# Patient Record
Sex: Female | Born: 1975 | Race: Black or African American | Hispanic: No | Marital: Single | Smoking: Current every day smoker
Health system: Southern US, Community
[De-identification: ages and names within clinical notes are randomized; demographics above are authoritative.]

## PROBLEM LIST (undated history)

## (undated) DIAGNOSIS — M1990 Unspecified osteoarthritis, unspecified site: Secondary | ICD-10-CM

## (undated) DIAGNOSIS — G43909 Migraine, unspecified, not intractable, without status migrainosus: Secondary | ICD-10-CM

## (undated) DIAGNOSIS — R42 Dizziness and giddiness: Secondary | ICD-10-CM

## (undated) DIAGNOSIS — I10 Essential (primary) hypertension: Secondary | ICD-10-CM

## (undated) DIAGNOSIS — D869 Sarcoidosis, unspecified: Secondary | ICD-10-CM

## (undated) DIAGNOSIS — K219 Gastro-esophageal reflux disease without esophagitis: Secondary | ICD-10-CM

## (undated) DIAGNOSIS — D509 Iron deficiency anemia, unspecified: Secondary | ICD-10-CM

## (undated) DIAGNOSIS — K5900 Constipation, unspecified: Secondary | ICD-10-CM

## (undated) HISTORY — PX: OTHER SURGICAL HISTORY: SHX169

## (undated) HISTORY — DX: Iron deficiency anemia, unspecified: D50.9

## (undated) HISTORY — DX: Gastro-esophageal reflux disease without esophagitis: K21.9

## (undated) HISTORY — PX: KNEE SURGERY: SHX244

## (undated) HISTORY — PX: TUBAL LIGATION: SHX77

## (undated) HISTORY — DX: Unspecified osteoarthritis, unspecified site: M19.90

## (undated) HISTORY — DX: Constipation, unspecified: K59.00

---

## 2001-11-28 ENCOUNTER — Inpatient Hospital Stay (HOSPITAL_COMMUNITY): Admission: AD | Admit: 2001-11-28 | Discharge: 2001-11-28 | Payer: Self-pay | Admitting: Obstetrics

## 2001-12-06 ENCOUNTER — Encounter: Payer: Self-pay | Admitting: Obstetrics & Gynecology

## 2001-12-06 ENCOUNTER — Emergency Department (HOSPITAL_COMMUNITY): Admission: EM | Admit: 2001-12-06 | Discharge: 2001-12-06 | Payer: Self-pay | Admitting: Emergency Medicine

## 2001-12-10 ENCOUNTER — Emergency Department (HOSPITAL_COMMUNITY): Admission: EM | Admit: 2001-12-10 | Discharge: 2001-12-10 | Payer: Self-pay | Admitting: Emergency Medicine

## 2001-12-10 ENCOUNTER — Encounter: Payer: Self-pay | Admitting: Emergency Medicine

## 2004-05-26 ENCOUNTER — Emergency Department (HOSPITAL_COMMUNITY): Admission: EM | Admit: 2004-05-26 | Discharge: 2004-05-26 | Payer: Self-pay | Admitting: Family Medicine

## 2005-02-07 ENCOUNTER — Emergency Department (HOSPITAL_COMMUNITY): Admission: EM | Admit: 2005-02-07 | Discharge: 2005-02-07 | Payer: Self-pay | Admitting: Family Medicine

## 2005-07-08 ENCOUNTER — Emergency Department (HOSPITAL_COMMUNITY): Admission: EM | Admit: 2005-07-08 | Discharge: 2005-07-08 | Payer: Self-pay | Admitting: Family Medicine

## 2007-09-08 ENCOUNTER — Emergency Department (HOSPITAL_COMMUNITY): Admission: EM | Admit: 2007-09-08 | Discharge: 2007-09-08 | Payer: Self-pay | Admitting: Emergency Medicine

## 2008-02-01 ENCOUNTER — Emergency Department (HOSPITAL_COMMUNITY): Admission: EM | Admit: 2008-02-01 | Discharge: 2008-02-01 | Payer: Self-pay | Admitting: Emergency Medicine

## 2008-04-19 ENCOUNTER — Emergency Department (HOSPITAL_COMMUNITY): Admission: EM | Admit: 2008-04-19 | Discharge: 2008-04-19 | Payer: Self-pay | Admitting: Emergency Medicine

## 2008-05-18 ENCOUNTER — Emergency Department (HOSPITAL_COMMUNITY): Admission: EM | Admit: 2008-05-18 | Discharge: 2008-05-18 | Payer: Self-pay | Admitting: Family Medicine

## 2008-10-31 ENCOUNTER — Emergency Department (HOSPITAL_COMMUNITY): Admission: EM | Admit: 2008-10-31 | Discharge: 2008-10-31 | Payer: Self-pay | Admitting: Family Medicine

## 2009-03-28 ENCOUNTER — Emergency Department (HOSPITAL_COMMUNITY): Admission: EM | Admit: 2009-03-28 | Discharge: 2009-03-28 | Payer: Self-pay | Admitting: Emergency Medicine

## 2009-06-02 ENCOUNTER — Emergency Department (HOSPITAL_COMMUNITY): Admission: EM | Admit: 2009-06-02 | Discharge: 2009-06-02 | Payer: Self-pay | Admitting: Emergency Medicine

## 2009-08-14 ENCOUNTER — Emergency Department (HOSPITAL_COMMUNITY): Admission: EM | Admit: 2009-08-14 | Discharge: 2009-08-14 | Payer: Self-pay | Admitting: Emergency Medicine

## 2009-10-20 ENCOUNTER — Emergency Department (HOSPITAL_COMMUNITY): Admission: EM | Admit: 2009-10-20 | Discharge: 2009-10-20 | Payer: Self-pay | Admitting: Emergency Medicine

## 2009-10-25 ENCOUNTER — Emergency Department (HOSPITAL_COMMUNITY): Admission: EM | Admit: 2009-10-25 | Discharge: 2009-10-25 | Payer: Self-pay | Admitting: Emergency Medicine

## 2010-01-31 ENCOUNTER — Emergency Department (HOSPITAL_COMMUNITY): Admission: EM | Admit: 2010-01-31 | Discharge: 2010-01-31 | Payer: Self-pay | Admitting: Emergency Medicine

## 2010-03-31 ENCOUNTER — Emergency Department (HOSPITAL_COMMUNITY): Admission: EM | Admit: 2010-03-31 | Discharge: 2010-03-31 | Payer: Self-pay | Admitting: Family Medicine

## 2011-02-22 LAB — RAPID URINE DRUG SCREEN, HOSP PERFORMED: Tetrahydrocannabinol: NOT DETECTED

## 2011-02-22 LAB — URINALYSIS, ROUTINE W REFLEX MICROSCOPIC
Bilirubin Urine: NEGATIVE
Ketones, ur: NEGATIVE mg/dL
Nitrite: NEGATIVE
Urobilinogen, UA: 1 mg/dL (ref 0.0–1.0)

## 2011-02-22 LAB — POCT I-STAT, CHEM 8
Calcium, Ion: 1.21 mmol/L (ref 1.12–1.32)
Chloride: 100 mEq/L (ref 96–112)
HCT: 33 % — ABNORMAL LOW (ref 36.0–46.0)
Hemoglobin: 11.2 g/dL — ABNORMAL LOW (ref 12.0–15.0)
Potassium: 4 mEq/L (ref 3.5–5.1)

## 2011-02-22 LAB — POCT PREGNANCY, URINE: Preg Test, Ur: NEGATIVE

## 2011-02-23 LAB — URINALYSIS, ROUTINE W REFLEX MICROSCOPIC
Nitrite: NEGATIVE
Protein, ur: 30 mg/dL — AB
Specific Gravity, Urine: 1.029 (ref 1.005–1.030)
Urobilinogen, UA: 1 mg/dL (ref 0.0–1.0)

## 2011-02-23 LAB — URINE MICROSCOPIC-ADD ON

## 2011-02-23 LAB — POCT PREGNANCY, URINE: Preg Test, Ur: NEGATIVE

## 2011-02-25 LAB — COMPREHENSIVE METABOLIC PANEL
Alkaline Phosphatase: 88 U/L (ref 39–117)
BUN: 4 mg/dL — ABNORMAL LOW (ref 6–23)
CO2: 24 mEq/L (ref 19–32)
Chloride: 106 mEq/L (ref 96–112)
Creatinine, Ser: 0.62 mg/dL (ref 0.4–1.2)
GFR calc non Af Amer: >60 mL/min (ref >60)
Glucose, Bld: 97 mg/dL (ref 70–99)
Potassium: 3.7 mEq/L (ref 3.5–5.1)
Total Bilirubin: 0.7 mg/dL (ref 0.3–1.2)

## 2011-02-25 LAB — DIFFERENTIAL
Basophils Absolute: 0 10*3/uL (ref 0.0–0.1)
Basophils Relative: 1 % (ref 0–1)
Lymphocytes Relative: 44 % (ref 12–46)
Neutro Abs: 1.9 10*3/uL (ref 1.7–7.7)
Neutrophils Relative %: 45 % (ref 43–77)

## 2011-02-25 LAB — CBC
HCT: 35.8 % — ABNORMAL LOW (ref 36.0–46.0)
Hemoglobin: 11.8 g/dL — ABNORMAL LOW (ref 12.0–15.0)
MCV: 90.1 fL (ref 78.0–100.0)
Platelets: 269 10*3/uL (ref 150–400)
WBC: 4.3 10*3/uL (ref 4.0–10.5)

## 2011-02-25 LAB — LIPASE, BLOOD: Lipase: 36 U/L (ref 11–59)

## 2011-03-01 LAB — POCT URINALYSIS DIP (DEVICE)
Glucose, UA: NEGATIVE mg/dL
Nitrite: NEGATIVE
Protein, ur: NEGATIVE mg/dL
Specific Gravity, Urine: 1.01 (ref 1.005–1.030)
Urobilinogen, UA: 1 mg/dL (ref 0.0–1.0)

## 2011-03-01 LAB — POCT PREGNANCY, URINE: Preg Test, Ur: NEGATIVE

## 2011-03-01 LAB — GC/CHLAMYDIA PROBE AMP, GENITAL
Chlamydia, DNA Probe: NEGATIVE
GC Probe Amp, Genital: NEGATIVE

## 2011-03-01 LAB — WET PREP, GENITAL
Trich, Wet Prep: NONE SEEN
Yeast Wet Prep HPF POC: NONE SEEN

## 2011-05-06 ENCOUNTER — Inpatient Hospital Stay (INDEPENDENT_AMBULATORY_CARE_PROVIDER_SITE_OTHER)
Admission: RE | Admit: 2011-05-06 | Discharge: 2011-05-06 | Disposition: A | Discharge status: Home / Self Care | Payer: Self-pay | Source: Ambulatory Visit | Attending: Family Medicine | Admitting: Family Medicine

## 2011-05-06 DIAGNOSIS — J40 Bronchitis, not specified as acute or chronic: Secondary | ICD-10-CM

## 2011-07-04 ENCOUNTER — Inpatient Hospital Stay (INDEPENDENT_AMBULATORY_CARE_PROVIDER_SITE_OTHER)
Admission: RE | Admit: 2011-07-04 | Discharge: 2011-07-04 | Disposition: A | Discharge status: Home / Self Care | Payer: Self-pay | Source: Ambulatory Visit | Attending: Emergency Medicine | Admitting: Emergency Medicine

## 2011-07-04 DIAGNOSIS — S90129A Contusion of unspecified lesser toe(s) without damage to nail, initial encounter: Secondary | ICD-10-CM

## 2011-07-04 DIAGNOSIS — S91109A Unspecified open wound of unspecified toe(s) without damage to nail, initial encounter: Secondary | ICD-10-CM

## 2011-07-15 ENCOUNTER — Inpatient Hospital Stay (HOSPITAL_COMMUNITY)
Admission: RE | Admit: 2011-07-15 | Discharge: 2011-07-15 | Disposition: A | Discharge status: Home / Self Care | Payer: Self-pay | Source: Ambulatory Visit | Attending: Family Medicine | Admitting: Family Medicine

## 2011-08-15 LAB — I-STAT 8, (EC8 V) (CONVERTED LAB)
Chloride: 105
HCT: 37
Hemoglobin: 12.6
Potassium: 3.4 — ABNORMAL LOW
Sodium: 140
TCO2: 25

## 2011-08-15 LAB — CBC
HCT: 32.8 — ABNORMAL LOW
Hemoglobin: 10.7 — ABNORMAL LOW
MCHC: 32.8
RBC: 3.79 — ABNORMAL LOW
RDW: 12.4

## 2011-08-15 LAB — URINALYSIS, ROUTINE W REFLEX MICROSCOPIC
Bilirubin Urine: NEGATIVE
Leukocytes, UA: NEGATIVE
Nitrite: NEGATIVE
Specific Gravity, Urine: 1.023
Urobilinogen, UA: 1

## 2011-08-15 LAB — DIFFERENTIAL
Eosinophils Relative: 0
Lymphocytes Relative: 25
Monocytes Absolute: 0.5
Monocytes Relative: 8
Neutro Abs: 4

## 2011-08-15 LAB — OCCULT BLOOD X 1 CARD TO LAB, STOOL: Fecal Occult Bld: NEGATIVE

## 2011-08-15 LAB — POCT I-STAT CREATININE
Creatinine, Ser: 0.8
Operator id: 270651

## 2011-08-15 LAB — URINE MICROSCOPIC-ADD ON

## 2011-08-15 LAB — HEPATIC FUNCTION PANEL
Albumin: 3.6
Alkaline Phosphatase: 83
Indirect Bilirubin: 0.6
Total Protein: 7.7

## 2011-08-15 LAB — LIPASE, BLOOD: Lipase: 35

## 2011-08-17 LAB — URINALYSIS, ROUTINE W REFLEX MICROSCOPIC
Bilirubin Urine: NEGATIVE
Ketones, ur: 15 — AB
Leukocytes, UA: NEGATIVE
Nitrite: NEGATIVE
Protein, ur: NEGATIVE
Urobilinogen, UA: 1

## 2011-08-18 LAB — URINALYSIS, ROUTINE W REFLEX MICROSCOPIC
Bilirubin Urine: NEGATIVE
Glucose, UA: NEGATIVE
Ketones, ur: 15 — AB
Specific Gravity, Urine: 1.02
pH: 6

## 2011-08-18 LAB — RAPID URINE DRUG SCREEN, HOSP PERFORMED
Amphetamines: NOT DETECTED
Barbiturates: NOT DETECTED
Opiates: NOT DETECTED
Tetrahydrocannabinol: NOT DETECTED

## 2011-08-18 LAB — URINE MICROSCOPIC-ADD ON

## 2011-08-31 LAB — POCT URINALYSIS DIP (DEVICE)
Ketones, ur: 15 — AB
Nitrite: NEGATIVE
Operator id: 270961
Protein, ur: NEGATIVE
pH: 6.5

## 2011-08-31 LAB — POCT PREGNANCY, URINE
Operator id: 282151
Preg Test, Ur: NEGATIVE

## 2012-01-23 ENCOUNTER — Emergency Department (HOSPITAL_COMMUNITY)
Admission: EM | Admit: 2012-01-23 | Discharge: 2012-01-23 | Disposition: A | Discharge status: Home Health | Payer: Medicaid Other | Source: Home / Self Care | Attending: Family Medicine | Admitting: Family Medicine

## 2012-01-23 ENCOUNTER — Encounter (HOSPITAL_COMMUNITY): Payer: Self-pay | Admitting: Emergency Medicine

## 2012-01-23 DIAGNOSIS — J069 Acute upper respiratory infection, unspecified: Secondary | ICD-10-CM

## 2012-01-23 NOTE — ED Notes (Signed)
PT HERE WITH COLD/COUGH SX THAT STARTED LAST Friday.COUGH,VOMTING,SNEEZING AND DIARRHEA THAT HAS RESOLVED.AFEBRILE.PT TAKING OTC ALKA SELTZER

## 2012-01-23 NOTE — Discharge Instructions (Signed)
Drink plenty of fluids as discussed, use mucinex or delsym for cough. Return or see your doctor if further problems °

## 2012-01-23 NOTE — ED Provider Notes (Signed)
History     CSN: [id number]  Arrival date & time 01/23/12  1352   First MD Initiated Contact with Patient 01/23/12 1457      Chief Complaint  Patient presents with  . URI    (Consider location/radiation/quality/duration/timing/severity/associated sxs/prior treatment) Patient is a 36 y.o. female presenting with URI. The history is provided by the patient.  URI The primary symptoms include sore throat, cough, nausea and vomiting. The current episode started 3 to 5 days ago. This is a new problem. The problem has been gradually improving.  The onset of the illness is associated with exposure to sick contacts. Symptoms associated with the illness include congestion and rhinorrhea.    History reviewed. No pertinent past medical history.  Past Surgical History  Procedure Date  . Cesarean section     No family history on file.  History  Substance Use Topics  . Smoking status: Current Everyday Smoker  . Smokeless tobacco: Not on file  . Alcohol Use: No    OB History    Grav Para Term Preterm Abortions TAB SAB Ect Mult Living                  Review of Systems  Constitutional: Negative.   HENT: Positive for congestion, sore throat and rhinorrhea.   Respiratory: Positive for cough.   Gastrointestinal: Positive for nausea, vomiting and diarrhea.  Genitourinary: Negative.     Allergies  Naprosyn  Home Medications  No current outpatient prescriptions on file.  BP 119/77  Pulse 60  Temp(Src) 98 F (36.7 C) (Oral)  Resp 18  SpO2 99%  Physical Exam  Nursing note and vitals reviewed. Constitutional: She is oriented to person, place, and time. She appears well-developed and well-nourished.  HENT:  Head: Normocephalic.  Right Ear: External ear normal.  Left Ear: External ear normal.  Mouth/Throat: Oropharynx is clear and moist.  Eyes: Pupils are equal, round, and reactive to light.  Neck: Normal range of motion. Neck supple.  Cardiovascular: Normal rate and  normal heart sounds.   Pulmonary/Chest: Breath sounds normal.  Abdominal: Soft. Bowel sounds are normal.  Lymphadenopathy:    She has no cervical adenopathy.  Neurological: She is alert and oriented to person, place, and time.  Skin: Skin is warm.    ED Course  Procedures (including critical care time)  Labs Reviewed - No data to display No results found.   1. URI (upper respiratory infection)       MDM          Barkley Bruns, MD 01/23/12 1535

## 2012-03-12 ENCOUNTER — Encounter (HOSPITAL_COMMUNITY): Payer: Self-pay | Admitting: *Deleted

## 2012-03-12 ENCOUNTER — Emergency Department (HOSPITAL_COMMUNITY)
Admission: EM | Admit: 2012-03-12 | Discharge: 2012-03-12 | Disposition: A | Discharge status: Home / Self Care | Payer: Medicaid Other | Attending: Emergency Medicine | Admitting: Emergency Medicine

## 2012-03-12 DIAGNOSIS — S40269A Insect bite (nonvenomous) of unspecified shoulder, initial encounter: Secondary | ICD-10-CM | POA: Insufficient documentation

## 2012-03-12 DIAGNOSIS — F17200 Nicotine dependence, unspecified, uncomplicated: Secondary | ICD-10-CM | POA: Insufficient documentation

## 2012-03-12 DIAGNOSIS — W57XXXA Bitten or stung by nonvenomous insect and other nonvenomous arthropods, initial encounter: Secondary | ICD-10-CM | POA: Insufficient documentation

## 2012-03-12 DIAGNOSIS — S90569A Insect bite (nonvenomous), unspecified ankle, initial encounter: Secondary | ICD-10-CM | POA: Insufficient documentation

## 2012-03-12 DIAGNOSIS — IMO0001 Reserved for inherently not codable concepts without codable children: Secondary | ICD-10-CM | POA: Insufficient documentation

## 2012-03-12 MED ORDER — DIPHENHYDRAMINE HCL 25 MG PO CAPS
25.0000 mg | ORAL_CAPSULE | Freq: Once | ORAL | Status: AC
  Administered 2012-03-12: 25 mg via ORAL
  Filled 2012-03-12: qty 1

## 2012-03-12 NOTE — ED Notes (Signed)
To ED for eval of multiple areas with noted raised areas to body. States she is staying in local hotel.

## 2012-03-12 NOTE — ED Provider Notes (Signed)
History     CSN: [id number]  Arrival date & time 03/12/12  1306   First MD Initiated Contact with Patient 03/12/12 1436      Chief Complaint  Patient presents with  . Abscess     The history is provided by the patient.   the patient reports developing several small raised red areas to her left wrist left shoulder and left calf over the past several days since staying at a new hotel.  She reports these areas it.  She denies spreading erythema.  She has no fevers or chills.  She's had no drainage from these areas.  She's not tried anything for her symptoms.  Her symptoms are mild in severity.  Nothing worsens her symptoms.  Nothing improves her symptoms.  Symptoms are constant.  History reviewed. No pertinent past medical history.  Past Surgical History  Procedure Date  . Cesarean section     History reviewed. No pertinent family history.  History  Substance Use Topics  . Smoking status: Current Everyday Smoker  . Smokeless tobacco: Not on file  . Alcohol Use: No    OB History    Grav Para Term Preterm Abortions TAB SAB Ect Mult Living                  Review of Systems  All other systems reviewed and are negative.    Allergies  Naprosyn  Home Medications   Current Outpatient Rx  Name Route Sig Dispense Refill  . ASPIRIN-ACETAMINOPHEN-CAFFEINE 250-250-65 MG PO TABS Oral Take 1 tablet by mouth every 6 (six) hours as needed. For head aches      BP 137/82  Pulse 75  Temp(Src) 97.6 F (36.4 C) (Oral)  Resp 16  SpO2 100%  Physical Exam  Constitutional: She is oriented to person, place, and time. She appears well-developed and well-nourished.  HENT:  Head: Normocephalic.  Eyes: EOM are normal.  Neck: Normal range of motion.  Pulmonary/Chest: Effort normal.  Musculoskeletal: Normal range of motion.  Neurological: She is alert and oriented to person, place, and time.  Skin:       The patient was several small red raised areas consistent with insect bites.   There is no spreading erythema or fluctuance to these areas.  These areas include the left dorsal wrist, the left posterior shoulder, left medial calf.  Psychiatric: She has a normal mood and affect.    ED Course  Procedures (including critical care time)  Labs Reviewed - No data to display No results found.   1. Insect bite       MDM  The areas appear consistent with insect bites.  There is a secondary signs of infection.  Benadryl for the itch.  I recommended that the patient find a new hotel or to ask the staff to send an exterminator.      Lyanne Co, MD 03/12/12 [phone]

## 2012-03-12 NOTE — Discharge Instructions (Signed)
Insect Bite Mosquitoes, flies, fleas, bedbugs, and many other insects can bite. Insect bites are different from insect stings. A sting is when venom is injected into the skin. Some insect bites can transmit infectious diseases. SYMPTOMS  Insect bites usually turn red, swell, and itch for 2 to 4 days. They often go away on their own. TREATMENT  Your caregiver may prescribe antibiotic medicines if a bacterial infection develops in the bite. HOME CARE INSTRUCTIONS  Do not scratch the bite area.   Keep the bite area clean and dry. Wash the bite area thoroughly with soap and water.   Put ice or cool compresses on the bite area.   Put ice in a plastic bag.   Place a towel between your skin and the bag.   Leave the ice on for 20 minutes, 4 times a day for the first 2 to 3 days, or as directed.   You may apply a baking soda paste, cortisone cream, or calamine lotion to the bite area as directed by your caregiver. This can help reduce itching and swelling.   Only take over-the-counter or prescription medicines as directed by your caregiver.   If you are given antibiotics, take them as directed. Finish them even if you start to feel better.  You may need a tetanus shot if:  You cannot remember when you had your last tetanus shot.   You have never had a tetanus shot.   The injury broke your skin.  If you get a tetanus shot, your arm may swell, get red, and feel warm to the touch. This is common and not a problem. If you need a tetanus shot and you choose not to have one, there is a rare chance of getting tetanus. Sickness from tetanus can be serious. SEEK IMMEDIATE MEDICAL CARE IF:   You have increased pain, redness, or swelling in the bite area.   You see a red line on the skin coming from the bite.   You have a fever.   You have joint pain.   You have a headache or neck pain.   You have unusual weakness.   You have a rash.   You have chest pain or shortness of breath.   You  have abdominal pain, nausea, or vomiting.   You feel unusually tired or sleepy.  MAKE SURE YOU:   Understand these instructions.   Will watch your condition.   Will get help right away if you are not doing well or get worse.  Document Released: 12/15/2004 Document Revised: 10/27/2011 Document Reviewed: 06/08/2011 [employer] Patient Information 2012 [employer]. 

## 2012-07-08 ENCOUNTER — Encounter (HOSPITAL_COMMUNITY): Payer: Self-pay | Admitting: Emergency Medicine

## 2012-07-08 ENCOUNTER — Emergency Department (HOSPITAL_COMMUNITY)
Admission: EM | Admit: 2012-07-08 | Discharge: 2012-07-08 | Disposition: A | Discharge status: Home / Self Care | Payer: Medicaid Other | Attending: Emergency Medicine | Admitting: Emergency Medicine

## 2012-07-08 DIAGNOSIS — K859 Acute pancreatitis without necrosis or infection, unspecified: Secondary | ICD-10-CM

## 2012-07-08 DIAGNOSIS — F17200 Nicotine dependence, unspecified, uncomplicated: Secondary | ICD-10-CM | POA: Insufficient documentation

## 2012-07-08 DIAGNOSIS — R112 Nausea with vomiting, unspecified: Secondary | ICD-10-CM | POA: Insufficient documentation

## 2012-07-08 DIAGNOSIS — R109 Unspecified abdominal pain: Secondary | ICD-10-CM | POA: Insufficient documentation

## 2012-07-08 HISTORY — DX: Migraine, unspecified, not intractable, without status migrainosus: G43.909

## 2012-07-08 LAB — URINALYSIS, ROUTINE W REFLEX MICROSCOPIC
Bilirubin Urine: NEGATIVE
Glucose, UA: NEGATIVE mg/dL
Hgb urine dipstick: NEGATIVE
Ketones, ur: NEGATIVE mg/dL
Leukocytes, UA: NEGATIVE
Protein, ur: NEGATIVE mg/dL
pH: 7 (ref 5.0–8.0)

## 2012-07-08 LAB — CBC
HCT: 35.7 % — ABNORMAL LOW (ref 36.0–46.0)
Hemoglobin: 11.6 g/dL — ABNORMAL LOW (ref 12.0–15.0)
MCH: 28.4 pg (ref 26.0–34.0)
MCHC: 32.5 g/dL (ref 30.0–36.0)
MCV: 87.3 fL (ref 78.0–100.0)
RBC: 4.09 MIL/uL (ref 3.87–5.11)

## 2012-07-08 LAB — COMPREHENSIVE METABOLIC PANEL
ALT: 22 U/L (ref 0–35)
AST: 62 U/L — ABNORMAL HIGH (ref 0–37)
Albumin: 3.5 g/dL (ref 3.5–5.2)
CO2: 23 mEq/L (ref 19–32)
Calcium: 9.3 mg/dL (ref 8.4–10.5)
Creatinine, Ser: 0.66 mg/dL (ref 0.50–1.10)
Sodium: 138 mEq/L (ref 135–145)
Total Protein: 7.7 g/dL (ref 6.0–8.3)

## 2012-07-08 LAB — AMYLASE: Amylase: 110 U/L — ABNORMAL HIGH (ref 0–105)

## 2012-07-08 MED ORDER — PROMETHAZINE HCL 25 MG PO TABS: 25.0000 mg | ORAL_TABLET | Freq: Four times a day (QID) | ORAL | Status: DC | PRN

## 2012-07-08 MED ORDER — OXYCODONE-ACETAMINOPHEN 5-325 MG PO TABS: 1.0000 | ORAL_TABLET | Freq: Four times a day (QID) | ORAL | Status: AC | PRN

## 2012-07-08 MED ORDER — ONDANSETRON HCL 4 MG/2ML IJ SOLN
4.0000 mg | Freq: Once | INTRAMUSCULAR | Status: AC
  Administered 2012-07-08: 4 mg via INTRAVENOUS
  Filled 2012-07-08: qty 2

## 2012-07-08 MED ORDER — MORPHINE SULFATE 4 MG/ML IJ SOLN
4.0000 mg | Freq: Once | INTRAMUSCULAR | Status: AC
  Administered 2012-07-08: 4 mg via INTRAVENOUS
  Filled 2012-07-08: qty 1

## 2012-07-08 MED ORDER — SODIUM CHLORIDE 0.9 % IV BOLUS (SEPSIS)
1000.0000 mL | Freq: Once | INTRAVENOUS | Status: AC
  Administered 2012-07-08: 1000 mL via INTRAVENOUS

## 2012-07-08 NOTE — ED Notes (Signed)
Patient was brought in by [facility]. Patient advises she had some fish earlier today and has been throwing up since 1900hrs.  She complains of upper abdominal discomfort, however no diarrhea.  Patient says she has been throwing up food.  BP 134 palpated, HR 88, NSR.  EMS gave patient 4mg  of Zofran through a 22 gauge PIV in her left hand.

## 2012-07-08 NOTE — ED Provider Notes (Signed)
History     CSN: [id number]  Arrival date & time 07/08/12  0113   First MD Initiated Contact with Patient 07/08/12 0120      Chief Complaint  Patient presents with  . Abdominal Pain  . Emesis  . Nausea    (Consider location/radiation/quality/duration/timing/severity/associated sxs/prior treatment) HPI Comments: [name] presents via EMS for evaluation of abdominal pain.  She reports eating a dinner consisting of fried whiting that was prepared by her sister.  Around 1900 she started to experience strong abdominal cramping and nausea.  She reports having a large loose bowel movement also.  Since then she has had 3 or 4 other loose BMs.  She developed intense nausea about an hr ago and decided to come to the ER.  She reports 1 episode of nonbloody, nonbilious emesis.  None of the other family members that consumed the same meal are ill.  She denies any known sick contacts.  She report feeling short of breath but denies fevers, chills, HA, ST, CP, cough, melena, hematochezia, hematemesis, or any neurologic complaints.  Patient is a 36 y.o. female presenting with abdominal pain and vomiting.  Abdominal Pain The primary symptoms of the illness include abdominal pain, shortness of breath, nausea, vomiting and diarrhea. The primary symptoms of the illness do not include fever, fatigue or dysuria. The current episode started 3 to 5 hours ago. The onset of the illness was gradual. The problem has been gradually worsening.  The illness is associated with retching. The patient states that she believes she is currently not pregnant. The patient has not had a change in bowel habit. Additional symptoms associated with the illness include chills, anorexia and diaphoresis. Symptoms associated with the illness do not include heartburn, constipation, urgency, hematuria, frequency or back pain.  Emesis  Associated symptoms include abdominal pain, chills and diarrhea. Pertinent negatives include no  arthralgias, no cough, no fever and no myalgias.    No past medical history on file.  Past Surgical History  Procedure Date  . Cesarean section     No family history on file.  History  Substance Use Topics  . Smoking status: Current Everyday Smoker  . Smokeless tobacco: Not on file  . Alcohol Use: No    OB History    Grav Para Term Preterm Abortions TAB SAB Ect Mult Living                  Review of Systems  Constitutional: Positive for chills and diaphoresis. Negative for fever, activity change and fatigue.  HENT: Negative for congestion, sore throat, rhinorrhea, trouble swallowing, neck pain, neck stiffness and sinus pressure.   Eyes: Negative.   Respiratory: Positive for shortness of breath. Negative for cough, chest tightness, wheezing and stridor.   Cardiovascular: Negative for chest pain, palpitations and leg swelling.  Gastrointestinal: Positive for nausea, vomiting, abdominal pain, diarrhea and anorexia. Negative for heartburn, constipation, blood in stool, abdominal distention, anal bleeding and rectal pain.  Genitourinary: Negative for dysuria, urgency, frequency and hematuria.  Musculoskeletal: Negative for myalgias, back pain and arthralgias.  Skin: Negative.   Neurological: Negative.   Psychiatric/Behavioral: Negative.     Allergies  Naprosyn  Home Medications   Current Outpatient Rx  Name Route Sig Dispense Refill  . ASPIRIN-ACETAMINOPHEN-CAFFEINE 250-250-65 MG PO TABS Oral Take 1 tablet by mouth every 6 (six) hours as needed. For head aches      BP 145/93  Pulse 81  Temp 98.4 F (36.9 C) (Oral)  Resp 20  SpO2 100%  Physical Exam  Constitutional: She appears well-developed and well-nourished. She appears distressed.       Pt appears uncomfortable, sitting upright rocking in stretcher.  Occasionally retching.  HENT:  Head: Normocephalic and atraumatic.  Right Ear: External ear normal.  Left Ear: External ear normal.  Nose: Nose normal.    Mouth/Throat: Oropharynx is clear and moist. No oropharyngeal exudate.  Eyes: Conjunctivae and EOM are normal. Pupils are equal, round, and reactive to light. Right eye exhibits no discharge. Left eye exhibits no discharge. No scleral icterus.  Neck: Normal range of motion. Neck supple. No JVD present. No tracheal deviation present. No thyromegaly present.  Cardiovascular: Normal rate, normal heart sounds and intact distal pulses.  Exam reveals no gallop and no friction rub.   No murmur heard. Pulmonary/Chest: Effort normal and breath sounds normal. No stridor. No respiratory distress. She has no wheezes. She has no rales. She exhibits no tenderness.  Abdominal: Soft. Bowel sounds are normal. She exhibits no distension and no mass. There is tenderness. There is no rebound and no guarding.       Very vague and mild diffuse.  Exam was completed after medications were administered for pt comfort.  Lymphadenopathy:    She has no cervical adenopathy.  Skin: Skin is warm and dry. No rash noted. She is not diaphoretic. No erythema. No pallor.  Psychiatric: Her behavior is normal. Thought content normal. Her mood appears anxious. Her affect is not angry, not blunt, not labile and not inappropriate. Her speech is not rapid and/or pressured, not delayed, not tangential and not slurred. Cognition and memory are normal. Cognition and memory are not impaired. She does not express impulsivity or inappropriate judgment. She does not exhibit a depressed mood. She is communicative. She exhibits normal recent memory and normal remote memory.    ED Course  Procedures (including critical care time)   Labs Reviewed  COMPREHENSIVE METABOLIC PANEL  CBC  URINALYSIS, ROUTINE W REFLEX MICROSCOPIC  PREGNANCY, URINE  LIPASE, BLOOD   No results found.   No diagnosis found.    MDM  Pt presents for evaluation of abdominal pain and NVD.  She appears uncomfortable but has stable VS.  Her discomfort has limited her  exam as she is unable to lay flat for evaluation of her abdomen.  Will administer zofran, morphine, and IVF.  Belly labs ordered.  Will repeat exam once she is more comfortable and add diagnostic studies to the evaluation if necessary.  At this time her symptoms appear to be secondary to a gastroenteritis.    0310.  Exam repeated.  Pt is pain free.  Labs pending.  Will continue to monitor.  0532.  Pt is pain free.  Note elevated amylase and lipase.  She denies EtOH abuse or other risk factors for acute pancreatitis.  Tbili is not elevated and there is no reproducible RUQ tenderness.  Plan at this time is symptomatic care.  Pt advised to only consume clears for the next 48 hours and then advance her diet slowly from there.  If however she has worsening abdominal pain, she has been advised to return immediately to the emergency department.      Tobin Chad, MD 07/08/12 [phone]

## 2012-07-08 NOTE — ED Notes (Signed)
Pt unable to void at this time. 

## 2012-10-01 ENCOUNTER — Encounter (HOSPITAL_COMMUNITY): Payer: Self-pay | Admitting: *Deleted

## 2012-10-01 ENCOUNTER — Emergency Department (HOSPITAL_COMMUNITY)
Admission: EM | Admit: 2012-10-01 | Discharge: 2012-10-01 | Disposition: A | Discharge status: Home / Self Care | Payer: Self-pay | Attending: Emergency Medicine | Admitting: Emergency Medicine

## 2012-10-01 DIAGNOSIS — Y929 Unspecified place or not applicable: Secondary | ICD-10-CM | POA: Insufficient documentation

## 2012-10-01 DIAGNOSIS — Y939 Activity, unspecified: Secondary | ICD-10-CM | POA: Insufficient documentation

## 2012-10-01 DIAGNOSIS — T148 Other injury of unspecified body region: Secondary | ICD-10-CM | POA: Insufficient documentation

## 2012-10-01 DIAGNOSIS — Z8669 Personal history of other diseases of the nervous system and sense organs: Secondary | ICD-10-CM | POA: Insufficient documentation

## 2012-10-01 DIAGNOSIS — F17200 Nicotine dependence, unspecified, uncomplicated: Secondary | ICD-10-CM | POA: Insufficient documentation

## 2012-10-01 DIAGNOSIS — W57XXXA Bitten or stung by nonvenomous insect and other nonvenomous arthropods, initial encounter: Secondary | ICD-10-CM | POA: Insufficient documentation

## 2012-10-01 MED ORDER — PREDNISONE 20 MG PO TABS: ORAL_TABLET | ORAL | Status: DC

## 2012-10-01 MED ORDER — DIPHENHYDRAMINE HCL 25 MG PO TABS: 50.0000 mg | ORAL_TABLET | ORAL | Status: DC | PRN

## 2012-10-01 MED ORDER — HYDROCODONE-ACETAMINOPHEN 5-325 MG PO TABS: 2.0000 | ORAL_TABLET | ORAL | Status: DC | PRN

## 2012-10-01 MED ORDER — FAMOTIDINE 20 MG PO TABS: 20.0000 mg | ORAL_TABLET | Freq: Two times a day (BID) | ORAL | Status: DC

## 2012-10-01 MED ORDER — LORATADINE 10 MG PO TABS: 10.0000 mg | ORAL_TABLET | Freq: Every day | ORAL | Status: DC

## 2012-10-01 NOTE — ED Provider Notes (Signed)
History   This chart was scribed for Hurman Horn, MD by Gerlean Ren, ED Scribe. This patient was seen in room TR03C/TR03C and the patient's care was started at 4:50 PM    CSN: [id number]  Arrival date & time 10/01/12  1633   First MD Initiated Contact with Patient 10/01/12 1649      No chief complaint on file.   (Consider location/radiation/quality/duration/timing/severity/associated sxs/prior treatment) The history is provided by the patient. No language interpreter was used.   [name] is a 36 y.o. female who presents to the Emergency Department complaining of constant, non-worsening itching bug bites over all 4 extremities from suspected bedbugs.  Pt denies any confusion, fever, cough, nausea, emesis, weakness, hallucinations as associated.  Pt has no h/o chronic medical conditions.  Pt is a current everyday smoker but denies alcohol use.   Past Medical History  Diagnosis Date  . Migraine     Past Surgical History  Procedure Date  . Cesarean section   . Knee surgery     No family history on file.  History  Substance Use Topics  . Smoking status: Current Every Day Smoker -- 1.0 packs/day  . Smokeless tobacco: Not on file  . Alcohol Use: No    No OB history provided.  Review of Systems 10 Systems reviewed and are negative for acute change except as noted in the HPI.  Allergies  Naprosyn  Home Medications   Current Outpatient Rx  Name  Route  Sig  Dispense  Refill  . BISMUTH SUBSALICYLATE 262 MG/15ML PO SUSP   Oral   Take 15 mLs by mouth every 6 (six) hours as needed. For stomach upset         . DIPHENHYDRAMINE HCL 25 MG PO TABS   Oral   Take 2 tablets (50 mg total) by mouth every 4 (four) hours as needed for itching.   20 tablet   0   . FAMOTIDINE 20 MG PO TABS   Oral   Take 1 tablet (20 mg total) by mouth 2 (two) times daily.   10 tablet   0   . HYDROCODONE-ACETAMINOPHEN 5-325 MG PO TABS   Oral   Take 2 tablets by mouth every 4 (four)  hours as needed for pain.   6 tablet   0   . ADVIL PM PO   Oral   Take 2 tablets by mouth at bedtime as needed. For pain         . LORATADINE 10 MG PO TABS   Oral   Take 1 tablet (10 mg total) by mouth daily. One po daily x 5 days   5 tablet   0   . PREDNISONE 20 MG PO TABS      3 tabs po day one, then 2 tabs daily x 4 days   11 tablet   0   . PROMETHAZINE HCL 25 MG PO TABS   Oral   Take 1 tablet (25 mg total) by mouth every 6 (six) hours as needed for nausea.   12 tablet   0     BP 136/95  Pulse 68  Temp 97.9 F (36.6 C) (Oral)  Resp 18  SpO2 99%  LMP 09/12/2012  Physical Exam  Nursing note and vitals reviewed. Constitutional:       Awake, alert, nontoxic appearance.  HENT:  Head: Atraumatic.  Mouth/Throat: Oropharynx is clear and moist.  Eyes: Right eye exhibits no discharge. Left eye exhibits no discharge.  Neck: Neck supple.  Pulmonary/Chest: Effort normal. She has no wheezes. She exhibits no tenderness.  Abdominal: Soft. There is no tenderness. There is no rebound.  Musculoskeletal: She exhibits no tenderness.       Baseline ROM, no obvious new focal weakness.  Neurological:       Mental status and motor strength appears baseline for patient and situation.  Skin: No rash noted.       Scattered urticarial papules and plaques all less than 3cm over all 4 extremities.    Psychiatric: She has a normal mood and affect.    ED Course  Procedures (including critical care time) DIAGNOSTIC STUDIES: Oxygen Saturation is 99% on room air, normal by my interpretation.    COORDINATION OF CARE: 4:54 PM- Patient informed of clinical course, understands medical decision-making process, and agrees with plan.         Labs Reviewed - No data to display No results found.   1. Multiple insect bites       MDM  I personally performed the services described in this documentation, which was scribed in my presence. The recorded information has been  reviewed and is accurate. I doubt any other EMC precluding discharge at this time including, but not necessarily limited to the following:SBI.       Hurman Horn, MD 10/05/12 [phone]

## 2012-10-01 NOTE — ED Notes (Signed)
Pt is here with bed bug bites from bedrails that were given to her.  Pt states she woke up with the bites.  Pt actually saw bedbugs

## 2012-12-16 ENCOUNTER — Emergency Department (HOSPITAL_COMMUNITY)
Admission: EM | Admit: 2012-12-16 | Discharge: 2012-12-16 | Disposition: A | Discharge status: Home / Self Care | Payer: Self-pay | Attending: Emergency Medicine | Admitting: Emergency Medicine

## 2012-12-16 ENCOUNTER — Encounter (HOSPITAL_COMMUNITY): Payer: Self-pay | Admitting: Emergency Medicine

## 2012-12-16 DIAGNOSIS — F17200 Nicotine dependence, unspecified, uncomplicated: Secondary | ICD-10-CM | POA: Insufficient documentation

## 2012-12-16 DIAGNOSIS — R21 Rash and other nonspecific skin eruption: Secondary | ICD-10-CM | POA: Insufficient documentation

## 2012-12-16 DIAGNOSIS — Z8679 Personal history of other diseases of the circulatory system: Secondary | ICD-10-CM | POA: Insufficient documentation

## 2012-12-16 MED ORDER — TRAMADOL HCL 50 MG PO TABS: 50.0000 mg | ORAL_TABLET | Freq: Four times a day (QID) | ORAL | Status: DC | PRN

## 2012-12-16 MED ORDER — DIPHENHYDRAMINE-ZINC ACETATE 2-0.1 % EX STCK: 1.0000 "application " | Freq: Three times a day (TID) | CUTANEOUS | Status: DC | PRN

## 2012-12-16 MED ORDER — DIPHENHYDRAMINE HCL 50 MG/ML IJ SOLN
25.0000 mg | Freq: Once | INTRAMUSCULAR | Status: AC
  Administered 2012-12-16: 25 mg via INTRAMUSCULAR
  Filled 2012-12-16: qty 1

## 2012-12-16 MED ORDER — IBUPROFEN 400 MG PO TABS
800.0000 mg | ORAL_TABLET | Freq: Once | ORAL | Status: AC
  Administered 2012-12-16: 800 mg via ORAL
  Filled 2012-12-16: qty 2

## 2012-12-16 MED ORDER — FAMOTIDINE 20 MG PO TABS
40.0000 mg | ORAL_TABLET | Freq: Once | ORAL | Status: AC
  Administered 2012-12-16: 40 mg via ORAL
  Filled 2012-12-16: qty 2

## 2012-12-16 MED ORDER — HYDROXYZINE HCL 10 MG PO TABS: 10.0000 mg | ORAL_TABLET | Freq: Four times a day (QID) | ORAL | Status: DC | PRN

## 2012-12-16 NOTE — ED Notes (Signed)
PT. REPORTS INSECT BITE / ITCHY RASHES AT LEFT JAW/CHEST AND LEFT FOREARM FOR SEVERAL  DAYS.

## 2012-12-16 NOTE — ED Provider Notes (Signed)
History  This chart was scribed for non-physician practitioner working with Carrie Lennert, MD by [name], ED Scribe. This patient was seen in room TR11C/TR11C and the patient's care was started at 2002.  CSN: [id number]  Arrival date & time 12/16/12  1940   First MD Initiated Contact with Patient 12/16/12 2002      Chief Complaint  Patient presents with  . Rash     The history is provided by the patient. No language interpreter was used.    [name] is a 37 y.o. female who presents to the Emergency Department complaining of a rash to her generalized upper body with associated itchiness. She states she believes she got bed bugs from a used box spring and has had a "bad reaction" to the bites. She denies any new medications, soaps and lotions. She denies any SOB, throat tightness and wheezing as associated symptoms.   Past Medical History  Diagnosis Date  . Migraine     Past Surgical History  Procedure Date  . Cesarean section   . Knee surgery   . Cesarean section     No family history on file.  History  Substance Use Topics  . Smoking status: Current Every Day Smoker -- 1.0 packs/day  . Smokeless tobacco: Not on file  . Alcohol Use: No   No OB history available.   Review of Systems  Skin: Positive for rash.  All other systems reviewed and are negative.    Allergies  Fish allergy; Naprosyn; and Sulfa antibiotics  Home Medications  No current outpatient prescriptions on file.  Triage Vitals: BP 115/76  Pulse 69  Temp 98.7 F (37.1 C) (Oral)  Resp 14  SpO2 100%  LMP 12/10/2012  Physical Exam  Nursing note and vitals reviewed. Constitutional: She is oriented to person, place, and time. She appears well-developed and well-nourished. No distress.  HENT:  Head: Normocephalic and atraumatic.  Eyes: EOM are normal. Pupils are equal, round, and reactive to light.  Neck: Normal range of motion. Neck supple. No tracheal deviation present.    Cardiovascular: Normal rate.   Pulmonary/Chest: Effort normal. No respiratory distress.  Abdominal: Soft. She exhibits no distension.  Musculoskeletal: Normal range of motion. She exhibits no edema.  Neurological: She is alert and oriented to person, place, and time.  Skin: Skin is warm and dry. Rash noted.       Papular rash on left neck, left dorsal forearm, left jaw, central chest and left ear  Psychiatric: She has a normal mood and affect. Her behavior is normal.    ED Course  Procedures (including critical care time)  DIAGNOSTIC STUDIES: Oxygen Saturation is 100% on room air, normal by my interpretation.    COORDINATION OF CARE:  9:10 PM: Discussed treatment plan which includes pain medication and an anti-itch cream with pt at bedside and pt agreed to plan.     Labs Reviewed - No data to display No results found.   1. Rash       MDM  Patient will be discharged with prescriptions for cortisone cream and atarax. Patient instructed to return with worsening or concerning symptoms.      I personally performed the services described in this documentation, which was scribed in my presence. The recorded information has been reviewed and is accurate.    [name], [place] 12/17/12 [phone]

## 2012-12-17 NOTE — ED Provider Notes (Signed)
Medical screening examination/treatment/procedure(s) were performed by non-physician practitioner and as supervising physician I was immediately available for consultation/collaboration.   Terrell Ostrand L Alani Lacivita, MD 12/17/12 1241 

## 2012-12-29 ENCOUNTER — Emergency Department (HOSPITAL_COMMUNITY)
Admission: EM | Admit: 2012-12-29 | Discharge: 2012-12-29 | Disposition: A | Discharge status: Home / Self Care | Payer: Self-pay | Attending: Emergency Medicine | Admitting: Emergency Medicine

## 2012-12-29 ENCOUNTER — Encounter (HOSPITAL_COMMUNITY): Payer: Self-pay

## 2012-12-29 DIAGNOSIS — R21 Rash and other nonspecific skin eruption: Secondary | ICD-10-CM | POA: Insufficient documentation

## 2012-12-29 DIAGNOSIS — F17200 Nicotine dependence, unspecified, uncomplicated: Secondary | ICD-10-CM | POA: Insufficient documentation

## 2012-12-29 DIAGNOSIS — Z8679 Personal history of other diseases of the circulatory system: Secondary | ICD-10-CM | POA: Insufficient documentation

## 2012-12-29 MED ORDER — DEXAMETHASONE SODIUM PHOSPHATE 10 MG/ML IJ SOLN
10.0000 mg | Freq: Once | INTRAMUSCULAR | Status: AC
  Administered 2012-12-29: 10 mg via INTRAMUSCULAR
  Filled 2012-12-29: qty 1

## 2012-12-29 MED ORDER — HYDROCORTISONE 2.5 % EX LOTN: TOPICAL_LOTION | Freq: Two times a day (BID) | CUTANEOUS | Status: DC

## 2012-12-29 MED ORDER — HYDROXYZINE PAMOATE 100 MG PO CAPS: 100.0000 mg | ORAL_CAPSULE | Freq: Three times a day (TID) | ORAL | Status: DC | PRN

## 2012-12-29 NOTE — ED Notes (Signed)
Patient presents with c/o itchy rash to left neck, left forearm, left jaw and central chest. Intermittent x 2 months. Was seen here 12/16/12 for the same. Received Rx cortisone cream and atarax. Patient states that rash has "got somewhat better but still burns and itch."

## 2012-12-29 NOTE — ED Provider Notes (Signed)
History  This chart was scribed for non-physician practitioner, Wynetta Emery,, PA-C working with Loren Racer, MD by Shari Heritage, ED Scribe. This patient was seen in room TR08C/TR08C and the patient's care was started at 1934.   CSN: [id number]  Arrival date & time 12/29/12  1934   Chief Complaint  Patient presents with  . Rash    The history is provided by the patient. No language interpreter was used.    HPI Comments: [name] is a 37 y.o. female who presents to the Emergency Department complaining of painful pruritic rash to the left forearm onset 1 month ago. Patient states that pain is burning in quality. She says that rash has been present in upper chest and left face, but those areas are mostly resolved. Patient is not sure of the source of the rash. She was seen here for the same problem on 12/16/12 and was prescribed cortisone cream and Atarax. She says that the rash improved transiently, but has never completely gone away. Patient states that she was given bed rails from a friend that was infested with bed bugs. She thinks that rash originally occurred due to bed bugs. Patient denies any other complaints at this time.   Past Medical History  Diagnosis Date  . Migraine     Past Surgical History  Procedure Laterality Date  . Cesarean section    . Knee surgery    . Cesarean section      No family history on file.  History  Substance Use Topics  . Smoking status: Current Every Day Smoker -- 1.00 packs/day  . Smokeless tobacco: Not on file  . Alcohol Use: No    OB History   Grav Para Term Preterm Abortions TAB SAB Ect Mult Living                  Review of Systems  Constitutional: Negative for fever.  Respiratory: Negative for shortness of breath.   Cardiovascular: Negative for chest pain.  Gastrointestinal: Negative for nausea, vomiting, abdominal pain and diarrhea.  Skin: Positive for rash.  All other systems reviewed and are  negative.    Allergies  Fish allergy; Naprosyn; and Sulfa antibiotics  Home Medications   Current Outpatient Rx  Name  Route  Sig  Dispense  Refill  . hydrOXYzine (ATARAX/VISTARIL) 10 MG tablet   Oral   Take 1 tablet (10 mg total) by mouth every 6 (six) hours as needed for itching.   30 tablet   0   . traMADol (ULTRAM) 50 MG tablet   Oral   Take 1 tablet (50 mg total) by mouth every 6 (six) hours as needed for pain.   15 tablet   0     BP 115/70  Pulse 67  Temp(Src) 98.8 F (37.1 C) (Oral)  Resp 15  SpO2 100%  LMP 12/10/2012  Physical Exam  Nursing note and vitals reviewed. Constitutional: She is oriented to person, place, and time. She appears well-developed and well-nourished. No distress.  HENT:  Head: Normocephalic.  Eyes: Conjunctivae and EOM are normal.  Cardiovascular: Normal rate.   Pulmonary/Chest: Effort normal. No stridor.  Musculoskeletal: Normal range of motion.  Neurological: She is alert and oriented to person, place, and time.  Skin:  Scattered, blanchable, 2-3 mm erythematous and excoriated lesions, there is no warmth, discharge, or induration. The intertriginous regions are spared.  Psychiatric: She has a normal mood and affect.    ED Course  Procedures (including critical care time)  DIAGNOSTIC STUDIES: Oxygen Saturation is 100% on room air, normal by my interpretation.    COORDINATION OF CARE: 9:43 PM- Patient informed of current plan for treatment and evaluation and agrees with plan at this time.      Labs Reviewed - No data to display No results found.   1. Rash       MDM  None of the lesions appear infected. Advised patient to discuss extermination with her landlord. Dermatology referral given.   Filed Vitals:   12/29/12 1944  BP: 115/70  Pulse: 67  Temp: 98.8 F (37.1 C)  TempSrc: Oral  Resp: 15  SpO2: 100%     Pt verbalized understanding and agrees with care plan. Outpatient follow-up and return precautions  given.    Discharge Medication List as of 12/29/2012 10:03 PM    START taking these medications   Details  hydrocortisone 2.5 % lotion Apply topically 2 (two) times daily., Starting 12/29/2012, Until Discontinued, Print    hydrOXYzine (VISTARIL) 100 MG capsule Take 1 capsule (100 mg total) by mouth 3 (three) times daily as needed for itching., Starting 12/29/2012, Until Discontinued, Print        I personally performed the services described in this documentation, which was scribed in my presence. The recorded information has been reviewed and is accurate.   Wynetta Emery, PA-C 12/30/12 1721

## 2012-12-31 NOTE — ED Provider Notes (Signed)
Medical screening examination/treatment/procedure(s) were performed by non-physician practitioner and as supervising physician I was immediately available for consultation/collaboration.  Parrie Rasco, MD 12/31/12 1534 

## 2013-04-05 ENCOUNTER — Encounter (HOSPITAL_COMMUNITY): Payer: Self-pay | Admitting: Emergency Medicine

## 2013-04-05 DIAGNOSIS — F17200 Nicotine dependence, unspecified, uncomplicated: Secondary | ICD-10-CM | POA: Insufficient documentation

## 2013-04-05 DIAGNOSIS — M25579 Pain in unspecified ankle and joints of unspecified foot: Secondary | ICD-10-CM | POA: Insufficient documentation

## 2013-04-05 DIAGNOSIS — R51 Headache: Secondary | ICD-10-CM | POA: Insufficient documentation

## 2013-04-05 MED ORDER — ONDANSETRON 4 MG PO TBDP
8.0000 mg | ORAL_TABLET | Freq: Once | ORAL | Status: AC
  Administered 2013-04-05: 8 mg via ORAL
  Filled 2013-04-05: qty 2

## 2013-04-05 NOTE — ED Notes (Signed)
Pt here with bilateral LE pain x weeks and HA on right side of head starting today

## 2013-04-05 NOTE — ED Notes (Signed)
Pt called for updated vitals, no answer in waiting room.

## 2013-04-06 ENCOUNTER — Emergency Department (HOSPITAL_COMMUNITY)
Admission: EM | Admit: 2013-04-06 | Discharge: 2013-04-06 | Discharge status: Against Medical Advice | Payer: Self-pay | Attending: Emergency Medicine | Admitting: Emergency Medicine

## 2013-04-06 NOTE — ED Notes (Signed)
Pt called for vital recheck with no answer 

## 2013-04-22 ENCOUNTER — Ambulatory Visit: Payer: Self-pay | Admitting: Family Medicine

## 2013-04-22 VITALS — BP 110/66 | HR 60 | Temp 98.3°F | Resp 16 | Ht 64.0 in | Wt 185.4 lb

## 2013-04-22 DIAGNOSIS — Z0289 Encounter for other administrative examinations: Secondary | ICD-10-CM

## 2013-04-22 NOTE — Progress Notes (Signed)
Airline pilot Medical Examination   [name] is a 37 y.o. female who presents today for a commercial driver fitness determination physical exam. The patient reports problems - bilateral lower legs aching - sides of lower legs, above ankles.. The following portions of the patient's history were reviewed and updated as appropriate: allergies, current medications, past family history, past medical history, past social history, past surgical history and problem list. Review of Systems Musculoskeletal:positive for arthralgias and myalgias  in legs  Bilateral knee arthroscopy 2009, smoking 1 ppd x 8 yrs  Objective:    Vision:  Uncorrected Corrected Horizontal Field of Vision  Right Eye NA 20/20 85 degrees  Left Eye  NA 20/20 85 degrees  Both Eyes  NA 20/20    Applicant can recognize and distinguish among traffic control signals and devices showing standard red, green, and amber colors.  Applicant meets visual acuity requirement only when wearing corrective lenses.  Monocular Vision?: No   Hearing:   500 Hz 1000 Hz 2000 Hz 4000 Hz  Right Ear  NA NA NA NA  Left Ear  NA NA NA NA  Passes whisper test at 10 ft in both ears    BP 110/66  Pulse 60  Temp(Src) 98.3 F (36.8 C) (Oral)  Resp 16  Ht 5\' 4"  (1.626 m)  Wt 185 lb 6.4 oz (84.097 kg)  BMI 31.81 kg/m2  SpO2 100%  LMP 04/10/2013  General Appearance:    Alert, cooperative, no distress, appears stated age  Head:    Normocephalic, without obvious abnormality, atraumatic  Eyes:    PERRL, conjunctiva/corneas clear, EOM's intact, fundi    benign, both eyes  Ears:    Normal TM's and external ear canals, both ears  Nose:   Nares normal, septum midline, mucosa normal, no drainage    or sinus tenderness  Throat:   Lips, mucosa, and tongue normal; teeth and gums normal  Neck:   Supple, symmetrical, trachea midline, no adenopathy;    thyroid:  no enlargement/tenderness/nodules; no carotid   bruit or JVD  Back:      Symmetric, no curvature, ROM normal, no CVA tenderness  Lungs:     Clear to auscultation bilaterally, respirations unlabored  Chest Wall:    No tenderness or deformity   Heart:    Regular rate and rhythm, S1 and S2 normal, no murmur, rub   or gallop     Abdomen:     Soft, non-tender, bowel sounds active all four quadrants,    no masses, no organomegaly        Extremities:   Extremities normal, atraumatic, no cyanosis or edema  Pulses:   2+ and symmetric all extremities  Skin:   Skin color, texture, turgor normal, no rashes or lesions  Lymph nodes:   Cervical, supraclavicular, and axillary nodes normal  Neurologic:   CNII-XII intact, normal strength, sensation and reflexes    throughout    Labs: Lab Results  Component Value Date   PROTEINUR NEGATIVE 07/08/2012   BILIRUBINUR NEGATIVE 07/08/2012   GLUCOSEU NEGATIVE 07/08/2012      Assessment:    Healthy female exam.  Meets standards in 71 CFR 391.41;  qualifies for 2 year certificate.    Plan:    Medical examiners certificate completed and printed. Return as needed.   Pt will RTC to eval leg pain after she gets health insurance - no joint abnormality or anything that would prevent her driving - more pain in area over lower tib/fib chronically  and intermittent over yrs - more likely to be benign muscle strain - "shin splint" but further eval could be expensive w/ labs and imaging so defer for now.

## 2013-05-05 ENCOUNTER — Encounter (HOSPITAL_COMMUNITY): Payer: Self-pay

## 2013-05-05 ENCOUNTER — Emergency Department (HOSPITAL_COMMUNITY)
Admission: EM | Admit: 2013-05-05 | Discharge: 2013-05-05 | Disposition: A | Discharge status: Home / Self Care | Payer: Self-pay | Attending: Emergency Medicine | Admitting: Emergency Medicine

## 2013-05-05 DIAGNOSIS — L52 Erythema nodosum: Secondary | ICD-10-CM | POA: Insufficient documentation

## 2013-05-05 DIAGNOSIS — R229 Localized swelling, mass and lump, unspecified: Secondary | ICD-10-CM | POA: Insufficient documentation

## 2013-05-05 DIAGNOSIS — Z8679 Personal history of other diseases of the circulatory system: Secondary | ICD-10-CM | POA: Insufficient documentation

## 2013-05-05 DIAGNOSIS — F17200 Nicotine dependence, unspecified, uncomplicated: Secondary | ICD-10-CM | POA: Insufficient documentation

## 2013-05-05 MED ORDER — IBUPROFEN 800 MG PO TABS: 800.0000 mg | ORAL_TABLET | Freq: Three times a day (TID) | ORAL | Status: DC

## 2013-05-05 MED ORDER — OXYCODONE-ACETAMINOPHEN 5-325 MG PO TABS
2.0000 | ORAL_TABLET | Freq: Once | ORAL | Status: AC
  Administered 2013-05-05: 2 via ORAL
  Filled 2013-05-05: qty 2

## 2013-05-05 MED ORDER — OXYCODONE-ACETAMINOPHEN 5-325 MG PO TABS: 1.0000 | ORAL_TABLET | ORAL | Status: DC | PRN

## 2013-05-05 NOTE — ED Provider Notes (Addendum)
History     CSN: [id number]  Arrival date & time 05/05/13  0006   First MD Initiated Contact with Patient 05/05/13 0051      Chief Complaint  Patient presents with  . Leg Swelling    (Consider location/radiation/quality/duration/timing/severity/associated sxs/prior treatment) Patient is a 37 y.o. female presenting with leg pain. The history is provided by the patient. No language interpreter was used.  Leg Pain Location:  Leg Leg location:  L lower leg and R lower leg Associated symptoms: no fever   Associated symptoms comment:  Multiple areas of pain and point tenderness to lower extremities x years, worse in the last several weeks and the worst today. No swelling, redness. No known injury.   Past Medical History  Diagnosis Date  . Migraine     Past Surgical History  Procedure Laterality Date  . Cesarean section    . Knee surgery    . Cesarean section      No family history on file.  History  Substance Use Topics  . Smoking status: Current Every Day Smoker -- 1.00 packs/day  . Smokeless tobacco: Not on file  . Alcohol Use: No    OB History   Grav Para Term Preterm Abortions TAB SAB Ect Mult Living                  Review of Systems  Constitutional: Negative for fever.  Respiratory: Negative for shortness of breath.   Cardiovascular: Negative for chest pain and leg swelling.  Musculoskeletal:       See HPI.    Allergies  Fish allergy; Naprosyn; and Sulfa antibiotics  Home Medications   Current Outpatient Rx  Name  Route  Sig  Dispense  Refill  . ibuprofen (ADVIL,MOTRIN) 200 MG tablet   Oral   Take 400 mg by mouth every 6 (six) hours as needed for pain.         . pseudoephedrine-acetaminophen (TYLENOL SINUS) 30-500 MG TABS   Oral   Take 1 tablet by mouth every 4 (four) hours as needed (nasal congestion).           BP 139/89  Pulse 75  Temp(Src) 98.2 F (36.8 C) (Oral)  Resp 18  SpO2 100%  LMP 04/30/2013  Physical Exam   Constitutional: She is oriented to person, place, and time. She appears well-developed and well-nourished. No distress.  Neck: Normal range of motion.  Cardiovascular: Intact distal pulses.   Pulmonary/Chest: Effort normal.  Musculoskeletal:  Palpable, tender nodular lesions bilateral lower extremities affecting ankles and anterior lower legs. No swelling, muscular tenderness, loss of strength or discoloration.  Neurological: She is alert and oriented to person, place, and time.  Skin: Skin is warm and dry.  Psychiatric: She has a normal mood and affect.    ED Course  Procedures (including critical care time)  Labs Reviewed  URINALYSIS, ROUTINE W REFLEX MICROSCOPIC   No results found.   No diagnosis found.  1. 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  Patient requests pain medication prior to leaving the ED. Discussed safety issues concerning driving and that she would not be able to drive herself home for several hours. She insists on receiving pain medication prior to leaving and understands that security will hold her keys until later and that she would be discharged to the lobby.        Arnoldo Hooker, PA-C 05/05/13 0120  Arnoldo Hooker, PA-C  05/05/13 0325 

## 2013-05-05 NOTE — ED Notes (Signed)
Pt presents with leg swelling that has been going on for a couple of months. Pt says the swelling is in her legs and ankles. Pt says the swelling has gotten worse over the past couple of weeks. Pt ambulatory in triage.

## 2013-05-05 NOTE — ED Notes (Signed)
Pt has some knots present on both of her lower legs which pt claims are painful to the touch. Pt has swellig on both outside ankles.

## 2013-05-05 NOTE — ED Provider Notes (Signed)
Medical screening examination/treatment/procedure(s) were performed by non-physician practitioner and as supervising physician I was immediately available for consultation/collaboration.   Gavin Pound. Oletta Lamas, MD 05/05/13 [phone]

## 2013-05-05 NOTE — ED Notes (Signed)
Pt and PA have agreed that pt would be given a dose of narcotics but had to turn over her keys to security who will return them to her at 0700 hours.

## 2013-05-05 NOTE — ED Provider Notes (Deleted)
Medical screening examination/treatment/procedure(s) were performed by non-physician practitioner and as supervising physician I was immediately available for consultation/collaboration.   Gavin Pound. Oletta Lamas, MD 05/05/13 0131  Gavin Pound. Oletta Lamas, MD 05/05/13 [phone]

## 2013-06-08 ENCOUNTER — Encounter: Payer: Self-pay | Admitting: Family Medicine

## 2013-07-31 ENCOUNTER — Ambulatory Visit: Payer: No Typology Code available for payment source | Admitting: Psychiatry

## 2013-08-06 ENCOUNTER — Encounter (HOSPITAL_COMMUNITY): Payer: Self-pay | Admitting: Emergency Medicine

## 2013-08-06 ENCOUNTER — Emergency Department (HOSPITAL_COMMUNITY)
Admission: EM | Admit: 2013-08-06 | Discharge: 2013-08-06 | Disposition: A | Discharge status: Home / Self Care | Payer: No Typology Code available for payment source | Attending: Emergency Medicine | Admitting: Emergency Medicine

## 2013-08-06 DIAGNOSIS — F17200 Nicotine dependence, unspecified, uncomplicated: Secondary | ICD-10-CM | POA: Insufficient documentation

## 2013-08-06 DIAGNOSIS — G8929 Other chronic pain: Secondary | ICD-10-CM | POA: Insufficient documentation

## 2013-08-06 DIAGNOSIS — Z8619 Personal history of other infectious and parasitic diseases: Secondary | ICD-10-CM | POA: Insufficient documentation

## 2013-08-06 DIAGNOSIS — Z8679 Personal history of other diseases of the circulatory system: Secondary | ICD-10-CM | POA: Insufficient documentation

## 2013-08-06 DIAGNOSIS — Z791 Long term (current) use of non-steroidal anti-inflammatories (NSAID): Secondary | ICD-10-CM | POA: Insufficient documentation

## 2013-08-06 DIAGNOSIS — Z9889 Other specified postprocedural states: Secondary | ICD-10-CM | POA: Insufficient documentation

## 2013-08-06 DIAGNOSIS — M79609 Pain in unspecified limb: Secondary | ICD-10-CM | POA: Insufficient documentation

## 2013-08-06 DIAGNOSIS — M79604 Pain in right leg: Secondary | ICD-10-CM

## 2013-08-06 HISTORY — DX: Sarcoidosis, unspecified: D86.9

## 2013-08-06 LAB — GLUCOSE, CAPILLARY: Glucose-Capillary: 109 mg/dL — ABNORMAL HIGH (ref 70–99)

## 2013-08-06 MED ORDER — OXYCODONE-ACETAMINOPHEN 5-325 MG PO TABS
2.0000 | ORAL_TABLET | Freq: Once | ORAL | Status: AC
  Administered 2013-08-06: 2 via ORAL
  Filled 2013-08-06: qty 2

## 2013-08-06 MED ORDER — TRAMADOL HCL 50 MG PO TABS: 50.0000 mg | ORAL_TABLET | Freq: Four times a day (QID) | ORAL | Status: DC | PRN

## 2013-08-06 NOTE — ED Provider Notes (Signed)
CSN: [id number]     Arrival date & time 08/06/13  2058 History  This chart was scribed for non-physician practitioner Johnnette Gourd, PA,  working with Donnetta Hutching, MD, by [facility] ED Scribe. This patient was seen in room TR06C/TR06C and the patient's care was started at 9:24PM.    Chief Complaint  Patient presents with  . Leg Pain    The history is provided by the patient. No language interpreter was used.   HPI Comments: [name] is a 37 y.o. female who presents to the Emergency Department complaining of constant bilateral leg and feet pain which onset a year ago. Pain is waxing and waning, worsens after walking. Pt associated symptom is numbness.Pt is able to ambulate well.  Pt states she has not been sleeping well for the past three days due to the pain. Pt reports she was previously prescribed Ibprofen and percocet with relief but Percocet is too strong. PT has a history of sarcoidosis. Pt has no history of diabetes. Pt denies numbness, nausea, tingling, back pain or any other symptoms.    Past Medical History  Diagnosis Date  . Migraine   . Sarcoidosis    Past Surgical History  Procedure Laterality Date  . Cesarean section    . Knee surgery    . Cesarean section     No family history on file. History  Substance Use Topics  . Smoking status: Current Every Day Smoker -- 1.00 packs/day  . Smokeless tobacco: Not on file  . Alcohol Use: No   OB History   Grav Para Term Preterm Abortions TAB SAB Ect Mult Living                 Review of Systems  All other systems reviewed and are negative.    Allergies  Fish allergy; Naprosyn; and Sulfa antibiotics  Home Medications   Current Outpatient Rx  Name  Route  Sig  Dispense  Refill  . ibuprofen (ADVIL,MOTRIN) 200 MG tablet   Oral   Take 400 mg by mouth every 6 (six) hours as needed for pain.         Marland Kitchen ibuprofen (ADVIL,MOTRIN) 800 MG tablet   Oral   Take 1 tablet (800 mg total) by mouth 3 (three) times  daily.   21 tablet   0   . oxyCODONE-acetaminophen (PERCOCET/ROXICET) 5-325 MG per tablet   Oral   Take 1 tablet by mouth every 4 (four) hours as needed for pain.   15 tablet   0   . pseudoephedrine-acetaminophen (TYLENOL SINUS) 30-500 MG TABS   Oral   Take 1 tablet by mouth every 4 (four) hours as needed (nasal congestion).          Triage Vitals: BP 140/72  Pulse 77  Temp(Src) 98 F (36.7 C) (Oral)  Resp 14  SpO2 100%  LMP 07/11/2013 Physical Exam  Nursing note and vitals reviewed. Constitutional: She is oriented to person, place, and time. She appears well-developed and well-nourished. No distress.  HENT:  Head: Normocephalic and atraumatic.  Mouth/Throat: Oropharynx is clear and moist.  Eyes: Conjunctivae and EOM are normal.  Neck: Normal range of motion. Neck supple.  Cardiovascular: Normal rate, regular rhythm and normal heart sounds.   Pulmonary/Chest: Effort normal and breath sounds normal. No respiratory distress.  Musculoskeletal: Normal range of motion. She exhibits no edema.  Bilateral lower ext. No edema. No calf tender or swell Intact. Normal gait.   Neurological: She is alert and oriented  to person, place, and time. No sensory deficit.  Skin: Skin is warm and dry.  Psychiatric: She has a normal mood and affect. Her behavior is normal.    ED Course  Procedures (including critical care time)  DIAGNOSTIC STUDIES: Oxygen Saturation is 100% on RA, normal by my interpretation.    COORDINATION OF CARE: 9:26 PM- Pt advised of plan for treatment of pain medication, and elevate feet to lessen the pain. and pt agrees. 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      Labs Review Labs Reviewed  GLUCOSE, CAPILLARY - Abnormal; Notable for the following:    Glucose-Capillary 109 (*)    All other components within normal limits   Imaging Review No results found.  MDM   1. Leg pain, bilateral   2. Chronic pain    Patient with bilateral leg pain.  No edema, erythema. This has been a problem for 1 year. She does not have a primary care provider. CBG checked, glucose level 109. Initial thought of possible peripheral neuropathy. She is ambulating without difficulty. States Percocet is too strong and she needs something different for pain. Ibuprofen is not helping. I will discharge her with tramadol. Resource guide given for PCP followup. Return precautions discussed. Patient states understanding of plan and is agreeable.  I personally performed the services described in this documentation, which was scribed in my presence. The recorded information has been reviewed and is accurate.     Trevor Mace, PA-C 08/06/13 2245

## 2013-08-06 NOTE — ED Notes (Signed)
Pt. reports persistent pain at legs and feet for 1 year denies injury , ambulatory , states history of sarcoidosis , respirations unlabored .

## 2013-08-07 NOTE — ED Provider Notes (Signed)
Medical screening examination/treatment/procedure(s) were performed by non-physician practitioner and as supervising physician I was immediately available for consultation/collaboration.  Donnetta Hutching, MD 08/07/13 [phone]

## 2013-11-01 ENCOUNTER — Emergency Department (HOSPITAL_COMMUNITY): Payer: Self-pay

## 2013-11-01 ENCOUNTER — Encounter (HOSPITAL_COMMUNITY): Payer: Self-pay | Admitting: Emergency Medicine

## 2013-11-01 ENCOUNTER — Emergency Department (HOSPITAL_COMMUNITY)
Admission: EM | Admit: 2013-11-01 | Discharge: 2013-11-01 | Disposition: A | Discharge status: Home / Self Care | Payer: Self-pay | Attending: Emergency Medicine | Admitting: Emergency Medicine

## 2013-11-01 DIAGNOSIS — J019 Acute sinusitis, unspecified: Secondary | ICD-10-CM | POA: Insufficient documentation

## 2013-11-01 DIAGNOSIS — Z8679 Personal history of other diseases of the circulatory system: Secondary | ICD-10-CM | POA: Insufficient documentation

## 2013-11-01 DIAGNOSIS — Z8619 Personal history of other infectious and parasitic diseases: Secondary | ICD-10-CM | POA: Insufficient documentation

## 2013-11-01 DIAGNOSIS — F17200 Nicotine dependence, unspecified, uncomplicated: Secondary | ICD-10-CM | POA: Insufficient documentation

## 2013-11-01 DIAGNOSIS — J069 Acute upper respiratory infection, unspecified: Secondary | ICD-10-CM | POA: Insufficient documentation

## 2013-11-01 LAB — RAPID STREP SCREEN (MED CTR MEBANE ONLY): Streptococcus, Group A Screen (Direct): NEGATIVE

## 2013-11-01 MED ORDER — AMOXICILLIN-POT CLAVULANATE 875-125 MG PO TABS: 1.0000 | ORAL_TABLET | Freq: Two times a day (BID) | ORAL | Status: DC

## 2013-11-01 NOTE — ED Notes (Signed)
C/o runny nose, productive cough with green phlegm, sore throat, headache, and sweating x 3 days.

## 2013-11-01 NOTE — ED Provider Notes (Signed)
CSN: [id number]     Arrival date & time 11/01/13  2117 History  This chart was scribed for non-physician practitioner, Raymon Mutton, PA-C,working with Flint Melter, MD, by Karle Plumber, ED Scribe.  This patient was seen in room TR05C/TR05C and the patient's care was started at 10:40 PM.  Chief Complaint  Patient presents with  . Cough   The history is provided by the patient. No language interpreter was used.   HPI Comments:  [name] is a 37 y.o. female who presents to the Emergency Department complaining of a productive cough of yellow and green phlegm, sore throat, nasal congestion, clear rhinorrhea, sinus pressure, headaches for approximately one week. She reports associated chills. She states she has taken OTC cold and sinus medication with no relief. She states she has been using steam to help break up her congestion. She denies ear pain, CP, SOB, neck pain, neck stiffness, numbness or tingling, difficulty breathing, nausea, vomiting, diarrhea. She denies sick contacts. She states she has not had the flu vaccination.    Past Medical History  Diagnosis Date  . Migraine   . Sarcoidosis    Past Surgical History  Procedure Laterality Date  . Cesarean section    . Knee surgery    . Cesarean section     No family history on file. History  Substance Use Topics  . Smoking status: Current Every Day Smoker -- 1.00 packs/day  . Smokeless tobacco: Not on file  . Alcohol Use: Yes   OB History   Grav Para Term Preterm Abortions TAB SAB Ect Mult Living                 Review of Systems  Constitutional: Positive for chills. Negative for fever.  HENT: Positive for congestion, rhinorrhea, sinus pressure and sore throat. Negative for ear pain. Voice change: raspy, laryngitis.   Respiratory: Positive for cough (productive with green phlegm). Negative for shortness of breath.   Cardiovascular: Negative for chest pain.  Gastrointestinal: Negative for nausea and vomiting.   Musculoskeletal: Negative for neck pain and neck stiffness.  Neurological: Positive for headaches. Negative for speech difficulty and numbness.  All other systems reviewed and are negative.    Allergies  Fish allergy; Naprosyn; and Sulfa antibiotics  Home Medications   Current Outpatient Rx  Name  Route  Sig  Dispense  Refill  . ibuprofen (ADVIL,MOTRIN) 800 MG tablet   Oral   Take 800 mg by mouth every 4 (four) hours as needed for pain.         . traMADol (ULTRAM) 50 MG tablet   Oral   Take 1 tablet (50 mg total) by mouth every 6 (six) hours as needed for pain.   15 tablet   0   . amoxicillin-clavulanate (AUGMENTIN) 875-125 MG per tablet   Oral   Take 1 tablet by mouth 2 (two) times daily. One po bid x 7 days   14 tablet   0    Triage Vitals: BP 132/83  Pulse 84  Temp(Src) 98.6 F (37 C) (Oral)  Ht 5\' 4"  (1.626 m)  Wt 180 lb (81.647 kg)  BMI 30.88 kg/m2  SpO2 98%  LMP 10/25/2013 Physical Exam  Nursing note and vitals reviewed. Constitutional: She is oriented to person, place, and time. She appears well-developed and well-nourished. No distress.  Patient nondiaphoretic  HENT:  Head: Normocephalic and atraumatic.  Right Ear: External ear normal.  Left Ear: External ear normal.  Mouth/Throat: Oropharynx is clear  and moist. No oropharyngeal exudate.  Mild erythema localized to the posterior oropharynx. Negative swelling, exudate, petechiae noted to the posterior oropharynx. Negative swelling localized to the tonsils bilaterally. Uvula midline, symmetrical elevation. Negative trismus. Negative post-nasal drip.  Negative facial swelling identified. Discomfort upon palpation to frontal and bilateral maxillary sinuses.  Eyes: Conjunctivae and EOM are normal. Pupils are equal, round, and reactive to light. Right eye exhibits no discharge. Left eye exhibits no discharge.  Neck: Normal range of motion. Neck supple.  Negative neck stiffness Negative nuchal  rigidity Negative pain upon palpation to cervical spine Negative meningeal signs  Cardiovascular: Normal rate, regular rhythm and normal heart sounds.  Exam reveals no friction rub.   No murmur heard. Pulses:      Radial pulses are 2+ on the right side, and 2+ on the left side.  Pulmonary/Chest: Effort normal and breath sounds normal. No respiratory distress. She has no wheezes. She has no rales. She exhibits no tenderness.  Negative pain upon palpation to the chest wall Patient is able to speak in full sentences without difficulty Negative use of accessory muscles Negative signs of respiratory distress  Musculoskeletal: Normal range of motion.  Full range of motion to upper and lower extremities bilaterally without difficulty noted  Lymphadenopathy:    She has no cervical adenopathy.  Neurological: She is alert and oriented to person, place, and time. She exhibits normal muscle tone. Coordination normal.  Skin: Skin is warm and dry. No rash noted. She is not diaphoretic. No erythema.  Psychiatric: She has a normal mood and affect. Her behavior is normal. Thought content normal.    ED Course  Procedures (including critical care time) DIAGNOSTIC STUDIES: Oxygen Saturation is 98% on RA, normal by my interpretation.   COORDINATION OF CARE: 10:47 PM- Will do a rapid strep test. Pt verbalizes understanding and agrees to plan.  Results for orders placed during the hospital encounter of 11/01/13  RAPID STREP SCREEN      Result Value Range   Streptococcus, Group A Screen (Direct) NEGATIVE  NEGATIVE    Dg Chest 2 View  11/01/2013   CLINICAL DATA:  Productive cough with chills and body aches. History of sarcoidosis.  EXAM: CHEST  2 VIEW  COMPARISON:  10/20/2009.  FINDINGS: The heart size and mediastinal contours are within normal limits. Both lungs are clear. The visualized skeletal structures are unremarkable.  IMPRESSION: No active cardiopulmonary disease.   Electronically Signed   By:  Davonna Belling M.D.   On: 11/01/2013 23:08    Medications - No data to display  Labs Review Labs Reviewed  RAPID STREP SCREEN  CULTURE, GROUP A STREP   Imaging Review Dg Chest 2 View  11/01/2013   CLINICAL DATA:  Productive cough with chills and body aches. History of sarcoidosis.  EXAM: CHEST  2 VIEW  COMPARISON:  10/20/2009.  FINDINGS: The heart size and mediastinal contours are within normal limits. Both lungs are clear. The visualized skeletal structures are unremarkable.  IMPRESSION: No active cardiopulmonary disease.   Electronically Signed   By: Davonna Belling M.D.   On: 11/01/2013 23:08    EKG Interpretation   None       MDM   1. Acute sinusitis   2. URI, acute     Filed Vitals:   11/01/13 2142  BP: 132/83  Pulse: 84  Temp: 98.6 F (37 C)  TempSrc: Oral  Height: 5\' 4"  (1.626 m)  Weight: 180 lb (81.647 kg)  SpO2: 98%  I personally performed the services described in this documentation, which was scribed in my presence. The recorded information has been reviewed and is accurate.  Patient presenting to emergency department with cough, nasal congestion, sinus pressure that has been ongoing for the past week. Alert and oriented. GCS 15. Heart rate and rhythm normal. Lungs clear to auscultation to upper and lower lobes bilaterally. Patient is able to speak in full sentences without difficulty noted. Pulses palpable and strong, radial 2+ bilaterally. Full range of motion to upper and lower extremities bilaterally. Negative neck stiffness, negative nuchal rigidity. Negative cervical lymphadenopathy. Negative meningeal signs. Mild erythema localized to the posterior oropharynx. Negative postnasal drip identified. Negative swelling to bilateral tonsils. Negative exudate, petechiae noted. Rapid strep test negative. Chest x-ray negative for acute cardiopulmonary disease-negative findings for bronchitis or pneumonia identified. Patient stable, afebrile. Patient nonseptic  appearing. Doubt pneumonia. Doubt PE. Suspicion to be sinus infection, upper respiratory infection. Discharge patient with antibiotics. Referred patient to primary care provider. Discussed with patient to rest, stay hydrated. Discussed with patient to continue monitor symptoms and if symptoms are to worsen or change report back to emergency department-strict return structures given. Patient agreed to plan of care, understood, all questions answered.   Raymon Mutton, PA-C 11/02/13 0236

## 2013-11-03 LAB — CULTURE, GROUP A STREP

## 2013-11-03 NOTE — ED Provider Notes (Signed)
Medical screening examination/treatment/procedure(s) were performed by non-physician practitioner and as supervising physician I was immediately available for consultation/collaboration.  Yariah Selvey L Lorita Forinash, MD 11/03/13 0112 

## 2014-07-10 ENCOUNTER — Encounter (HOSPITAL_COMMUNITY): Payer: Self-pay | Admitting: Emergency Medicine

## 2014-07-10 ENCOUNTER — Emergency Department (HOSPITAL_COMMUNITY)
Admission: EM | Admit: 2014-07-10 | Discharge: 2014-07-10 | Disposition: A | Discharge status: Home / Self Care | Payer: BC Managed Care – PPO | Attending: Emergency Medicine | Admitting: Emergency Medicine

## 2014-07-10 DIAGNOSIS — M7120 Synovial cyst of popliteal space [Baker], unspecified knee: Secondary | ICD-10-CM | POA: Diagnosis not present

## 2014-07-10 DIAGNOSIS — Z8679 Personal history of other diseases of the circulatory system: Secondary | ICD-10-CM | POA: Insufficient documentation

## 2014-07-10 DIAGNOSIS — M7122 Synovial cyst of popliteal space [Baker], left knee: Secondary | ICD-10-CM

## 2014-07-10 DIAGNOSIS — G8929 Other chronic pain: Secondary | ICD-10-CM

## 2014-07-10 DIAGNOSIS — M25561 Pain in right knee: Secondary | ICD-10-CM

## 2014-07-10 DIAGNOSIS — M25562 Pain in left knee: Secondary | ICD-10-CM

## 2014-07-10 DIAGNOSIS — Z8619 Personal history of other infectious and parasitic diseases: Secondary | ICD-10-CM | POA: Diagnosis not present

## 2014-07-10 DIAGNOSIS — F17200 Nicotine dependence, unspecified, uncomplicated: Secondary | ICD-10-CM | POA: Diagnosis not present

## 2014-07-10 DIAGNOSIS — M25569 Pain in unspecified knee: Secondary | ICD-10-CM | POA: Insufficient documentation

## 2014-07-10 MED ORDER — IBUPROFEN 600 MG PO TABS: 600.0000 mg | ORAL_TABLET | Freq: Three times a day (TID) | ORAL | Status: DC | PRN

## 2014-07-10 MED ORDER — HYDROCODONE-ACETAMINOPHEN 5-325 MG PO TABS: 1.0000 | ORAL_TABLET | Freq: Four times a day (QID) | ORAL | Status: DC | PRN

## 2014-07-10 NOTE — ED Provider Notes (Signed)
Medical screening examination/treatment/procedure(s) were performed by non-physician practitioner and as supervising physician I was immediately available for consultation/collaboration.   Lorianna Spadaccini T Joie Reamer, MD 07/10/14 2258 

## 2014-07-10 NOTE — ED Notes (Signed)
Upon going to d/c pt, pt not found in room. Awaiting pt's return.

## 2014-07-10 NOTE — ED Notes (Signed)
Pt presents with c/o bilateral knee pain. Pt says that she has a hx of knee pain for several years but it has gotten to the point where it is interfering with her work. Pt reports feeling a "golf ball" on the back of both of her knees. Ambulatory to triage.

## 2014-07-10 NOTE — Discharge Instructions (Signed)
The swelling behind your knee is probably a baker's cyst or lymph node which will need to be evaluated by ultrasound. Find a primary care doctor and they will be able to obtain the ultrasound and discuss further management. Use heat, rest, and elevate your knee for relief of pain. Use ibuprofen or norco as needed for pain relief. Return to the ER for any changes or worsening symptoms.   Baker Cyst A Baker cyst is a sac-like structure that forms in the back of the knee. It is filled with the same fluid that is located in your knee. This fluid lubricates the bones and cartilage of the knee and allows them to move over each other more easily. CAUSES  When the knee becomes injured or inflamed, increased fluid forms in the knee. When this happens, the joint lining is pushed out behind the knee and forms the Baker cyst. This cyst may also be caused by inflammation from arthritic conditions and infections. SIGNS AND SYMPTOMS  A Baker cyst usually has no symptoms. When the cyst is substantially enlarged:  You may feel pressure behind the knee, stiffness in the knee, or a mass in the area behind the knee.  You may develop pain, redness, and swelling in the calf. This can suggest a blood clot and requires evaluation by your health care provider. DIAGNOSIS  A Baker cyst is most often found during an ultrasound exam. This exam may have been performed for other reasons, and the cyst was found incidentally. Sometimes an MRI is used. This picks up other problems within a joint that an ultrasound exam may not. If the Baker cyst developed immediately after an injury, X-ray exams may be used to diagnose the cyst. TREATMENT  The treatment depends on the cause of the cyst. Anti-inflammatory medicines and rest often will be prescribed. If the cyst is caused by a bacterial infection, antibiotic medicines may be prescribed.  HOME CARE INSTRUCTIONS   If the cyst was caused by an injury, for the first 24 hours, keep the  injured leg elevated on 2 pillows while lying down.  For the first 24 hours while you are awake, apply ice to the injured area:  Put ice in a plastic bag.  Place a towel between your skin and the bag.  Leave the ice on for 20 minutes, 2-3 times a day.  Only take over-the-counter or prescription medicines for pain, discomfort, or fever as directed by your health care provider.  Only take antibiotic medicine as directed. Make sure to finish it even if you start to feel better. MAKE SURE YOU:   Understand these instructions.  Will watch your condition.  Will get help right away if you are not doing well or get worse. Document Released: 11/07/2005 Document Revised: 08/28/2013 Document Reviewed: 06/19/2013 [facility] Patient Information 2015 [place], [place]. This information is not intended to replace advice given to you by your health care provider. Make sure you discuss any questions you have with your health care provider.  Knee Pain Knee pain can be a result of an injury or other medical conditions. Treatment will depend on the cause of your pain. HOME CARE  Only take medicine as told by your doctor.  Keep a healthy weight. Being overweight can make the knee hurt more.  Stretch before exercising or playing sports.  If there is constant knee pain, change the way you exercise. Ask your doctor for advice.  Make sure shoes fit well. Choose the right shoe for the sport or  activity.  Protect your knees. Wear kneepads if needed.  Rest when you are tired. GET HELP RIGHT AWAY IF:   Your knee pain does not stop.  Your knee pain does not get better.  Your knee joint feels hot to the touch.  You have a fever. MAKE SURE YOU:   Understand these instructions.  Will watch this condition.  Will get help right away if you are not doing well or get worse. Document Released: 02/03/2009 Document Revised: 01/30/2012 Document Reviewed: 02/03/2009 [facility] Patient Information 2015  [place]. This information is not intended to replace advice given to you by your health care provider. Make sure you discuss any questions you have with your health care provider.

## 2014-07-10 NOTE — ED Notes (Signed)
Initial Contact - pt A+OX4, ambulatory without issue, presents with c/o bilat knee pain for years.  Pt reports having difficulty with work which is why she presents to ER tonight.  MAEI, +csm/+pulses.  Skin PWD.  Speaking full/clear sentences.  NAD.

## 2014-07-10 NOTE — ED Provider Notes (Signed)
CSN: [id number]     Arrival date & time 07/10/14  1916 History   First MD Initiated Contact with Patient 07/10/14 1938     Chief Complaint  Patient presents with  . Knee Pain     (Consider location/radiation/quality/duration/timing/severity/associated sxs/prior Treatment) HPI Comments: [name] is a 38 y.o. female with a PMHx of sarcoidosis and migraines, who presents to the ED with c/o bilateral posterior knee pain ongoing for months (<1 yr). States the pain is achy, 9/10, constant, radiating down the lower legs, increases with standing gradually over the course of the day, improved with rest and elevation. States her legs swell gradually throughout the day and improve with rest. Has tried NSAIDs with some relief. Reports that she had previously seen Dr. Myra Rude for a R meniscal tear and arthroscopic surgery in 09/2013, which she states continues to pop when she walks but is unrelated to this posterior knee pain. States the pain limits her ability to walk over the course of the day because the legs feel achy and painful. Denies estrogen use, recent immobility or surgery, hx of DVT/PE. Denies CP, SOB, cough, hemoptysis, abd pain, N/V/D, paresthesias, cramping myalgias, or weakness. Denies LE swelling unilaterally, or any skin changes. Denies warmth to the area.   Patient is a 38 y.o. female presenting with leg pain. The history is provided by the patient. No language interpreter was used.  Leg Pain Location:  Knee Injury: no   Knee location:  L knee and R knee Pain details:    Quality:  Aching   Radiates to:  R leg and L leg   Severity:  Moderate (9/10 by the end of the day after standing)   Onset quality:  Gradual   Duration: <1 yr, but "many months"   Timing:  Constant   Progression:  Unchanged Chronicity:  Recurrent Dislocation: no   Prior injury to area:  No Relieved by:  Elevation, rest and NSAIDs Worsened by:  Activity Ineffective treatments:  None tried Associated  symptoms: swelling (B/L lower legs swell after walking all day, relieved by rest and elevation)   Associated symptoms: no back pain, no decreased ROM, no fever, no muscle weakness, no neck pain, no numbness, no stiffness and no tingling     Past Medical History  Diagnosis Date  . Migraine   . Sarcoidosis    Past Surgical History  Procedure Laterality Date  . Cesarean section    . Knee surgery    . Cesarean section     No family history on file. History  Substance Use Topics  . Smoking status: Current Every Day Smoker -- 1.00 packs/day  . Smokeless tobacco: Not on file  . Alcohol Use: Yes     Comment: socially    OB History   Grav Para Term Preterm Abortions TAB SAB Ect Mult Living                 Review of Systems  Constitutional: Negative for fever and chills.  Respiratory: Negative for cough and shortness of breath.   Cardiovascular: Positive for leg swelling (intermittent). Negative for chest pain.  Gastrointestinal: Negative for nausea, vomiting and abdominal pain.  Musculoskeletal: Positive for arthralgias. Negative for back pain, joint swelling, myalgias, neck pain and stiffness.  Skin: Negative for color change.  Neurological: Negative for weakness, numbness and headaches.  Hematological: Negative for adenopathy. Does not bruise/bleed easily.  Psychiatric/Behavioral: Negative for confusion.  10 Systems reviewed and are negative for acute change except as  noted in the HPI.     Allergies  Fish allergy; Naprosyn; and Sulfa antibiotics  Home Medications   Prior to Admission medications   Medication Sig Start Date End Date Taking? Authorizing Provider  ibuprofen (ADVIL,MOTRIN) 200 MG tablet Take 800 mg by mouth every 6 (six) hours as needed for moderate pain.   Yes Historical Provider, MD  HYDROcodone-acetaminophen (NORCO) 5-325 MG per tablet Take 1-2 tablets by mouth every 6 (six) hours as needed for severe pain. 07/10/14   Carle Fenech Strupp Camprubi-Soms, PA-C   ibuprofen (ADVIL,MOTRIN) 600 MG tablet Take 1 tablet (600 mg total) by mouth every 8 (eight) hours as needed for fever, headache, moderate pain or cramping. 07/10/14   Donnita FallsMercedes Strupp Camprubi-Soms, PA-C   BP 124/77  Pulse 60  Temp(Src) 98.4 F (36.9 C) (Oral)  Resp 20  SpO2 100%  LMP 06/09/2014 Physical Exam  Nursing note and vitals reviewed. Constitutional: She is oriented to person, place, and time. Vital signs are normal. She appears well-developed and well-nourished. No distress.  HENT:  Head: Normocephalic and atraumatic.  Mouth/Throat: Mucous membranes are normal.  Eyes: Conjunctivae and EOM are normal. Right eye exhibits no discharge. Left eye exhibits no discharge.  Neck: Normal range of motion. Neck supple.  Cardiovascular: Normal rate and intact distal pulses.   Distal pulses equal in all extremities, cap refill brisk and present in all digits  Pulmonary/Chest: Effort normal and breath sounds normal. No respiratory distress. She has no decreased breath sounds. She has no wheezes. She has no rhonchi. She has no rales.  Abdominal: Normal appearance. She exhibits no distension.  Musculoskeletal: Normal range of motion.       Right knee: She exhibits abnormal meniscus. She exhibits normal range of motion, no swelling, no effusion, no deformity, no erythema, normal alignment, no LCL laxity, normal patellar mobility, no bony tenderness and no MCL laxity. No tenderness found.       Left knee: She exhibits normal range of motion, no swelling, no effusion, no deformity, no erythema, normal alignment, no LCL laxity, normal patellar mobility, no bony tenderness and no MCL laxity. No tenderness found.  R knee with FROM intact, no swelling, erythema, or effusion, no deformity. No joint line TTP, bony TTP, laxity with varus/valgus pressure. No abnormal patellar mobility or alignment. +crepitus/meniscal movement with flexion, +mcmurray's test. Neg anterior drawer. 3cm area of swelling  posteriorly, mildly TTP, medial to popliteal fossa, nonmobile, nonerythematous, nonindurated or fluctuant.  L knee with FROM intact, no swelling, erythema, or effusion, no deformity. No joint line TTP, bony TTP, laxity with varus/valgus pressure. No abnormal patellar mobility or alignment. No crepitus, neg mcmurray's test. Neg anterior drawer. Small 0.5cm baker's cyst palpated in posterior popliteal fossa, nontender to palpation.  Gait nonantalgic Neg homan's sign bilaterally No pedal or pretibial edema  Neurological: She is alert and oriented to person, place, and time. She has normal strength. No sensory deficit.  Strength 5/5 in all extremities, sensation grossly intact in all extremities  Skin: Skin is warm, dry and intact. No rash noted. No erythema.  No erythema or warmth to BLEs  Psychiatric: She has a normal mood and affect.    ED Course  Procedures (including critical care time) Labs Review Labs Reviewed - No data to display  Imaging Review No results found.   EKG Interpretation None      MDM   Final diagnoses:  Baker's cyst of knee, left  Bilateral chronic knee pain    37y/o female with b/l  posterior knee pain, L knee likely with baker's cyst, R knee with possible lymph node swelling given pt hx of sarcoidosis. Doubt this is a DVT, without LE swelling or skin changes, stable area for approx 1 yr, and no CP/SOB therefore doubt PE. Will have pt establish care with PCP, or see orthopedist, for u/s and possible bx depending on results of U/S. Discussed use of heat, rest, elevation, and NSAIDs. Rx for ibuprofen and norco for pain. I explained the diagnosis and have given explicit precautions to return to the ER including for any other new or worsening symptoms. The patient understands and accepts the medical plan as it's been dictated and I have answered their questions. Discharge instructions concerning home care and prescriptions have been given. The patient is STABLE and is  discharged to home in good condition.  BP 124/77  Pulse 60  Temp(Src) 98.4 F (36.9 C) (Oral)  Resp 20  SpO2 100%  LMP 06/09/2014  Meds ordered this encounter  Medications  . ibuprofen (ADVIL,MOTRIN) 200 MG tablet    Sig: Take 800 mg by mouth every 6 (six) hours as needed for moderate pain.  Marland Kitchen HYDROcodone-acetaminophen (NORCO) 5-325 MG per tablet    Sig: Take 1-2 tablets by mouth every 6 (six) hours as needed for severe pain.    Dispense:  6 tablet    Refill:  0    Order Specific Question:  Supervising Provider    Answer:  Eber Hong D [[id number]]  . ibuprofen (ADVIL,MOTRIN) 600 MG tablet    Sig: Take 1 tablet (600 mg total) by mouth every 8 (eight) hours as needed for fever, headache, moderate pain or cramping.    Dispense:  30 tablet    Refill:  0    Order Specific Question:  Supervising Provider    Answer:  Vida Roller [address] Camprubi-Soms, PA-C 07/10/14 [phone]

## 2014-07-11 ENCOUNTER — Telehealth (HOSPITAL_COMMUNITY): Payer: Self-pay | Admitting: *Deleted

## 2014-07-11 ENCOUNTER — Other Ambulatory Visit: Payer: Self-pay | Admitting: *Deleted

## 2014-07-11 ENCOUNTER — Ambulatory Visit (HOSPITAL_COMMUNITY)
Admission: RE | Admit: 2014-07-11 | Discharge: 2014-07-11 | Disposition: A | Discharge status: Home / Self Care | Payer: BC Managed Care – PPO | Source: Ambulatory Visit | Attending: Cardiovascular Disease | Admitting: Cardiovascular Disease

## 2014-07-11 DIAGNOSIS — M7989 Other specified soft tissue disorders: Secondary | ICD-10-CM | POA: Diagnosis present

## 2014-07-11 DIAGNOSIS — R52 Pain, unspecified: Secondary | ICD-10-CM

## 2014-07-11 DIAGNOSIS — M79609 Pain in unspecified limb: Secondary | ICD-10-CM | POA: Insufficient documentation

## 2014-07-11 MED ORDER — HYDROCODONE-ACETAMINOPHEN 5-325 MG PO TABS: 1.0000 | ORAL_TABLET | Freq: Four times a day (QID) | ORAL | Status: DC | PRN

## 2014-07-11 MED ORDER — IBUPROFEN 600 MG PO TABS: 600.0000 mg | ORAL_TABLET | Freq: Three times a day (TID) | ORAL | Status: DC | PRN

## 2014-07-11 NOTE — ED Provider Notes (Signed)
Medical screening examination/treatment/procedure(s) were performed by non-physician practitioner and as supervising physician I was immediately available for consultation/collaboration.   EKG Interpretation None        Janasha Barkalow H Atom Solivan, MD 07/11/14 2348 

## 2014-07-11 NOTE — ED Provider Notes (Signed)
Pt was seen yesterday for knee pain.  She left before receiving her discharge paper and prescription for ibuprofen and norco and returned today requesting her discharge paper along with prescription.  I have refilled her recently prescribed medication.      Fayrene HelperBowie Aquan Kope, PA-C 07/11/14 1906

## 2014-07-11 NOTE — Progress Notes (Signed)
Right Lower Ext. Venous Duplex Completed. Preliminary results by tech - Negative for DVT and SVT. There is a large complex cystic structure in the popliteal fossa area measuring approximately 6.9 x 1.9 cm. Marilynne Halstedita Geneviene Tesch, BS, RDMS, RVT

## 2014-07-18 ENCOUNTER — Telehealth (HOSPITAL_COMMUNITY): Payer: Self-pay | Admitting: *Deleted

## 2014-09-28 ENCOUNTER — Emergency Department (HOSPITAL_COMMUNITY): Payer: Medicaid Other

## 2014-09-28 ENCOUNTER — Encounter (HOSPITAL_COMMUNITY): Payer: Self-pay | Admitting: *Deleted

## 2014-09-28 ENCOUNTER — Inpatient Hospital Stay (HOSPITAL_COMMUNITY)
Admission: EM | Admit: 2014-09-28 | Discharge: 2014-09-30 | DRG: 195 | Disposition: A | Discharge status: Home / Self Care | Payer: Medicaid Other | Attending: Internal Medicine | Admitting: Internal Medicine

## 2014-09-28 DIAGNOSIS — F17210 Nicotine dependence, cigarettes, uncomplicated: Secondary | ICD-10-CM | POA: Diagnosis present

## 2014-09-28 DIAGNOSIS — G43909 Migraine, unspecified, not intractable, without status migrainosus: Secondary | ICD-10-CM | POA: Diagnosis present

## 2014-09-28 DIAGNOSIS — M791 Myalgia: Secondary | ICD-10-CM | POA: Diagnosis present

## 2014-09-28 DIAGNOSIS — Z91013 Allergy to seafood: Secondary | ICD-10-CM | POA: Diagnosis not present

## 2014-09-28 DIAGNOSIS — D509 Iron deficiency anemia, unspecified: Secondary | ICD-10-CM | POA: Diagnosis present

## 2014-09-28 DIAGNOSIS — D869 Sarcoidosis, unspecified: Secondary | ICD-10-CM | POA: Diagnosis present

## 2014-09-28 DIAGNOSIS — N920 Excessive and frequent menstruation with regular cycle: Secondary | ICD-10-CM | POA: Diagnosis present

## 2014-09-28 DIAGNOSIS — R7989 Other specified abnormal findings of blood chemistry: Secondary | ICD-10-CM

## 2014-09-28 DIAGNOSIS — D6489 Other specified anemias: Secondary | ICD-10-CM

## 2014-09-28 DIAGNOSIS — Z882 Allergy status to sulfonamides status: Secondary | ICD-10-CM

## 2014-09-28 DIAGNOSIS — J189 Pneumonia, unspecified organism: Secondary | ICD-10-CM | POA: Diagnosis present

## 2014-09-28 DIAGNOSIS — R509 Fever, unspecified: Secondary | ICD-10-CM

## 2014-09-28 DIAGNOSIS — D696 Thrombocytopenia, unspecified: Secondary | ICD-10-CM | POA: Diagnosis present

## 2014-09-28 DIAGNOSIS — D649 Anemia, unspecified: Secondary | ICD-10-CM | POA: Diagnosis present

## 2014-09-28 LAB — CBC WITH DIFFERENTIAL/PLATELET
Basophils Absolute: 0 10*3/uL (ref 0.0–0.1)
Basophils Relative: 0 % (ref 0–1)
Eosinophils Absolute: 0 10*3/uL (ref 0.0–0.7)
Eosinophils Relative: 0 % (ref 0–5)
HCT: 24.7 % — ABNORMAL LOW (ref 36.0–46.0)
Hemoglobin: 8.2 g/dL — ABNORMAL LOW (ref 12.0–15.0)
Lymphocytes Relative: 8 % — ABNORMAL LOW (ref 12–46)
Lymphs Abs: 0.5 10*3/uL — ABNORMAL LOW (ref 0.7–4.0)
MCH: 28.9 pg (ref 26.0–34.0)
MCHC: 33.2 g/dL (ref 30.0–36.0)
MCV: 87 fL (ref 78.0–100.0)
Monocytes Absolute: 0.4 10*3/uL (ref 0.1–1.0)
Monocytes Relative: 7 % (ref 3–12)
Neutro Abs: 5.1 10*3/uL (ref 1.7–7.7)
Neutrophils Relative %: 85 % — ABNORMAL HIGH (ref 43–77)
Platelets: 78 10*3/uL — ABNORMAL LOW (ref 150–400)
RBC: 2.84 MIL/uL — ABNORMAL LOW (ref 3.87–5.11)
RDW: 13.2 % (ref 11.5–15.5)
WBC: 6 10*3/uL (ref 4.0–10.5)

## 2014-09-28 LAB — BASIC METABOLIC PANEL WITH GFR
Anion gap: 15 (ref 5–15)
BUN: 7 mg/dL (ref 6–23)
CO2: 24 meq/L (ref 19–32)
Calcium: 8.8 mg/dL (ref 8.4–10.5)
Chloride: 96 meq/L (ref 96–112)
Creatinine, Ser: 0.8 mg/dL (ref 0.50–1.10)
GFR calc Af Amer: >90 mL/min
GFR calc non Af Amer: >90 mL/min
Glucose, Bld: 100 mg/dL — ABNORMAL HIGH (ref 70–99)
Potassium: 4.1 meq/L (ref 3.7–5.3)
Sodium: 135 meq/L — ABNORMAL LOW (ref 137–147)

## 2014-09-28 LAB — URINALYSIS, ROUTINE W REFLEX MICROSCOPIC
Bilirubin Urine: NEGATIVE
Glucose, UA: NEGATIVE mg/dL
Glucose, UA: NEGATIVE mg/dL
Hgb urine dipstick: NEGATIVE
Ketones, ur: >80 mg/dL — AB
Ketones, ur: 40 mg/dL — AB
LEUKOCYTES UA: NEGATIVE
NITRITE: NEGATIVE
Nitrite: NEGATIVE
PH: 6 (ref 5.0–8.0)
Protein, ur: 30 mg/dL — AB
Protein, ur: NEGATIVE mg/dL
SPECIFIC GRAVITY, URINE: 1.013 (ref 1.005–1.030)
Specific Gravity, Urine: 1.026 (ref 1.005–1.030)
UROBILINOGEN UA: 1 mg/dL (ref 0.0–1.0)
Urobilinogen, UA: 1 mg/dL (ref 0.0–1.0)
pH: 6 (ref 5.0–8.0)

## 2014-09-28 LAB — DIC (DISSEMINATED INTRAVASCULAR COAGULATION) PANEL
PROTHROMBIN TIME: 15.6 s — AB (ref 11.6–15.2)
SMEAR REVIEW: NONE SEEN
aPTT: 29 seconds (ref 24–37)

## 2014-09-28 LAB — SAVE SMEAR

## 2014-09-28 LAB — URINE MICROSCOPIC-ADD ON

## 2014-09-28 LAB — HEPATIC FUNCTION PANEL
ALT: 10 U/L (ref 0–35)
AST: 28 U/L (ref 0–37)
Albumin: 3.3 g/dL — ABNORMAL LOW (ref 3.5–5.2)
Alkaline Phosphatase: 67 U/L (ref 39–117)
Bilirubin, Direct: <0.2 mg/dL (ref 0.0–0.3)
Total Bilirubin: 0.9 mg/dL (ref 0.3–1.2)
Total Protein: 7.9 g/dL (ref 6.0–8.3)

## 2014-09-28 LAB — LIPASE, BLOOD: Lipase: 24 U/L (ref 11–59)

## 2014-09-28 LAB — DIC (DISSEMINATED INTRAVASCULAR COAGULATION)PANEL
D-Dimer, Quant: 0.74 ug/mL-FEU — ABNORMAL HIGH (ref 0.00–0.48)
Fibrinogen: 543 mg/dL — ABNORMAL HIGH (ref 204–475)
INR: 1.23 (ref 0.00–1.49)
Platelets: 246 10*3/uL (ref 150–400)

## 2014-09-28 LAB — RETICULOCYTES
RBC.: 3.56 MIL/uL — AB (ref 3.87–5.11)
RETIC CT PCT: 0.8 % (ref 0.4–3.1)
Retic Count, Absolute: 28.5 10*3/uL (ref 19.0–186.0)

## 2014-09-28 LAB — STREP PNEUMONIAE URINARY ANTIGEN: STREP PNEUMO URINARY ANTIGEN: NEGATIVE

## 2014-09-28 LAB — CBG MONITORING, ED: Glucose-Capillary: 95 mg/dL (ref 70–99)

## 2014-09-28 LAB — POC URINE PREG, ED: PREG TEST UR: NEGATIVE

## 2014-09-28 LAB — LACTATE DEHYDROGENASE: LDH: 177 U/L (ref 94–250)

## 2014-09-28 LAB — POC OCCULT BLOOD, ED: Fecal Occult Bld: NEGATIVE

## 2014-09-28 MED ORDER — HYDROMORPHONE HCL 1 MG/ML IJ SOLN: 1.0000 mg | INTRAMUSCULAR | Status: AC | PRN

## 2014-09-28 MED ORDER — SODIUM CHLORIDE 0.9 % IV SOLN
INTRAVENOUS | Status: DC
  Administered 2014-09-29: 08:00:00 via INTRAVENOUS

## 2014-09-28 MED ORDER — SODIUM CHLORIDE 0.9 % IV BOLUS (SEPSIS)
500.0000 mL | Freq: Once | INTRAVENOUS | Status: AC
  Administered 2014-09-28: 500 mL via INTRAVENOUS

## 2014-09-28 MED ORDER — MORPHINE SULFATE 4 MG/ML IJ SOLN
4.0000 mg | Freq: Once | INTRAMUSCULAR | Status: AC
  Administered 2014-09-28: 4 mg via INTRAVENOUS
  Filled 2014-09-28: qty 1

## 2014-09-28 MED ORDER — DEXTROSE 5 % IV SOLN
500.0000 mg | INTRAVENOUS | Status: DC
  Administered 2014-09-29: 500 mg via INTRAVENOUS
  Filled 2014-09-28 (×2): qty 500

## 2014-09-28 MED ORDER — DEXTROSE 5 % IV SOLN
1.0000 g | INTRAVENOUS | Status: DC
  Administered 2014-09-29: 1 g via INTRAVENOUS
  Filled 2014-09-28 (×2): qty 10

## 2014-09-28 MED ORDER — DEXTROSE 5 % IV SOLN
1.0000 g | Freq: Once | INTRAVENOUS | Status: AC
  Administered 2014-09-28: 1 g via INTRAVENOUS
  Filled 2014-09-28: qty 10

## 2014-09-28 MED ORDER — ACETAMINOPHEN 325 MG PO TABS
650.0000 mg | ORAL_TABLET | Freq: Once | ORAL | Status: AC
  Administered 2014-09-28: 650 mg via ORAL
  Filled 2014-09-28: qty 2

## 2014-09-28 MED ORDER — ONDANSETRON HCL 4 MG/2ML IJ SOLN
4.0000 mg | Freq: Once | INTRAMUSCULAR | Status: AC
  Administered 2014-09-28: 4 mg via INTRAVENOUS
  Filled 2014-09-28: qty 2

## 2014-09-28 MED ORDER — SODIUM CHLORIDE 0.9 % IV BOLUS (SEPSIS)
1000.0000 mL | Freq: Once | INTRAVENOUS | Status: AC
  Administered 2014-09-28: 1000 mL via INTRAVENOUS

## 2014-09-28 MED ORDER — IBUPROFEN 400 MG PO TABS
400.0000 mg | ORAL_TABLET | Freq: Once | ORAL | Status: AC
  Administered 2014-09-29: 400 mg via ORAL
  Filled 2014-09-28: qty 1

## 2014-09-28 MED ORDER — AZITHROMYCIN 250 MG PO TABS
500.0000 mg | ORAL_TABLET | Freq: Once | ORAL | Status: AC
  Administered 2014-09-28: 500 mg via ORAL
  Filled 2014-09-28: qty 2

## 2014-09-28 MED ORDER — INFLUENZA VAC SPLIT QUAD 0.5 ML IM SUSY
0.5000 mL | PREFILLED_SYRINGE | INTRAMUSCULAR | Status: DC
  Filled 2014-09-28: qty 0.5

## 2014-09-28 NOTE — ED Notes (Signed)
PA at bedside.

## 2014-09-28 NOTE — ED Notes (Signed)
Pt stated she was unable to void when asked for a urine sample.

## 2014-09-28 NOTE — ED Notes (Addendum)
Pt reports having lower back pain, urinary symptoms, headache and fever, n/v x 1 week.

## 2014-09-28 NOTE — ED Notes (Signed)
Pt taken off floor to xray.

## 2014-09-28 NOTE — ED Provider Notes (Signed)
CSN: [id number]     Arrival date & time 09/28/14  1315 History   First MD Initiated Contact with Patient 09/28/14 1331     Chief Complaint  Patient presents with  . Back Pain  . Emesis     (Consider location/radiation/quality/duration/timing/severity/associated sxs/prior Treatment) HPI   38 year old female c/o low back pain and frontal headache.  Pt felt ill x 1 week including frontal headache, constant associated with photophobia, nausea without vomit.  Has hx of migraine and this headache is similar.  Pt was on her menstrual cycle last week which she took NSAIDs but it did not help with headache.  C/o left sided lower back pain, which is dull, constant worse with movement or laying supine.  Pain has been chronic but worsening ongoing x 1 week.  Works at [employer] which worsening her sxs.  Pt has been taking ibuprofen daily which has helped some.  Pt also report intermittent fever and chills, but does not check temp.  Does report urinary urgency/frequency without burning on urination.  Report generalized weakness, and fatigue.  Denies vision changes, no v/d, abd pain, dysuria, hematuria, or vaginal discharge or vaginal bleeding.  Pt has one partner, not using contraception.    Past Medical History  Diagnosis Date  . Migraine   . Sarcoidosis    Past Surgical History  Procedure Laterality Date  . Cesarean section    . Knee surgery    . Cesarean section     History reviewed. No pertinent family history. History  Substance Use Topics  . Smoking status: Current Every Day Smoker -- 1.00 packs/day  . Smokeless tobacco: Not on file  . Alcohol Use: Yes     Comment: socially    OB History    No data available     Review of Systems  All other systems reviewed and are negative.     Allergies  Fish allergy; Naprosyn; and Sulfa antibiotics  Home Medications   Prior to Admission medications   Medication Sig Start Date End Date Taking? Authorizing Provider  HYDROcodone-acetaminophen  (NORCO) 5-325 MG per tablet Take 1-2 tablets by mouth every 6 (six) hours as needed for severe pain. 07/11/14   Fayrene Helper, PA-C  ibuprofen (ADVIL,MOTRIN) 200 MG tablet Take 800 mg by mouth every 6 (six) hours as needed for moderate pain.    Historical Provider, MD  ibuprofen (ADVIL,MOTRIN) 600 MG tablet Take 1 tablet (600 mg total) by mouth every 8 (eight) hours as needed for fever, headache, moderate pain or cramping. 07/11/14   Fayrene Helper, PA-C   BP 121/70 mmHg  Pulse 90  Temp(Src) 101.9 F (38.8 C) (Oral)  Resp 18  SpO2 98%  LMP 09/21/2014 Physical Exam  Constitutional: She is oriented to person, place, and time. She appears well-developed and well-nourished. No distress.  HENT:  Head: Atraumatic.  Mouth/Throat: Oropharynx is clear and moist.  Eyes: Conjunctivae and EOM are normal. Pupils are equal, round, and reactive to light.  Neck: Normal range of motion. Neck supple.  No nuchal rigidity  Cardiovascular: Normal rate, regular rhythm and intact distal pulses.  Exam reveals no gallop and no friction rub.   No murmur heard. Pulmonary/Chest: Effort normal and breath sounds normal. No respiratory distress. She has no wheezes. She has no rales.  Abdominal: Soft. Bowel sounds are normal. There is tenderness (mild generalized abd tenderness without guarding or rebound tenderness). There is no rebound and no guarding.  Negative Murphy sign, no pain at McBurney's point.  Genitourinary:  No cva tenderness  Musculoskeletal: She exhibits no tenderness (no midline spine tenderness, crepitus, or step off. no overlying skin changes).  5/5 strength to all 4 extremities  Neurological: She is alert and oriented to person, place, and time. GCS eye subscore is 4. GCS verbal subscore is 5. GCS motor subscore is 6.  Skin: No rash noted.  Psychiatric: She has a normal mood and affect.  Nursing note and vitals reviewed.   ED Course  Procedures (including critical care time)  2:40 PM Patient  presents complaining of dysuria and fever and generalized body aches. She does complain of some mild frontal headache similar to prior headaches. She has no focal neuro deficit on exam. She has no meningismal sign concerning for meningitis. She has a fairly benign abdomen with low suspicion for appendicitis or other acute emergent condition. Will check urine.  She does have an elevated temperature of 101.9, Tylenol given. She has no CVA tenderness concerning for kidney stone. No URI sxs.    4:47 PM Hgb 8.2, lower than baseline.  Hemoccult negative.  Recent menstruation may contribute to it.  Electrolytes are reassuring.  Urine with >80 ketone, ivf continues.  No UTI.  Given symptomatic anemia, will admit for further management.  5:13 PM CXR demonstrates L lower lobe pneumonia although pt denies having cough or SOB.  Will treat with zithromax and rocephin for CAP.    Labs Review Labs Reviewed  URINALYSIS, ROUTINE W REFLEX MICROSCOPIC - Abnormal; Notable for the following:    Color, Urine AMBER (*)    APPearance CLOUDY (*)    Bilirubin Urine SMALL (*)    Ketones, ur >80 (*)    Protein, ur 30 (*)    Leukocytes, UA TRACE (*)    All other components within normal limits  CBC WITH DIFFERENTIAL - Abnormal; Notable for the following:    RBC 2.84 (*)    Hemoglobin 8.2 (*)    HCT 24.7 (*)    Platelets 78 (*)    Neutrophils Relative % 85 (*)    Lymphocytes Relative 8 (*)    Lymphs Abs 0.5 (*)    All other components within normal limits  URINE MICROSCOPIC-ADD ON - Abnormal; Notable for the following:    Squamous Epithelial / LPF FEW (*)    All other components within normal limits  BASIC METABOLIC PANEL  HEPATIC FUNCTION PANEL  LIPASE, BLOOD  POC URINE PREG, ED  CBG MONITORING, ED  POC OCCULT BLOOD, ED    Imaging Review Dg Chest 2 View  09/28/2014   CLINICAL DATA:  Fever and chills since yesterday.  EXAM: CHEST  2 VIEW  COMPARISON:  11/01/2013  FINDINGS: Normal heart size and mediastinal  contours.  There is a lower lobe opacity best visualized laterally. No effusion or pneumothorax.  Intact bony thorax.  IMPRESSION: Right lower lobe pneumonia.   Electronically Signed   By: Tiburcio PeaJonathan  Watts M.D.   On: 09/28/2014 17:11     EKG Interpretation None      MDM   Final diagnoses:  Fever  Community acquired pneumonia  Anemia due to other cause    BP 115/66 mmHg  Pulse 70  Temp(Src) 98.9 F (37.2 C) (Oral)  Resp 21  SpO2 93%  LMP 09/21/2014  I have reviewed nursing notes and vital signs. I personally reviewed the imaging tests through PACS system  I reviewed available ER/hospitalization records thought the EMR     Fayrene HelperBowie Rolinda Impson, PA-C 09/28/14 1733  Mirian MoMatthew Gentry,  MD 10/02/14 1906

## 2014-09-28 NOTE — H&P (Signed)
History and Physical       Hospital Admission Note Date: 09/28/2014  Patient name: Carrie RuddleLatoya D Bahl Medical record number: [id number] Date of birth: 08/10/1976 Age: 38 y.o. Gender: female  [id number]:Patient has no PCP    Chief Complaint:  Fevers with chills, headache, myalgias  HPI:  Patient is a 38 year old female with no history of sarcoidosis,migraines presented to ED with fevers, chillsheadache, nausea, vomiting, myalgias for almost a week. 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 She also complained of left-sided lower back pain, dull and constant, myalgias, generalized weakness and fatigue. Today she noticed her temp was 102 with chills, hence she came to the ER. In ED, chest x-ray showed a right lung pneumonia,anemia with hemoglobin of 8.2, platelets 78K. No baseline labs available. Patient does report menorrhagia with cycle lasting about 8 days. Reports that she did not have flu shot yet.   Review of Systems:  Constitutional: + fever, chills, diaphoresis, poor appetite and fatigue.  HEENT: Denies photophobia, eye pain, redness, hearing loss, ear pain, + congestion, sore throat, rhinorrhea, sneezing, mouth sores, trouble swallowing, neck pain, neck stiffness and tinnitus.   Respiratory: Denies SOB, DOE, cough, chest tightness,  and wheezing.   Cardiovascular: Denies chest pain, palpitations and leg swelling.  Gastrointestinal: + nausea, vomiting, denies abdominal pain, diarrhea, constipation, blood in stool and abdominal distention.  Genitourinary: Denies dysuria, urgency, frequency, hematuria, flank pain and difficulty urinating.  Musculoskeletal: +myalgias, back pain, deniesjoint swelling, arthralgias and gait problem.  Skin: Denies pallor, rash and wound.  Neurological: endorses dizziness, generalized weakness,  lightheadedness, headache denies any syncope, seizures Hematological: Denies adenopathy. Easy bruising, personal or family bleeding history  Psychiatric/Behavioral: Denies suicidal ideation, mood changes, confusion, nervousness, sleep disturbance and agitation  Past Medical History: Past Medical History  Diagnosis Date  . Migraine   . Sarcoidosis    Past Surgical History  Procedure Laterality Date  . Cesarean section    . Knee surgery    . Cesarean section      Medications: Prior to Admission medications   Medication Sig Start Date End Date Taking? Authorizing Provider  ibuprofen (ADVIL,MOTRIN) 200 MG tablet Take 800 mg by mouth every 6 (six) hours as needed (pain).   Yes Historical Provider, MD    Allergies:   Allergies  Allergen Reactions  . Fish Allergy Anaphylaxis and Shortness Of Breath  . Naprosyn [Naproxen] Anxiety and Other (See Comments)    Vaginal bleeding and anxiety  . Sulfa Antibiotics Anxiety and Other (See Comments)    Vaginal bleeding and anxiety    Social History:  reports that she has been smoking.  She does not have any smokeless tobacco history on file. She reports that she drinks alcohol. She reports that she does not use illicit drugs.  Family History: History reviewed. No pertinent family history.  Physical Exam: Blood pressure 115/66, pulse 70, temperature 98.9 F (37.2 C), temperature source Oral, resp. rate 21, last menstrual period 09/21/2014, SpO2 93 %. General: Alert, awake, oriented x3, in no acute distress. HEENT: normocephalic, atraumatic, anicteric sclera, pink conjunctiva, pupils equal and reactive to light and accomodation, oropharynx clear Neck: supple, no masses or lymphadenopathy, no goiter, no bruits  Heart: Regular rate and rhythm, without murmurs, rubs or gallops. Lungs: crackles in the right lower lung base. Abdomen: Soft, nontender, nondistended, positive bowel sounds, no masses. Extremities: No clubbing, cyanosis or  edema  with positive pedal pulses. Neuro: Grossly intact, no focal neurological deficits, strength 5/5 upper and lower extremities bilaterally Psych: alert and oriented x 3, normal mood and affect Skin: no rashes or lesions, warm and dry   LABS on Admission:  Basic Metabolic Panel:  Recent Labs Lab 09/28/14 1510  NA 135*  K 4.1  CL 96  CO2 24  GLUCOSE 100*  BUN 7  CREATININE 0.80  CALCIUM 8.8   Liver Function Tests:  Recent Labs Lab 09/28/14 1510  AST 28  ALT 10  ALKPHOS 67  BILITOT 0.9  PROT 7.9  ALBUMIN 3.3*    Recent Labs Lab 09/28/14 1510  LIPASE 24   No results for input(s): AMMONIA in the last 168 hours. CBC:  Recent Labs Lab 09/28/14 1510  WBC 6.0  NEUTROABS 5.1  HGB 8.2*  HCT 24.7*  MCV 87.0  PLT 78*   Cardiac Enzymes: No results for input(s): CKTOTAL, CKMB, CKMBINDEX, TROPONINI in the last 168 hours. BNP: Invalid input(s): POCBNP CBG:  Recent Labs Lab 09/28/14 1540  GLUCAP 95     Radiological Exams on Admission: Dg Chest 2 View  09/28/2014   CLINICAL DATA:  Fever and chills since yesterday.  EXAM: CHEST  2 VIEW  COMPARISON:  11/01/2013  FINDINGS: Normal heart size and mediastinal contours.  There is a lower lobe opacity best visualized laterally. No effusion or pneumothorax.  Intact bony thorax.  IMPRESSION: Right lower lobe pneumonia.   Electronically Signed   By: Tiburcio PeaJonathan  Watts M.D.   On: 09/28/2014 17:11    Assessment/Plan Principal Problem:   Community acquired pneumonia - Admit to MedSurg, placed on IV Zithromax, Rocephin - Obtain urine legionella antigen, urine strep antigen, HIV, influenza panel, blood cultures, sputum cultures  Active Problems:   Anemia: history of menorrhagia, cycles lasting about 8 days, heavy, FOBT negative - Will check anemia panel, LDH, haptoglobin - obtain pelvic ultrasound to rule out any fibroids,    Thrombocytopenia- chronicity unclear, etiology unknown, unclear baseline, possibly due to acute  illness - check LDH, haptoglobin, DIC panel, diff with smear  DVT prophylaxis: SCD's  CODE STATUS: Full code  Family Communication: no family member at the bedside  Further plan will depend as patient's clinical course evolves and further radiologic and laboratory data become available.   Time Spent on Admission: 50 minutes  RAI,RIPUDEEP M.D. [facility] 09/28/2014, 5:34 PM Pager: [phone]  If 7PM-7AM, please contact night-coverage [email or web address] Password [email or web address]

## 2014-09-28 NOTE — ED Notes (Addendum)
Encouraged Pt to provide a urine sample when she is able.

## 2014-09-28 NOTE — ED Notes (Signed)
Attempted to call report

## 2014-09-29 ENCOUNTER — Encounter (HOSPITAL_COMMUNITY): Payer: Self-pay | Admitting: Radiology

## 2014-09-29 ENCOUNTER — Inpatient Hospital Stay (HOSPITAL_COMMUNITY): Payer: Medicaid Other

## 2014-09-29 LAB — CBC
HCT: 30 % — ABNORMAL LOW (ref 36.0–46.0)
Hemoglobin: 9.6 g/dL — ABNORMAL LOW (ref 12.0–15.0)
MCH: 28.2 pg (ref 26.0–34.0)
MCHC: 32 g/dL (ref 30.0–36.0)
MCV: 88 fL (ref 78.0–100.0)
PLATELETS: 241 10*3/uL (ref 150–400)
RBC: 3.41 MIL/uL — AB (ref 3.87–5.11)
RDW: 13.5 % (ref 11.5–15.5)
WBC: 8.7 10*3/uL (ref 4.0–10.5)

## 2014-09-29 LAB — HIV ANTIBODY (ROUTINE TESTING W REFLEX): HIV 1&2 Ab, 4th Generation: NONREACTIVE

## 2014-09-29 LAB — BASIC METABOLIC PANEL
ANION GAP: 14 (ref 5–15)
BUN: 6 mg/dL (ref 6–23)
CHLORIDE: 105 meq/L (ref 96–112)
CO2: 23 mEq/L (ref 19–32)
Calcium: 8.3 mg/dL — ABNORMAL LOW (ref 8.4–10.5)
Creatinine, Ser: 0.79 mg/dL (ref 0.50–1.10)
GFR calc Af Amer: >90 mL/min (ref >90)
Glucose, Bld: 103 mg/dL — ABNORMAL HIGH (ref 70–99)
POTASSIUM: 3.5 meq/L — AB (ref 3.7–5.3)
SODIUM: 142 meq/L (ref 137–147)

## 2014-09-29 LAB — FERRITIN: Ferritin: 107 ng/mL (ref 10–291)

## 2014-09-29 LAB — HAPTOGLOBIN: HAPTOGLOBIN: 274 mg/dL — AB (ref 45–215)

## 2014-09-29 LAB — INFLUENZA PANEL BY PCR (TYPE A & B)
H1N1FLUPCR: NOT DETECTED
INFLAPCR: NEGATIVE
Influenza B By PCR: NEGATIVE

## 2014-09-29 LAB — IRON AND TIBC
Iron: 22 ug/dL — ABNORMAL LOW (ref 42–135)
Saturation Ratios: 7 % — ABNORMAL LOW (ref 20–55)
TIBC: 315 ug/dL (ref 250–470)
UIBC: 293 ug/dL (ref 125–400)

## 2014-09-29 LAB — VITAMIN B12: Vitamin B-12: 310 pg/mL (ref 211–911)

## 2014-09-29 LAB — FOLATE: FOLATE: 15.2 ng/mL

## 2014-09-29 MED ORDER — IOHEXOL 350 MG/ML SOLN
80.0000 mL | Freq: Once | INTRAVENOUS | Status: AC | PRN
  Administered 2014-09-29: 67 mL via INTRAVENOUS

## 2014-09-29 MED ORDER — FERROUS SULFATE 325 (65 FE) MG PO TABS
325.0000 mg | ORAL_TABLET | Freq: Three times a day (TID) | ORAL | Status: DC
  Administered 2014-09-29 – 2014-09-30 (×2): 325 mg via ORAL
  Filled 2014-09-29 (×6): qty 1

## 2014-09-29 MED ORDER — AZITHROMYCIN 500 MG PO TABS
500.0000 mg | ORAL_TABLET | Freq: Once | ORAL | Status: AC
  Administered 2014-09-29: 500 mg via ORAL
  Filled 2014-09-29: qty 1

## 2014-09-29 MED ORDER — OXYCODONE-ACETAMINOPHEN 5-325 MG PO TABS
1.0000 | ORAL_TABLET | Freq: Three times a day (TID) | ORAL | Status: DC | PRN
  Administered 2014-09-29 (×2): 2 via ORAL
  Filled 2014-09-29 (×2): qty 2

## 2014-09-29 MED ORDER — POTASSIUM CHLORIDE CRYS ER 20 MEQ PO TBCR
40.0000 meq | EXTENDED_RELEASE_TABLET | Freq: Once | ORAL | Status: AC
  Administered 2014-09-29: 40 meq via ORAL
  Filled 2014-09-29: qty 2

## 2014-09-29 MED ORDER — ONDANSETRON HCL 4 MG PO TABS
4.0000 mg | ORAL_TABLET | Freq: Three times a day (TID) | ORAL | Status: DC | PRN
  Administered 2014-09-29: 4 mg via ORAL
  Filled 2014-09-29: qty 1

## 2014-09-29 NOTE — Progress Notes (Signed)
Multiple PIV attempts with ultrasound to obtain PIV, which has now infiltrated. Spoke with floor RN, to page MD, request PICC placement. Ronette DeterJessica Poff, RN IV team

## 2014-09-29 NOTE — Progress Notes (Signed)
CARE MANAGEMENT NOTE 09/29/2014  Patient:  [name],[name]   Account Number:  [id number]  Date Initiated:  09/29/2014  Documentation initiated by:  [facility],Jammi Morrissette  Subjective/Objective Assessment:   CAP     Action/Plan:   Anticipated DC Date:  10/01/2014   Anticipated DC Plan:  HOME/SELF CARE      DC Planning Services  CM consult  PCP issues      Choice offered to / List presented to:             Status of service:  Completed, signed off Medicare Important Message given?   (If response is "NO", the following Medicare IM given date fields will be blank) Date Medicare IM given:   Medicare IM given by:   Date Additional Medicare IM given:   Additional Medicare IM given by:    Discharge Disposition:  HOME/SELF CARE  Per UR Regulation:  Reviewed for med. necessity/level of care/duration of stay  If discussed at Long Length of Stay Meetings, dates discussed:    Comments:  09/29/2014 1700 NCM spoke to pt and states she just received Medicaid. She does not have PCP. [facility]. [facility] at time of dc. 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 Pt able to pay for her medications. Isidoro DonningAlesia Rhaya Coale RN CCM Case Mgmt phone [phone]

## 2014-09-29 NOTE — Plan of Care (Signed)
Problem: Phase II Progression Outcomes Goal: Encourage coughing & deep breathing Outcome: Progressing     

## 2014-09-29 NOTE — Progress Notes (Addendum)
PROGRESS NOTE  Dyann RuddleLatoya D Oki [id number] DOB: 05/05/1976 DOA: 09/28/2014 PCP: Default, Provider, MD  HPI: 38 yo female who presented to the ED with fever, chills, ha, nausea, vomiting, and myalgias x 1 week.  Fever of 102 noted at home with chills.  She also complained of left-sided lower back pain, dull and constant, myalgias, generalized weakness and fatigue.  CXR in ED showed right lung pneumonia.  Subjective: Today she notes feeling somewhat better but had fever, sweats, chills, and myalgias throughout the night.  Complains of poor appetite, nausea, dizziness, and ha.  Spotting also noted with LMP on 11/01.      Assessment/Plan: CAP: Stable. CXR shows RLL pneumonia.  Continue IVF, Rocephin ,and Azithromycin.  Influenza PCR negative, Urine legionella antigen in process, urine strep antigen negative, HIV negative, blood cultures, sputum cultures pending.  Patient SOB, dizzy, +tobacco use, complaining of dizziness, elevated ddimer.  Will check CTA chest to rule out PE.  Check orthostatics.  Anemia: Possibly due to menorrhagia- LMP 11/01.  H/H 9.6/30.0.  Will monitor CBC.        Thrombocytopenia: Resolved.  Likely lab error.  Platelet count initially 78 on recheck 241.  Retic count 28.5.    Menorrhagia versus heavy menses.  Pelvic and transvaginal u/s show no fibroids or other mass in uterus to explain menorrhagia.  Will have pt f/u with GYN after d/c to evaluate and determine possible treatments.         Sarcoidosis: Chronic problem.  Stable.    Tobacco abuse: Counseled cessation.  Pt interested in quitting.  Will give pt prescription for nicotine patches at d/c.      DVT Prophylaxis:  SCDs Diet: regular diet  Code Status: Full Family Communication: Pt is awake and alert.  Disposition Plan: Will d/c home when medically appropriate.    Consultants:  None  Procedures:  None  Antibiotics:  IV Azithromycin and Rocephin.  Will transition to PO Levaquin 500mg  at d/c.    Objective: Filed Vitals:   09/28/14 1818 09/28/14 1842 09/28/14 2111 09/29/14 0548  BP: 117/67 104/86 114/62 108/56  Pulse: 69 62 74 62  Temp:  99.5 F (37.5 C) 97.7 F (36.5 C) 98.5 F (36.9 C)  TempSrc:  Oral Oral Oral  Resp: 20 14 18 18   Height:  5\' 5"  (1.651 m)    Weight:  73.7 kg (162 lb 7.7 oz)    SpO2: 97% 99% 99% 99%    Intake/Output Summary (Last 24 hours) at 09/29/14 1047 Last data filed at 09/29/14 1006  Gross per 24 hour  Intake    270 ml  Output    250 ml  Net     20 ml   Filed Weights   09/28/14 1842  Weight: 73.7 kg (162 lb 7.7 oz)   Exam: General: NAD, fatigued, appears stated age  HEENT:   EOMI, Anicteic Sclera, MMM.  Neck: Supple, no JVD, no masses  Cardiovascular: RRR, S1 S2 auscultated, no rubs, murmurs or gallops.   Respiratory: Clear to auscultation bilaterally with equal chest rise.  No accessory muscle use.    Abdomen: Soft, nontender, nondistended, + bowel sounds  Extremities: warm dry without cyanosis clubbing or edema.   Skin: Without rashes exudates or nodules.   Psych: Normal affect and demeanor with intact judgement and insight   Data Reviewed: Basic Metabolic Panel:  Recent Labs Lab 09/28/14 1510 09/29/14 0500  NA 135* 142  K 4.1 3.5*  CL 96 105  CO2 24 23  GLUCOSE 100* 103*  BUN 7 6  CREATININE 0.80 0.79  CALCIUM 8.8 8.3*   Liver Function Tests:  Recent Labs Lab 09/28/14 1510  AST 28  ALT 10  ALKPHOS 67  BILITOT 0.9  PROT 7.9  ALBUMIN 3.3*    Recent Labs Lab 09/28/14 1510  LIPASE 24   CBC:  Recent Labs Lab 09/28/14 1510 09/28/14 1945 09/29/14 0500  WBC 6.0  --  8.7  NEUTROABS 5.1  --   --   HGB 8.2*  --  9.6*  HCT 24.7*  --  30.0*  MCV 87.0  --  88.0  PLT 78* 246 241   CBG:  Recent Labs Lab 09/28/14 1540  GLUCAP 95     Studies: Dg Chest 2 View  09/28/2014   CLINICAL DATA:  Fever and chills since yesterday.  EXAM: CHEST  2 VIEW  COMPARISON:  11/01/2013  FINDINGS: Normal heart size and  mediastinal contours.  There is a lower lobe opacity best visualized laterally. No effusion or pneumothorax.  Intact bony thorax.  IMPRESSION: Right lower lobe pneumonia.   Electronically Signed   By: Tiburcio PeaJonathan  Watts M.D.   On: 09/28/2014 17:11   [place] Transvaginal Non-ob  09/29/2014   CLINICAL DATA:  Menorrhagia  EXAM: TRANSABDOMINAL AND TRANSVAGINAL ULTRASOUND OF PELVIS  TECHNIQUE: Both transabdominal and transvaginal ultrasound examinations of the pelvis were performed. Transabdominal technique was performed for global imaging of the pelvis including uterus, ovaries, adnexal regions, and pelvic cul-de-sac. It was necessary to proceed with endovaginal exam following the transabdominal exam to visualize the endometrium and ovaries.  COMPARISON:  No similar prior exam is available at this institution for comparison or on [facility].  FINDINGS: Uterus  Measurements: 7.7 x 5.0 x 4.7 cm. No fibroids or other mass visualized.  Endometrium  Thickness: 3 mm, uniformly echogenic. No focal abnormality visualized.  Right ovary  Measurements: 2.7 x 2.3 x 2.0 cm, seen transabdominally only without focal abnormality. Normal appearance/no adnexal mass.  Left ovary  Measurements: 2.8 x 2.2 x 1.2 cm. Normal appearance/no adnexal mass.  Other findings  Trace free fluid in the cul de sac  IMPRESSION: Normal exam. No specific abnormality identified to explain the history of menorrhagia   Electronically Signed   By: Christiana PellantGretchen  Green M.D.   On: 09/29/2014 09:36   [place] Pelvis Complete  09/29/2014   CLINICAL DATA:  Menorrhagia  EXAM: TRANSABDOMINAL AND TRANSVAGINAL ULTRASOUND OF PELVIS  TECHNIQUE: Both transabdominal and transvaginal ultrasound examinations of the pelvis were performed. Transabdominal technique was performed for global imaging of the pelvis including uterus, ovaries, adnexal regions, and pelvic cul-de-sac. It was necessary to proceed with endovaginal exam following the transabdominal exam to visualize the endometrium  and ovaries.  COMPARISON:  No similar prior exam is available at this institution for comparison or on [facility].  FINDINGS: Uterus  Measurements: 7.7 x 5.0 x 4.7 cm. No fibroids or other mass visualized.  Endometrium  Thickness: 3 mm, uniformly echogenic. No focal abnormality visualized.  Right ovary  Measurements: 2.7 x 2.3 x 2.0 cm, seen transabdominally only without focal abnormality. Normal appearance/no adnexal mass.  Left ovary  Measurements: 2.8 x 2.2 x 1.2 cm. Normal appearance/no adnexal mass.  Other findings  Trace free fluid in the cul de sac  IMPRESSION: Normal exam. No specific abnormality identified to explain the history of menorrhagia   Electronically Signed   By: Christiana PellantGretchen  Green M.D.   On: 09/29/2014 09:36    Scheduled Meds: . azithromycin  500 mg Intravenous Q24H  . cefTRIAXone (ROCEPHIN)  IV  1 g Intravenous Q24H  . Influenza vac split quadrivalent PF  0.5 mL Intramuscular Tomorrow-1000  . potassium chloride  40 mEq Oral Once   Continuous Infusions: . sodium chloride 75 mL/hr at 09/29/14 1610    Principal Problem:   Community acquired pneumonia Active Problems:   Anemia   Menorrhagia   Thrombocytopenia   CAP (community acquired pneumonia)    Vicki Mallet PA-S Algis Downs, PA-C  [facility] Pager [phone]. If 7PM-7AM, please contact night-coverage at [email or web address], password [facility] 09/29/2014, 10:47 AM  LOS: 1 day       Patient seen and examined, chart and data base reviewed and note above edited.  I agree with the above assessment and plan.  For full details please see Mrs. Algis Downs PA note.  38 year old female admitted overnight last night with community-acquired pneumonia, she seems to be minimally improving with antibiotics, if she is afebrile overnight and feels better in the morning, we'll consider transitioning to by mouth levofloxacin and discharged home. She has mild anemia with evidence of iron deficiency, we'll start iron  supplementation. She needs a GYN follow-up as an outpatient for her chronic heavy masses.   Pamella Pert, MD [facility] [phone]

## 2014-09-30 LAB — BASIC METABOLIC PANEL
Anion gap: 11 (ref 5–15)
BUN: 4 mg/dL — AB (ref 6–23)
CALCIUM: 8.6 mg/dL (ref 8.4–10.5)
CO2: 25 mEq/L (ref 19–32)
Chloride: 102 mEq/L (ref 96–112)
Creatinine, Ser: 0.7 mg/dL (ref 0.50–1.10)
GFR calc Af Amer: >90 mL/min (ref >90)
GLUCOSE: 96 mg/dL (ref 70–99)
Potassium: 3.9 mEq/L (ref 3.7–5.3)
Sodium: 138 mEq/L (ref 137–147)

## 2014-09-30 LAB — URINE CULTURE

## 2014-09-30 LAB — LEGIONELLA ANTIGEN, URINE

## 2014-09-30 MED ORDER — LEVOFLOXACIN IN D5W 750 MG/150ML IV SOLN
750.0000 mg | INTRAVENOUS | Status: DC
  Filled 2014-09-30: qty 150

## 2014-09-30 MED ORDER — NICOTINE 21 MG/24HR TD PT24: 21.0000 mg | MEDICATED_PATCH | Freq: Every day | TRANSDERMAL | Status: DC

## 2014-09-30 MED ORDER — LEVOFLOXACIN 750 MG PO TABS
750.0000 mg | ORAL_TABLET | Freq: Every day | ORAL | Status: DC
  Administered 2014-09-30: 750 mg via ORAL
  Filled 2014-09-30: qty 1

## 2014-09-30 MED ORDER — LEVOFLOXACIN 750 MG PO TABS: 750.0000 mg | ORAL_TABLET | Freq: Every day | ORAL | Status: DC

## 2014-09-30 MED ORDER — FERROUS SULFATE 325 (65 FE) MG PO TABS: 325.0000 mg | ORAL_TABLET | Freq: Two times a day (BID) | ORAL | Status: DC

## 2014-09-30 MED ORDER — BUTALBITAL-APAP-CAFFEINE 50-325-40 MG PO TABS
1.0000 | ORAL_TABLET | Freq: Once | ORAL | Status: AC
  Administered 2014-09-30: 1 via ORAL
  Filled 2014-09-30: qty 1

## 2014-09-30 NOTE — Progress Notes (Signed)
09/30/14 Patient being discharged home today, no IV access, discharge instructions reviewed with patient.

## 2014-09-30 NOTE — Plan of Care (Signed)
Problem: Phase II Progression Outcomes Goal: Wean O2 if indicated Outcome: Completed/Met Date Met:  09/30/14 Goal: Pain controlled Outcome: Completed/Met Date Met:  09/30/14

## 2014-09-30 NOTE — Care Management Note (Signed)
    Page 1 of 1   09/30/2014     12:43:06 PM CARE MANAGEMENT NOTE 09/30/2014  Patient:  [name],[name]   Account Number:  [id number]  Date Initiated:  09/29/2014  Documentation initiated by:  [facility],ALESIA  Subjective/Objective Assessment:   CAP     Action/Plan:   Anticipated DC Date:  09/30/2014   Anticipated DC Plan:  HOME/SELF CARE      DC Planning Services  CM consult  PCP issues      Choice offered to / List presented to:             Status of service:  Completed, signed off Medicare Important Message given?   (If response is "NO", the following Medicare IM given date fields will be blank) Date Medicare IM given:   Medicare IM given by:   Date Additional Medicare IM given:   Additional Medicare IM given by:    Discharge Disposition:  HOME/SELF CARE  Per UR Regulation:  Reviewed for med. necessity/level of care/duration of stay  If discussed at Long Length of Stay Meetings, dates discussed:    Comments:  09/30/14 1241 Letha Capeeborah Lajuan Kovaleski RN, BSN [phone] patient is for dc today, she states she now has medicaid and there is a MD listed on her card but not sure who until she sees the card.  Patient can afford her medications.  09/29/2014 1700 NCM spoke to pt and states she just received Medicaid. She does not have PCP. [facility] at time of dc. 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 Pt able to pay for her medications. Isidoro DonningAlesia Shavis RN CCM Case Mgmt phone [phone]

## 2014-09-30 NOTE — Discharge Summary (Signed)
Physician Discharge Summary  [name]:[id number] DOB: May 22, 1976 DOA: 09/28/2014  PCP: Default, Provider, MD  Admit date: 09/28/2014 Discharge date: 09/30/2014  Time spent: 45 minutes  Recommendations for Outpatient Follow-up:  Follow up cxr in 3-4 weeks to check for clearing of the right lobe pna. Follow up anemia.  Patient started on iron supplementation.   Discharge Diagnoses:  Principal Problem:   Community acquired pneumonia Active Problems:   Anemia   Menorrhagia   Thrombocytopenia   CAP (community acquired pneumonia)   Discharge Condition: stable.  Diet recommendation: regular  Filed Weights   09/28/14 1842  Weight: 73.7 kg (162 lb 7.7 oz)    History of present illness:  38 yo female who presented to the ED with fever, chills, ha, nausea, vomiting, and myalgias x 1 week. Fever of 102 noted at home with chills. She also complained of left-sided lower back pain, dull and constant, myalgias, generalized weakness and fatigue. CXR in ED showed right lung pneumonia.  Hospital Course:   CAP: Stable. CXR showed RLL pneumonia.Patient was treated with  IV Rocephin and Azithromycin. Influenza PCR negative, Urine legionella antigen negative, urine strep antigen negative, HIV negative, blood cultures show no growth to date, but are not yet finalized.   Patient SOB, dizzy, +tobacco use, complaining of dizziness, elevated ddimer. CTA chest is negative for pulmonary embolus.  Orthostatics are negative.  Patient still complains of HA but is overall improved and can recover at home.  She will be discharged on 5 days of oral levaquin.    Anemia: Possibly due to menorrhagia- LMP 11/01. H/H 9.6/30.0. CBC stable.  Started on iron supplementation.   Ref. Range 09/28/2014 19:45  Iron Latest Range: 42-135 ug/dL 22 (L)  UIBC Latest Range: 125-400 ug/dL 295  TIBC Latest Range: 250-470 ug/dL 284  Saturation Ratios Latest Range: 20-55 % 7 (L)  Ferritin Latest Range: 10-291  ng/mL 107  Folate No range found 15.2    Thrombocytopenia: Resolved. Likely lab error. Platelet count initially 78 on recheck 241. Retic count 28.5.   Menorrhagia versus heavy menses. Pelvic and transvaginal u/s show no fibroids or other mass in uterus to explain menorrhagia. Will have pt follow up with PCP/GYN after d/c to evaluate and determine possible treatments.    Sarcoidosis: Chronic problem. Stable.   Tobacco abuse: Counseled cessation. Pt interested in quitting. Will give pt prescription for nicotine patches at d/c.    Procedures:  none  Consultations:  none  Discharge Exam: Filed Vitals:   09/30/14 0628  BP: 118/67  Pulse: 60  Temp: 99.9 F (37.7 C)  Resp: 18    General: Wd, Wn, Female, A&O, sitting up in bed. Cardiovascular: RRR, no m/r/g, no lower extremity edema Respiratory: CTA no w/c/r Abdomen:  Soft, nt, nd, +bs, no masses Extremities:  No swelling, able to move all 4 ext.  Discharge Instructions   Discharge Instructions    Diet - low sodium heart healthy    Complete by:  As directed      Increase activity slowly    Complete by:  As directed           Current Discharge Medication List    START taking these medications   Details  ferrous sulfate 325 (65 FE) MG tablet Take 1 tablet (325 mg total) by mouth 2 (two) times daily with a meal. Qty: 60 tablet, Refills: 2    levofloxacin (LEVAQUIN) 750 MG tablet Take 1 tablet (750 mg total) by mouth daily. Qty: 5  tablet, Refills: 0    nicotine (EQ NICOTINE) 21 mg/24hr patch Place 1 patch (21 mg total) onto the skin daily. Absolutely do not wear the patch and smoke cigarettes. Qty: 14 patch, Refills: 0      STOP taking these medications     ibuprofen (ADVIL,MOTRIN) 200 MG tablet        Allergies  Allergen Reactions  . Fish Allergy Anaphylaxis and Shortness Of Breath  . Naprosyn [Naproxen] Anxiety and Other (See Comments)    Vaginal bleeding and anxiety  . Sulfa  Antibiotics Anxiety and Other (See Comments)    Vaginal bleeding and anxiety   Follow-up Information    Please follow up.   Why:  Please call the MD on your medicaid card and ask to be seen as soon as possible.       The results of significant diagnostics from this hospitalization (including imaging, microbiology, ancillary and laboratory) are listed below for reference.    Significant Diagnostic Studies: Dg Chest 2 View  09/28/2014   CLINICAL DATA:  Fever and chills since yesterday.  EXAM: CHEST  2 VIEW  COMPARISON:  11/01/2013  FINDINGS: Normal heart size and mediastinal contours.  There is a lower lobe opacity best visualized laterally. No effusion or pneumothorax.  Intact bony thorax.  IMPRESSION: Right lower lobe pneumonia.   Electronically Signed   By: Tiburcio PeaJonathan  Watts M.D.   On: 09/28/2014 17:11   Ct Angio Chest Pe W/cm &/or Wo Cm  09/29/2014   CLINICAL DATA:  fever, chills, ha, nausea, vomiting, and myalgias x 1 week. Fever of 102 noted at home with chills. She also complained of left-sided lower back pain, dull and constant, myalgias, generalized weakness and fatigue. CXR in ED showed right lung pneumonia  EXAM: CT ANGIOGRAPHY CHEST WITH CONTRAST  TECHNIQUE: Multidetector CT imaging of the chest was performed using the standard protocol during bolus administration of intravenous contrast. Multiplanar CT image reconstructions and MIPs were obtained to evaluate the vascular anatomy.  CONTRAST:  67mL OMNIPAQUE IOHEXOL 350 MG/ML SOLN  COMPARISON:  CT 06/02/2009 and chest x-ray today.  FINDINGS: Lungs are adequately inflated and demonstrate moderate a low-density consolidation over the right lower lobe with small right-sided effusion. Findings likely due to infection. Remainder of the lungs are clear. Possible mild low density right hilar and subcarinal adenopathy likely reactive. There is mild cardiomegaly. There is no evidence of pulmonary embolism. Minimal low density material over the to  mediastinum unchanged. Slight increased number and prominence of bilateral axillary lymph nodes unchanged.  Images through the upper abdomen demonstrate no definite focal abnormality. Remaining bones soft tissues are within normal.  Review of the MIP images confirms the above findings.  IMPRESSION: Low density consolidation over the right lower lobe with small associated right pleural effusion. Findings are likely due to pneumonia. Mild reactive right hilar and subcarinal adenopathy. Recommend follow-up to resolution.  Mild cardiomegaly.   Electronically Signed   By: Elberta Fortisaniel  Boyle M.D.   On: 09/29/2014 19:05   [place] Pelvis and Transvaginal Complete  09/29/2014   CLINICAL DATA:  Menorrhagia  EXAM: TRANSABDOMINAL AND TRANSVAGINAL ULTRASOUND OF PELVIS  TECHNIQUE: Both transabdominal and transvaginal ultrasound examinations of the pelvis were performed. Transabdominal technique was performed for global imaging of the pelvis including uterus, ovaries, adnexal regions, and pelvic cul-de-sac. It was necessary to proceed with endovaginal exam following the transabdominal exam to visualize the endometrium and ovaries.  COMPARISON:  No similar prior exam is available at this institution for comparison  or on [facility] PACS.  FINDINGS: Uterus  Measurements: 7.7 x 5.0 x 4.7 cm. No fibroids or other mass visualized.  Endometrium  Thickness: 3 mm, uniformly echogenic. No focal abnormality visualized.  Right ovary  Measurements: 2.7 x 2.3 x 2.0 cm, seen transabdominally only without focal abnormality. Normal appearance/no adnexal mass.  Left ovary  Measurements: 2.8 x 2.2 x 1.2 cm. Normal appearance/no adnexal mass.  Other findings  Trace free fluid in the cul de sac  IMPRESSION: Normal exam. No specific abnormality identified to explain the history of menorrhagia   Electronically Signed   By: Christiana PellantGretchen  Green M.D.   On: 09/29/2014 09:36    Microbiology: Recent Results (from the past 240 hour(s))  Culture, blood (routine x 2) Call  MD if unable to obtain prior to antibiotics being given     Status: None (Preliminary result)   Collection Time: 09/28/14  7:45 PM  Result Value Ref Range Status   Specimen Description BLOOD LEFT ANTECUBITAL  Final   Special Requests BOTTLES DRAWN AEROBIC ONLY 5CC  Final   Culture  Setup Time   Final    09/29/2014 00:32 Performed at [facility]    Culture   Final           BLOOD CULTURE RECEIVED NO GROWTH TO DATE CULTURE WILL BE HELD FOR 5 DAYS BEFORE ISSUING A FINAL NEGATIVE REPORT Performed at [facility]    Report Status PENDING  Incomplete  Culture, blood (routine x 2) Call MD if unable to obtain prior to antibiotics being given     Status: None (Preliminary result)   Collection Time: 09/28/14  7:53 PM  Result Value Ref Range Status   Specimen Description BLOOD RIGHT ANTECUBITAL  Final   Special Requests BOTTLES DRAWN AEROBIC ONLY 3CC  Final   Culture  Setup Time   Final    09/29/2014 00:32 Performed at [facility]    Culture   Final           BLOOD CULTURE RECEIVED NO GROWTH TO DATE CULTURE WILL BE HELD FOR 5 DAYS BEFORE ISSUING A FINAL NEGATIVE REPORT Performed at [facility]    Report Status PENDING  Incomplete  Urine culture     Status: None   Collection Time: 09/28/14  9:07 PM  Result Value Ref Range Status   Specimen Description URINE, CLEAN CATCH  Final   Special Requests NONE  Final   Culture  Setup Time   Final    09/29/2014 00:33 Performed at [facility]    Colony Count   Final    75,000 COLONIES/ML Performed at [facility]    Culture   Final    Multiple bacterial morphotypes present, none predominant. Suggest appropriate recollection if clinically indicated. Performed at [facility]    Report Status 09/30/2014 FINAL  Final     Labs: Basic Metabolic Panel:  Recent Labs Lab 09/28/14 1510 09/29/14 0500 09/30/14 0611  NA 135* 142 138  K 4.1 3.5* 3.9  CL 96 105 102  CO2 24 23 25    GLUCOSE 100* 103* 96  BUN 7 6 4*  CREATININE 0.80 0.79 0.70  CALCIUM 8.8 8.3* 8.6   Liver Function Tests:  Recent Labs Lab 09/28/14 1510  AST 28  ALT 10  ALKPHOS 67  BILITOT 0.9  PROT 7.9  ALBUMIN 3.3*    Recent Labs Lab 09/28/14 1510  LIPASE 24   CBC:  Recent Labs Lab 09/28/14 1510 09/28/14  1945 09/29/14 0500  WBC 6.0  --  8.7  NEUTROABS 5.1  --   --   HGB 8.2*  --  9.6*  HCT 24.7*  --  30.0*  MCV 87.0  --  88.0  PLT 78* 246 241   CBG:  Recent Labs Lab 09/28/14 1540  GLUCAP 95       Signed:  Conley Canal  [facility] 09/30/2014, 11:38 AM

## 2014-10-05 LAB — CULTURE, BLOOD (ROUTINE X 2)
Culture: NO GROWTH
Culture: NO GROWTH

## 2014-10-09 ENCOUNTER — Emergency Department (HOSPITAL_COMMUNITY)
Admission: EM | Admit: 2014-10-09 | Discharge: 2014-10-09 | Disposition: A | Discharge status: Home / Self Care | Payer: Medicaid Other | Attending: Emergency Medicine | Admitting: Emergency Medicine

## 2014-10-09 ENCOUNTER — Emergency Department (HOSPITAL_COMMUNITY): Payer: Medicaid Other

## 2014-10-09 ENCOUNTER — Encounter (HOSPITAL_COMMUNITY): Payer: Self-pay | Admitting: Emergency Medicine

## 2014-10-09 DIAGNOSIS — Z8619 Personal history of other infectious and parasitic diseases: Secondary | ICD-10-CM | POA: Diagnosis not present

## 2014-10-09 DIAGNOSIS — Z720 Tobacco use: Secondary | ICD-10-CM | POA: Diagnosis not present

## 2014-10-09 DIAGNOSIS — Z8701 Personal history of pneumonia (recurrent): Secondary | ICD-10-CM | POA: Insufficient documentation

## 2014-10-09 DIAGNOSIS — Z792 Long term (current) use of antibiotics: Secondary | ICD-10-CM | POA: Insufficient documentation

## 2014-10-09 DIAGNOSIS — Z3202 Encounter for pregnancy test, result negative: Secondary | ICD-10-CM | POA: Diagnosis not present

## 2014-10-09 DIAGNOSIS — R110 Nausea: Secondary | ICD-10-CM | POA: Diagnosis not present

## 2014-10-09 DIAGNOSIS — R51 Headache: Secondary | ICD-10-CM | POA: Diagnosis present

## 2014-10-09 DIAGNOSIS — Z79899 Other long term (current) drug therapy: Secondary | ICD-10-CM | POA: Insufficient documentation

## 2014-10-09 DIAGNOSIS — Z8679 Personal history of other diseases of the circulatory system: Secondary | ICD-10-CM | POA: Diagnosis not present

## 2014-10-09 DIAGNOSIS — J189 Pneumonia, unspecified organism: Secondary | ICD-10-CM

## 2014-10-09 DIAGNOSIS — R519 Headache, unspecified: Secondary | ICD-10-CM

## 2014-10-09 LAB — URINALYSIS, ROUTINE W REFLEX MICROSCOPIC
Bilirubin Urine: NEGATIVE
Glucose, UA: NEGATIVE mg/dL
HGB URINE DIPSTICK: NEGATIVE
Ketones, ur: NEGATIVE mg/dL
Leukocytes, UA: NEGATIVE
Nitrite: NEGATIVE
PROTEIN: NEGATIVE mg/dL
Specific Gravity, Urine: 1.004 — ABNORMAL LOW (ref 1.005–1.030)
Urobilinogen, UA: 0.2 mg/dL (ref 0.0–1.0)
pH: 6 (ref 5.0–8.0)

## 2014-10-09 LAB — I-STAT CHEM 8, ED
BUN: 5 mg/dL — ABNORMAL LOW (ref 6–23)
CALCIUM ION: 1.23 mmol/L (ref 1.12–1.23)
Chloride: 104 mEq/L (ref 96–112)
Creatinine, Ser: 0.7 mg/dL (ref 0.50–1.10)
Glucose, Bld: 91 mg/dL (ref 70–99)
HCT: 39 % (ref 36.0–46.0)
HEMOGLOBIN: 13.3 g/dL (ref 12.0–15.0)
Potassium: 3.9 mEq/L (ref 3.7–5.3)
SODIUM: 140 meq/L (ref 137–147)
TCO2: 24 mmol/L (ref 0–100)

## 2014-10-09 LAB — POC URINE PREG, ED: Preg Test, Ur: NEGATIVE

## 2014-10-09 MED ORDER — ONDANSETRON 4 MG PO TBDP: 4.0000 mg | ORAL_TABLET | Freq: Three times a day (TID) | ORAL | Status: DC | PRN

## 2014-10-09 MED ORDER — IBUPROFEN 600 MG PO TABS: 600.0000 mg | ORAL_TABLET | Freq: Four times a day (QID) | ORAL | Status: DC | PRN

## 2014-10-09 MED ORDER — ONDANSETRON 4 MG PO TBDP
4.0000 mg | ORAL_TABLET | Freq: Once | ORAL | Status: AC
  Administered 2014-10-09: 4 mg via ORAL
  Filled 2014-10-09: qty 1

## 2014-10-09 MED ORDER — IBUPROFEN 200 MG PO TABS
600.0000 mg | ORAL_TABLET | Freq: Once | ORAL | Status: AC
  Administered 2014-10-09: 600 mg via ORAL
  Filled 2014-10-09: qty 3

## 2014-10-09 NOTE — ED Notes (Signed)
Pt presents with HA and nausea x 1 day, pt states she feels like her PNA has returned. [facility].

## 2014-10-09 NOTE — ED Provider Notes (Signed)
CSN: [id number]     Arrival date & time 10/09/14  0554 History   First MD Initiated Contact with Patient 10/09/14 0600     Chief Complaint  Patient presents with  . Headache  . Nausea     (Consider location/radiation/quality/duration/timing/severity/associated sxs/prior Treatment) HPI Comments: Patients with history of recent RLL pneumonia admitted to the hospital 11/8-11/08/2014. She also had CT angio that was negative for pulmonary embolism but did demonstrate right lower lobe infiltrate as seen on initial x-ray. She actually did not have any pulmonary symptoms at that time. Her presenting complaint was fever, headache, and nausea. Patient presents this morning with worsening of symptoms over similar to previous symptoms with pneumonia. She does not can complain of any shortness of breath or cough area and she has not had additional fever. Her current complaint is only nausea without vomiting as well as a mild frontal headache. Patient was discharged home on levofloxacin and she states that she took the entire course. No treatments prior to arrival. No abdominal pain or chest pain. No dysuria, hematuria, or increased frequency. She denies recent travel. She c/o night sweats. No significant risk factors for TB.   Patient had also has anemia due to iron deficiency. No syncope.  Patient is a 38 y.o. female presenting with headaches. The history is provided by the patient and medical records.  Headache Associated symptoms: nausea   Associated symptoms: no abdominal pain, no cough, no diarrhea, no fever, no myalgias, no sore throat and no vomiting     Past Medical History  Diagnosis Date  . Migraine   . Sarcoidosis    Past Surgical History  Procedure Laterality Date  . Cesarean section    . Knee surgery    . Cesarean section     No family history on file. History  Substance Use Topics  . Smoking status: Current Every Day Smoker -- 1.00 packs/day  . Smokeless tobacco: Not on file  .  Alcohol Use: Yes     Comment: socially    OB History    No data available     Review of Systems  Constitutional: Negative for fever.  HENT: Negative for rhinorrhea and sore throat.   Eyes: Negative for redness.  Respiratory: Negative for cough and shortness of breath.   Cardiovascular: Negative for chest pain and leg swelling.  Gastrointestinal: Positive for nausea. Negative for vomiting, abdominal pain and diarrhea.  Genitourinary: Negative for dysuria.  Musculoskeletal: Negative for myalgias.  Skin: Negative for rash.  Neurological: Positive for headaches.    Allergies  Fish allergy; Naprosyn; and Sulfa antibiotics  Home Medications   Prior to Admission medications   Medication Sig Start Date End Date Taking? Authorizing Provider  ferrous sulfate 325 (65 FE) MG tablet Take 1 tablet (325 mg total) by mouth 2 (two) times daily with a meal. 09/30/14  Yes Tora Kindred York, PA-C  nicotine (EQ NICOTINE) 21 mg/24hr patch Place 1 patch (21 mg total) onto the skin daily. Absolutely do not wear the patch and smoke cigarettes. 09/30/14  Yes Marianne L York, PA-C  levofloxacin (LEVAQUIN) 750 MG tablet Take 1 tablet (750 mg total) by mouth daily. 09/30/14   Marianne L York, PA-C   BP 117/79 mmHg  Pulse 55  Temp(Src) 97.5 F (36.4 C) (Oral)  Resp 18  Ht 5\' 2"  (1.575 m)  Wt 160 lb (72.576 kg)  BMI 29.26 kg/m2  SpO2 97%  LMP 09/21/2014   Physical Exam  Constitutional: She is oriented to  person, place, and time. She appears well-developed and well-nourished.  HENT:  Head: Normocephalic and atraumatic.  Right Ear: Tympanic membrane, external ear and ear canal normal.  Left Ear: Tympanic membrane, external ear and ear canal normal.  Nose: Nose normal. No mucosal edema or rhinorrhea.  Mouth/Throat: Uvula is midline, oropharynx is clear and moist and mucous membranes are normal. Mucous membranes are not dry. No oral lesions. No trismus in the jaw. No uvula swelling. No oropharyngeal  exudate, posterior oropharyngeal edema, posterior oropharyngeal erythema or tonsillar abscesses.  Eyes: Conjunctivae, EOM and lids are normal. Pupils are equal, round, and reactive to light. Right eye exhibits no discharge. Left eye exhibits no discharge. Right eye exhibits no nystagmus. Left eye exhibits no nystagmus.  Neck: Normal range of motion. Neck supple.  Cardiovascular: Normal rate, regular rhythm and normal heart sounds.   Pulmonary/Chest: Effort normal and breath sounds normal. No respiratory distress. She has no wheezes. She has no rales.  Abdominal: Soft. There is no tenderness.  Musculoskeletal:       Cervical back: She exhibits normal range of motion, no tenderness and no bony tenderness.  Lymphadenopathy:    She has no cervical adenopathy.  Neurological: She is alert and oriented to person, place, and time. She has normal strength and normal reflexes. No cranial nerve deficit or sensory deficit. She displays a negative Romberg sign. Coordination and gait normal. GCS eye subscore is 4. GCS verbal subscore is 5. GCS motor subscore is 6.  Skin: Skin is warm and dry.  Psychiatric: She has a normal mood and affect.  Nursing note and vitals reviewed.   ED Course  Procedures (including critical care time) Labs Review Labs Reviewed  URINALYSIS, ROUTINE W REFLEX MICROSCOPIC - Abnormal; Notable for the following:    Specific Gravity, Urine 1.004 (*)    All other components within normal limits  I-STAT CHEM 8, ED - Abnormal; Notable for the following:    BUN 5 (*)    All other components within normal limits  POC URINE PREG, ED    Imaging Review Dg Chest 2 View  10/09/2014   CLINICAL DATA:  Nausea, shortness of breath, recent pneumonia.  EXAM: CHEST  2 VIEW  COMPARISON:  09/28/2014  FINDINGS: Heart size and pulmonary vascularity are normal. Previous left lung base consolidation is improving without significant residua. No focal airspace disease noted today. No blunting of  costophrenic angles. No pneumothorax.  IMPRESSION: Resolution of previous left lung base consolidation. No active lung disease today.   Electronically Signed   By: Burman NievesWilliam  Stevens M.D.   On: 10/09/2014 07:20     EKG Interpretation None      6:40 AM Patient seen and examined. Previous records reviewed. Will check i-STAT given anemia. Work-up initiated. Medications ordered. Patient appears very well.   Vital signs reviewed and are as follows: BP 117/79 mmHg  Pulse 55  Temp(Src) 97.5 F (36.4 C) (Oral)  Resp 18  Ht 5\' 2"  (1.575 m)  Wt 160 lb (72.576 kg)  BMI 29.26 kg/m2  SpO2 97%  LMP 09/21/2014   8:05 AM Patient informed of results. She is feeling somewhat better.  Patient counseled on supportive care for viral URI and s/s to return including worsening symptoms, persistent fever, persistent vomiting, or if they have any other concerns. Urged to see PCP if symptoms persist for more than 3 days. Patient verbalizes understanding and agrees with plan.     MDM   Final diagnoses:  Nausea  Acute nonintractable  headache, unspecified headache type   Pt with vague symptoms of nausea, fatigue, HA. These are similar to what she was admitted with last time when she had pneumonia. CXR much improved. No anemia. VSS. Patient appears very well. Lung clear. Abd soft, NT. No CP, SOB, or other anginal equivalent to suggest ACS. No indications for further work-up identified. No dangerous or life-threatening conditions suspected or identified by history, physical exam, and by work-up. No indications for hospitalization identified.     Renne CriglerJoshua Tera Pellicane, PA-C 10/09/14 [id number]  Gwyneth SproutWhitney Plunkett, MD 10/09/14 [phone]

## 2014-10-09 NOTE — Discharge Instructions (Signed)
Please read and follow all provided instructions.  Your diagnoses today include:  1. Nausea   2. Pneumonia   3. Acute nonintractable headache, unspecified headache type     Tests performed today include:  Chest x-ray - shows that your pneumonia is healed  Blood counts - normal red blood cells today  Urine test - no infection  Vital signs. See below for your results today.   Medications prescribed:   Zofran (ondansetron) - for nausea and vomiting   Ibuprofen (Motrin, Advil) - anti-inflammatory pain medication  Do not exceed 600mg  ibuprofen every 6 hours, take with food  You have been prescribed an anti-inflammatory medication or NSAID. Take with food. Take smallest effective dose for the shortest duration needed for your pain. Stop taking if you experience stomach pain or vomiting.   Take any prescribed medications only as directed.  Home care instructions:  Follow any educational materials contained in this packet.  BE VERY CAREFUL not to take multiple medicines containing Tylenol (also called acetaminophen). Doing so can lead to an overdose which can damage your liver and cause liver failure and possibly death.   Follow-up instructions: Please follow-up with your primary care provider in the next 3 days for further evaluation of your symptoms.   Return instructions:   Please return to the Emergency Department if you experience worsening symptoms.   Return with chest pain, shortness of breath, fever  Please return if you have any other emergent concerns.  Additional Information:  Your vital signs today were: BP 117/79 mmHg   Pulse 55   Temp(Src) 97.5 F (36.4 C) (Oral)   Resp 18   Ht 5\' 2"  (1.575 m)   Wt 160 lb (72.576 kg)   BMI 29.26 kg/m2   SpO2 97%   LMP 09/21/2014 If your blood pressure (BP) was elevated above 135/85 this visit, please have this repeated by your doctor within one month. --------------

## 2014-10-30 ENCOUNTER — Ambulatory Visit: Payer: Medicaid Other | Admitting: Obstetrics & Gynecology

## 2014-11-18 ENCOUNTER — Encounter: Payer: Self-pay | Admitting: Obstetrics & Gynecology

## 2014-12-23 ENCOUNTER — Ambulatory Visit: Payer: Medicaid Other | Admitting: Internal Medicine

## 2015-08-19 ENCOUNTER — Encounter (HOSPITAL_COMMUNITY): Payer: Self-pay | Admitting: Emergency Medicine

## 2015-08-19 ENCOUNTER — Emergency Department (HOSPITAL_COMMUNITY)
Admission: EM | Admit: 2015-08-19 | Discharge: 2015-08-19 | Disposition: A | Discharge status: Home / Self Care | Payer: Medicaid Other | Attending: Emergency Medicine | Admitting: Emergency Medicine

## 2015-08-19 DIAGNOSIS — S1086XA Insect bite of other specified part of neck, initial encounter: Secondary | ICD-10-CM | POA: Diagnosis present

## 2015-08-19 DIAGNOSIS — Z8679 Personal history of other diseases of the circulatory system: Secondary | ICD-10-CM | POA: Diagnosis not present

## 2015-08-19 DIAGNOSIS — Y9289 Other specified places as the place of occurrence of the external cause: Secondary | ICD-10-CM | POA: Insufficient documentation

## 2015-08-19 DIAGNOSIS — W57XXXA Bitten or stung by nonvenomous insect and other nonvenomous arthropods, initial encounter: Secondary | ICD-10-CM | POA: Diagnosis not present

## 2015-08-19 DIAGNOSIS — Z720 Tobacco use: Secondary | ICD-10-CM | POA: Insufficient documentation

## 2015-08-19 DIAGNOSIS — Y998 Other external cause status: Secondary | ICD-10-CM | POA: Diagnosis not present

## 2015-08-19 DIAGNOSIS — Z862 Personal history of diseases of the blood and blood-forming organs and certain disorders involving the immune mechanism: Secondary | ICD-10-CM | POA: Insufficient documentation

## 2015-08-19 DIAGNOSIS — Y9389 Activity, other specified: Secondary | ICD-10-CM | POA: Diagnosis not present

## 2015-08-19 LAB — URINALYSIS, ROUTINE W REFLEX MICROSCOPIC
BILIRUBIN URINE: NEGATIVE
Glucose, UA: NEGATIVE mg/dL
Hgb urine dipstick: NEGATIVE
Ketones, ur: NEGATIVE mg/dL
Leukocytes, UA: NEGATIVE
NITRITE: NEGATIVE
Protein, ur: NEGATIVE mg/dL
SPECIFIC GRAVITY, URINE: 1.018 (ref 1.005–1.030)
UROBILINOGEN UA: 2 mg/dL — AB (ref 0.0–1.0)
pH: 6 (ref 5.0–8.0)

## 2015-08-19 MED ORDER — TRIAMCINOLONE ACETONIDE 0.1 % EX CREA: 1.0000 "application " | TOPICAL_CREAM | Freq: Two times a day (BID) | CUTANEOUS | Status: DC

## 2015-08-19 NOTE — Discharge Instructions (Signed)
Follow-up with a primary care doctor.  Return here as needed.  Take Benadryl for itching

## 2015-08-19 NOTE — ED Provider Notes (Signed)
CSN: [id number]     Arrival date & time 08/19/15  1856 History   First MD Initiated Contact with Patient 08/19/15 2158     Chief Complaint  Patient presents with  . Insect Bite     (Consider location/radiation/quality/duration/timing/severity/associated sxs/prior Treatment) HPI   [name] is a 39 yo female presenting to the ED with insect bites. 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 A few days ago she noticed 4 bumps on her lateral neck behind her ear. The bumps are red and itchy. She is having trouble sleeping due to the itching. She has note tried any topical treatments or oral antihistamines. The patient states that these bites are very similar to when she had bed bugs in the past.  Past Medical History  Diagnosis Date  . Migraine   . Sarcoidosis    Past Surgical History  Procedure Laterality Date  . Cesarean section    . Knee surgery    . Cesarean section     No family history on file. Social History  Substance Use Topics  . Smoking status: Current Every Day Smoker -- 1.00 packs/day  . Smokeless tobacco: None  . Alcohol Use: Yes     Comment: socially    OB History    No data available     Review of Systems  All other systems negative except as documented in the HPI. All pertinent positives and negatives as reviewed in the HPI.  Allergies  Fish allergy; Naprosyn; and Sulfa antibiotics  Home Medications   Prior to Admission medications   Medication Sig Start Date End Date Taking? Authorizing Provider  ferrous sulfate 325 (65 FE) MG tablet Take 1 tablet (325 mg total) by mouth 2 (two) times daily with a meal. Patient not taking: Reported on 08/19/2015 09/30/14   Tora Kindred York, PA-C  ibuprofen (ADVIL,MOTRIN) 600 MG tablet Take 1 tablet (600 mg total) by mouth every 6 (six) hours as needed. Patient not taking: Reported on 08/19/2015 10/09/14   Renne Crigler, PA-C  levofloxacin (LEVAQUIN) 750 MG  tablet Take 1 tablet (750 mg total) by mouth daily. Patient not taking: Reported on 08/19/2015 09/30/14   Stephani Police, PA-C  nicotine (EQ NICOTINE) 21 mg/24hr patch Place 1 patch (21 mg total) onto the skin daily. Absolutely do not wear the patch and smoke cigarettes. Patient not taking: Reported on 08/19/2015 09/30/14   Tora Kindred York, PA-C  ondansetron (ZOFRAN ODT) 4 MG disintegrating tablet Take 1 tablet (4 mg total) by mouth every 8 (eight) hours as needed for nausea or vomiting. Patient not taking: Reported on 08/19/2015 10/09/14   Renne Crigler, PA-C   BP 130/80 mmHg  Pulse 56  Temp(Src) 97.9 F (36.6 C) (Oral)  Resp 16  SpO2 100%  LMP 08/05/2015 Physical Exam  Constitutional: She is oriented to person, place, and time. She appears well-developed and well-nourished. No distress.  HENT:  Head: Normocephalic and atraumatic.  Mouth/Throat: Oropharynx is clear and moist.  Eyes: Pupils are equal, round, and reactive to light.  Neck: Normal range of motion. Neck supple.  Cardiovascular: Normal rate, regular rhythm and normal heart sounds.   Pulmonary/Chest: Effort normal and breath sounds normal.  Neurological: She is alert and oriented to person, place, and time.  Skin: Skin is warm and dry. Rash noted.  Patient has several raised areas on the left side of her neck  Nursing note and vitals reviewed.  ED Course  Procedures (including critical care time) Labs Review Labs Reviewed  URINALYSIS, ROUTINE W REFLEX MICROSCOPIC (NOT AT [facility]) - Abnormal; Notable for the following:    APPearance CLOUDY (*)    Urobilinogen, UA 2.0 (*)    All other components within normal limits    The patient is advised to follow up with her PCP. Told to return here as needed.  Patient has an ill-defined rash, but could represent some sort of bite    Charlestine Night, PA-C 08/25/15 1428  Bethann Berkshire, MD 08/26/15 [phone]

## 2015-08-19 NOTE — ED Notes (Signed)
Pt also complains of having a UTI

## 2015-08-19 NOTE — ED Notes (Signed)
Per pt, states bites on left side of neck which she noticed yesterday

## 2015-08-19 NOTE — ED Notes (Signed)
Patient now states she is having UTI symptoms

## 2015-08-19 NOTE — ED Notes (Signed)
Pt refused vitals stating she needed to leave.

## 2015-08-28 ENCOUNTER — Emergency Department (HOSPITAL_COMMUNITY)
Admission: EM | Admit: 2015-08-28 | Discharge: 2015-08-28 | Discharge status: Against Medical Advice | Payer: Medicaid Other | Attending: Emergency Medicine | Admitting: Emergency Medicine

## 2015-08-28 ENCOUNTER — Encounter (HOSPITAL_COMMUNITY): Payer: Self-pay | Admitting: Emergency Medicine

## 2015-08-28 DIAGNOSIS — G43909 Migraine, unspecified, not intractable, without status migrainosus: Secondary | ICD-10-CM | POA: Diagnosis present

## 2015-08-28 DIAGNOSIS — Z720 Tobacco use: Secondary | ICD-10-CM | POA: Diagnosis not present

## 2015-08-28 NOTE — ED Notes (Signed)
Pt from home c/o migraine x 1 week. She reports taking exdrine without relief. Pt alert, oriented, and ambulatory. Warm blanket provided while patient waits. Pt states "this is ridiculous'.

## 2015-08-28 NOTE — ED Notes (Addendum)
Pt gave her labels to registration and reported that she is leaving.

## 2015-08-29 ENCOUNTER — Encounter (HOSPITAL_COMMUNITY): Payer: Self-pay | Admitting: Emergency Medicine

## 2015-08-29 ENCOUNTER — Emergency Department (HOSPITAL_COMMUNITY)
Admission: EM | Admit: 2015-08-29 | Discharge: 2015-08-29 | Disposition: A | Discharge status: Home / Self Care | Payer: Medicaid Other | Attending: Emergency Medicine | Admitting: Emergency Medicine

## 2015-08-29 DIAGNOSIS — Z7952 Long term (current) use of systemic steroids: Secondary | ICD-10-CM | POA: Insufficient documentation

## 2015-08-29 DIAGNOSIS — G43909 Migraine, unspecified, not intractable, without status migrainosus: Secondary | ICD-10-CM

## 2015-08-29 DIAGNOSIS — Z862 Personal history of diseases of the blood and blood-forming organs and certain disorders involving the immune mechanism: Secondary | ICD-10-CM | POA: Diagnosis not present

## 2015-08-29 DIAGNOSIS — R51 Headache: Secondary | ICD-10-CM | POA: Diagnosis present

## 2015-08-29 DIAGNOSIS — Z720 Tobacco use: Secondary | ICD-10-CM | POA: Insufficient documentation

## 2015-08-29 MED ORDER — SODIUM CHLORIDE 0.9 % IV BOLUS (SEPSIS)
1000.0000 mL | Freq: Once | INTRAVENOUS | Status: AC
  Administered 2015-08-29: 1000 mL via INTRAVENOUS

## 2015-08-29 MED ORDER — KETOROLAC TROMETHAMINE 30 MG/ML IJ SOLN
30.0000 mg | Freq: Once | INTRAMUSCULAR | Status: AC
  Administered 2015-08-29: 30 mg via INTRAVENOUS
  Filled 2015-08-29: qty 1

## 2015-08-29 MED ORDER — DIPHENHYDRAMINE HCL 50 MG/ML IJ SOLN
25.0000 mg | Freq: Once | INTRAMUSCULAR | Status: AC
  Administered 2015-08-29: 25 mg via INTRAVENOUS
  Filled 2015-08-29: qty 1

## 2015-08-29 MED ORDER — IBUPROFEN 600 MG PO TABS: 600.0000 mg | ORAL_TABLET | Freq: Four times a day (QID) | ORAL | Status: DC | PRN

## 2015-08-29 MED ORDER — METOCLOPRAMIDE HCL 5 MG/ML IJ SOLN
10.0000 mg | Freq: Once | INTRAMUSCULAR | Status: AC
  Administered 2015-08-29: 10 mg via INTRAVENOUS
  Filled 2015-08-29: qty 2

## 2015-08-29 NOTE — ED Notes (Signed)
Pt. reports migraine headache with nausea and photophobia onset last week unrelieved by OTC Exedrin tabs.

## 2015-08-29 NOTE — ED Provider Notes (Signed)
CSN: [id number]     Arrival date & time 08/29/15  0212 History  By signing my name below, I, Emmanuella Mensah, attest that this documentation has been prepared under the direction and in the presence of Derwood Kaplan, MD. Electronically Signed: Angelene Giovanni, ED Scribe. 08/29/2015. 2:52 AM.    Chief Complaint  Patient presents with  . Migraine   The history is provided by the patient. No language interpreter was used.   HPI Comments: [name] is a 39 y.o. female with a hx of Migraines who presents to the Emergency Department complaining of a constant, 9/10 gradually worsening, sharp, throbbing migraine headache onset 1 week ago. She reports associated nausea and photophobia. She denies any numbness or tingling. She reports that she took OTC Excedrin with no relief. She explains that she normally takes her migraine medication as needed but she has run out of that medication. She adds that they were prescribed by a Neurologist but does not see that physician any longer since her symptoms were treatable with Excedrin. She denies any birth control use. No recent travel. She reports that she is currently on her menstrual cycle and denies any correlation between her menstrual cycles and her migraine headaches.  Past Medical History  Diagnosis Date  . Migraine   . Sarcoidosis [facility])    Past Surgical History  Procedure Laterality Date  . Cesarean section    . Knee surgery    . Cesarean section     No family history on file. Social History  Substance Use Topics  . Smoking status: Current Every Day Smoker -- 0.00 packs/day    Types: Cigarettes  . Smokeless tobacco: None  . Alcohol Use: Yes   OB History    No data available     Review of Systems  Eyes: Positive for photophobia.  Gastrointestinal: Positive for nausea.  Neurological: Positive for headaches. Negative for numbness.      Allergies  Fish allergy; Naprosyn; and Sulfa antibiotics  Home Medications   Prior to  Admission medications   Medication Sig Start Date End Date Taking? Authorizing Provider  triamcinolone cream (KENALOG) 0.1 % Apply 1 application topically 2 (two) times daily. 08/19/15  Yes Charlestine Night, PA-C  ferrous sulfate 325 (65 FE) MG tablet Take 1 tablet (325 mg total) by mouth 2 (two) times daily with a meal. Patient not taking: Reported on 08/19/2015 09/30/14   Tora Kindred York, PA-C  ibuprofen (ADVIL,MOTRIN) 600 MG tablet Take 1 tablet (600 mg total) by mouth every 6 (six) hours as needed. 08/29/15   Derwood Kaplan, MD  levofloxacin (LEVAQUIN) 750 MG tablet Take 1 tablet (750 mg total) by mouth daily. Patient not taking: Reported on 08/19/2015 09/30/14   Stephani Police, PA-C  nicotine (EQ NICOTINE) 21 mg/24hr patch Place 1 patch (21 mg total) onto the skin daily. Absolutely do not wear the patch and smoke cigarettes. Patient not taking: Reported on 08/19/2015 09/30/14   Tora Kindred York, PA-C  ondansetron (ZOFRAN ODT) 4 MG disintegrating tablet Take 1 tablet (4 mg total) by mouth every 8 (eight) hours as needed for nausea or vomiting. Patient not taking: Reported on 08/19/2015 10/09/14   Renne Crigler, PA-C   BP 131/80 mmHg  Pulse 58  Temp(Src) 98 F (36.7 C) (Oral)  Resp 16  SpO2 99%  LMP 08/26/2015 Physical Exam  Constitutional: She is oriented to person, place, and time. She appears well-developed and well-nourished.  HENT:  Head: Normocephalic and atraumatic.  Eyes:  Pupils  are 5, equal and reactive   Cardiovascular: Normal rate.   Pulmonary/Chest: Effort normal.  Anterior lungs, bilateral breath sounds   Abdominal: She exhibits no distension.  Neurological: She is alert and oriented to person, place, and time.  Skin: Skin is warm and dry.  Psychiatric: She has a normal mood and affect.  Nursing note and vitals reviewed.   ED Course  Procedures (including critical care time) DIAGNOSTIC STUDIES: Oxygen Saturation is 99% on RA, normal by my interpretation.     COORDINATION OF CARE: 2:48 AM- Pt advised of plan for treatment and pt agrees.    Labs Review Labs Reviewed - No data to display  Imaging Review No results found. Derwood Kaplan, MD has personally reviewed and evaluated these images and lab results as part of his medical decision-making.   EKG Interpretation None     :00 am Upon reassessment, patient reports that the headache has resolved. She continued to have no neurologic complains. Strict return precautions discussed, pt will return to the ER if there is visual complains, seizures, altered mental status, loss of consciousness, dizziness, new focal weakness, or numbness.  MDM   Final diagnoses:  Migraine without status migrainosus, not intractable, unspecified migraine type    I personally performed the services described in this documentation, which was scribed in my presence. The recorded information has been reviewed and is accurate.  Pt comes in with cc of headaches. Pt has hx of migraines. The current headaches are typical of previous migraine. Pt has no focal findings. Will give headache and cocktail.   Derwood Kaplan, MD 08/29/15 [phone]

## 2015-08-29 NOTE — ED Notes (Signed)
IV attempt x 1 without success.

## 2015-08-29 NOTE — Discharge Instructions (Signed)
Recurrent Migraine Headache A migraine headache is an intense, throbbing pain on one or both sides of your head. Recurrent migraines keep coming back. A migraine can last for 30 minutes to several hours. CAUSES  The exact cause of a migraine headache is not always known. However, a migraine may be caused when nerves in the brain become irritated and release chemicals that cause inflammation. This causes pain. Certain things may also trigger migraines, such as:   Alcohol.  Smoking.  Stress.  Menstruation.  Aged cheeses.  Foods or drinks that contain nitrates, glutamate, aspartame, or tyramine.  Lack of sleep.  Chocolate.  Caffeine.  Hunger.  Physical exertion.  Fatigue.  Medicines used to treat chest pain (nitroglycerine), birth control pills, estrogen, and some blood pressure medicines. SYMPTOMS   Pain on one or both sides of your head.  Pulsating or throbbing pain.  Severe pain that prevents daily activities.  Pain that is aggravated by any physical activity.  Nausea, vomiting, or both.  Dizziness.  Pain with exposure to bright lights, loud noises, or activity.  General sensitivity to bright lights, loud noises, or smells. Before you get a migraine, you may get warning signs that a migraine is coming (aura). An aura may include:  Seeing flashing lights.  Seeing bright spots, halos, or zigzag lines.  Having tunnel vision or blurred vision.  Having feelings of numbness or tingling.  Having trouble talking.  Having muscle weakness. DIAGNOSIS  A recurrent migraine headache is often diagnosed based on:  Symptoms.  Physical examination.  A CT scan or MRI of your head. These imaging tests cannot diagnose migraines but can help rule out other causes of headaches.  TREATMENT  Medicines may be given for pain and nausea. Medicines can also be given to help prevent recurrent migraines. HOME CARE INSTRUCTIONS  Only take over-the-counter or prescription  medicines for pain or discomfort as directed by your health care provider. The use of long-term narcotics is not recommended.  Lie down in a dark, quiet room when you have a migraine.  Keep a journal to find out what may trigger your migraine headaches. For example, write down:  What you eat and drink.  How much sleep you get.  Any change to your diet or medicines.  Limit alcohol consumption.  Quit smoking if you smoke.  Get 7-9 hours of sleep, or as recommended by your health care provider.  Limit stress.  Keep lights dim if bright lights bother you and make your migraines worse. SEEK MEDICAL CARE IF:   You do not get relief from the medicines given to you.  You have a recurrence of pain.  You have a fever. SEEK IMMEDIATE MEDICAL CARE IF:  Your migraine becomes severe.  You have a stiff neck.  You have loss of vision.  You have muscular weakness or loss of muscle control.  You start losing your balance or have trouble walking.  You feel faint or pass out.  You have severe symptoms that are different from your first symptoms. MAKE SURE YOU:   Understand these instructions.  Will watch your condition.  Will get help right away if you are not doing well or get worse.   This information is not intended to replace advice given to you by your health care provider. Make sure you discuss any questions you have with your health care provider.   Document Released: 08/02/2001 Document Revised: 11/28/2014 Document Reviewed: 07/15/2013 Elsevier Interactive Patient Education 2016 Elsevier Inc.  

## 2015-08-29 NOTE — ED Notes (Signed)
Pt c/o headache x 1 week unrelieved with Excedrin. Symptoms associated with NV and photophobia

## 2015-09-06 ENCOUNTER — Encounter (HOSPITAL_COMMUNITY): Payer: Self-pay | Admitting: Emergency Medicine

## 2015-09-06 ENCOUNTER — Emergency Department (HOSPITAL_COMMUNITY)
Admission: EM | Admit: 2015-09-06 | Discharge: 2015-09-06 | Disposition: A | Discharge status: Home / Self Care | Payer: Medicaid Other | Attending: Emergency Medicine | Admitting: Emergency Medicine

## 2015-09-06 ENCOUNTER — Emergency Department (HOSPITAL_COMMUNITY): Payer: Medicaid Other

## 2015-09-06 DIAGNOSIS — Z720 Tobacco use: Secondary | ICD-10-CM | POA: Diagnosis not present

## 2015-09-06 DIAGNOSIS — Z862 Personal history of diseases of the blood and blood-forming organs and certain disorders involving the immune mechanism: Secondary | ICD-10-CM | POA: Insufficient documentation

## 2015-09-06 DIAGNOSIS — Z79899 Other long term (current) drug therapy: Secondary | ICD-10-CM | POA: Insufficient documentation

## 2015-09-06 DIAGNOSIS — Z8679 Personal history of other diseases of the circulatory system: Secondary | ICD-10-CM | POA: Insufficient documentation

## 2015-09-06 DIAGNOSIS — J069 Acute upper respiratory infection, unspecified: Secondary | ICD-10-CM | POA: Diagnosis not present

## 2015-09-06 DIAGNOSIS — R05 Cough: Secondary | ICD-10-CM | POA: Diagnosis present

## 2015-09-06 MED ORDER — HYDROCODONE-HOMATROPINE 5-1.5 MG/5ML PO SYRP: 5.0000 mL | ORAL_SOLUTION | Freq: Four times a day (QID) | ORAL | Status: DC | PRN

## 2015-09-06 MED ORDER — AZITHROMYCIN 250 MG PO TABS: ORAL_TABLET | ORAL | Status: DC

## 2015-09-06 NOTE — ED Notes (Signed)
C/o sinus pressure, productive cough with yellowish-green phlegm, and nasal congestion x 1 week.

## 2015-09-06 NOTE — Discharge Instructions (Signed)
You appear to have an upper respiratory infection (URI). An upper respiratory tract infection, or cold, is a viral infection of the air passages leading to the lungs. It is contagious and can be spread to others, especially during the first 3 or 4 days. It cannot be cured by antibiotics or other medicines. RETURN IMMEDIATELY IF you develop shortness of breath, confusion or altered mental status, a new rash, become dizzy, faint, or poorly responsive, or are unable to be cared for at home.   Please keep the antibiotic. You may begin to use it if your symptoms do not resolve after 14 days from the onset.   Upper Respiratory Infection, Adult Most upper respiratory infections (URIs) are a viral infection of the air passages leading to the lungs. A URI affects the nose, throat, and upper air passages. The most common type of URI is nasopharyngitis and is typically referred to as "the common cold." URIs run their course and usually go away on their own. Most of the time, a URI does not require medical attention, but sometimes a bacterial infection in the upper airways can follow a viral infection. This is called a secondary infection. Sinus and middle ear infections are common types of secondary upper respiratory infections. Bacterial pneumonia can also complicate a URI. A URI can worsen asthma and chronic obstructive pulmonary disease (COPD). Sometimes, these complications can require emergency medical care and may be life threatening.  CAUSES Almost all URIs are caused by viruses. A virus is a type of germ and can spread from one person to another.  RISKS FACTORS You may be at risk for a URI if:   You smoke.   You have chronic heart or lung disease.  You have a weakened defense (immune) system.   You are very young or very old.   You have nasal allergies or asthma.  You work in crowded or poorly ventilated areas.  You work in health care facilities or schools. SIGNS AND SYMPTOMS  Symptoms  typically develop 2-3 days after you come in contact with a cold virus. Most viral URIs last 7-10 days. However, viral URIs from the influenza virus (flu virus) can last 14-18 days and are typically more severe. Symptoms may include:   Runny or stuffy (congested) nose.   Sneezing.   Cough.   Sore throat.   Headache.   Fatigue.   Fever.   Loss of appetite.   Pain in your forehead, behind your eyes, and over your cheekbones (sinus pain).  Muscle aches.  DIAGNOSIS  Your health care provider may diagnose a URI by:  Physical exam.  Tests to check that your symptoms are not due to another condition such as:  Strep throat.  Sinusitis.  Pneumonia.  Asthma. TREATMENT  A URI goes away on its own with time. It cannot be cured with medicines, but medicines may be prescribed or recommended to relieve symptoms. Medicines may help:  Reduce your fever.  Reduce your cough.  Relieve nasal congestion. HOME CARE INSTRUCTIONS   Take medicines only as directed by your health care provider.   Gargle warm saltwater or take cough drops to comfort your throat as directed by your health care provider.  Use a warm mist humidifier or inhale steam from a shower to increase air moisture. This may make it easier to breathe.  Drink enough fluid to keep your urine clear or pale yellow.   Eat soups and other clear broths and maintain good nutrition.   Rest as needed.  Return to work when your temperature has returned to normal or as your health care provider advises. You may need to stay home longer to avoid infecting others. You can also use a face mask and careful hand washing to prevent spread of the virus.  Increase the usage of your inhaler if you have asthma.   Do not use any tobacco products, including cigarettes, chewing tobacco, or electronic cigarettes. If you need help quitting, ask your health care provider. PREVENTION  The best way to protect yourself from  getting a cold is to practice good hygiene.   Avoid oral or hand contact with people with cold symptoms.   Wash your hands often if contact occurs.  There is no clear evidence that vitamin C, vitamin E, echinacea, or exercise reduces the chance of developing a cold. However, it is always recommended to get plenty of rest, exercise, and practice good nutrition.  SEEK MEDICAL CARE IF:   You are getting worse rather than better.   Your symptoms are not controlled by medicine.   You have chills.  You have worsening shortness of breath.  You have brown or red mucus.  You have yellow or brown nasal discharge.  You have pain in your face, especially when you bend forward.  You have a fever.  You have swollen neck glands.  You have pain while swallowing.  You have white areas in the back of your throat. SEEK IMMEDIATE MEDICAL CARE IF:   You have severe or persistent:  Headache.  Ear pain.  Sinus pain.  Chest pain.  You have chronic lung disease and any of the following:  Wheezing.  Prolonged cough.  Coughing up blood.  A change in your usual mucus.  You have a stiff neck.  You have changes in your:  Vision.  Hearing.  Thinking.  Mood. MAKE SURE YOU:   Understand these instructions.  Will watch your condition.  Will get help right away if you are not doing well or get worse.   This information is not intended to replace advice given to you by your health care provider. Make sure you discuss any questions you have with your health care provider.   Document Released: 05/03/2001 Document Revised: 03/24/2015 Document Reviewed: 02/12/2014 Elsevier Interactive Patient Education [employer]2016 [employer]

## 2015-09-06 NOTE — ED Provider Notes (Signed)
CSN: [id number]     Arrival date & time 09/06/15  1636 History  By signing my name below, I, Evon Slackerrance Branch, attest that this documentation has been prepared under the direction and in the presence of Arthor CaptainAbigail Keiton Cosma, PA-C. Electronically Signed: Evon Slackerrance Branch, ED Scribe. 09/06/2015. 6:35 PM.     Chief Complaint  Patient presents with  . Facial Pain  . Nasal Congestion  . Cough   The history is provided by the patient. No language interpreter was used.   HPI Comments: [name] is a 39 y.o. female who presents to the Emergency Department complaining of productive cough of yellow sputum onset 3 days prior. Pt states that her sputum has streaks of blood present. Pt states she has associated chills, congestion, sinus pressure and intermittent HA's. Pt states she has tried a sinus medication with slight relief. She states that her symptoms are worse when taking a deep breath that she describes as a burning sensation. Pt states she is a every day smoker but has not been able to smoke since the onset of her symptoms. 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 Pt denies fever, nausea vomiting or other related symptoms.   Past Medical History  Diagnosis Date  . Migraine   . Sarcoidosis [facility](HCC)    Past Surgical History  Procedure Laterality Date  . Cesarean section    . Knee surgery    . Cesarean section     No family history on file. Social History  Substance Use Topics  . Smoking status: Current Every Day Smoker -- 0.00 packs/day    Types: Cigarettes  . Smokeless tobacco: None  . Alcohol Use: Yes   OB History    No data available      Review of Systems  Constitutional: Positive for chills. Negative for fever.  HENT: Positive for congestion and sinus pressure.   Respiratory: Positive for cough.   Gastrointestinal: Negative for nausea and vomiting.  All other systems reviewed and are negative.    Allergies  Fish allergy;  Naprosyn; and Sulfa antibiotics  Home Medications   Prior to Admission medications   Medication Sig Start Date End Date Taking? Authorizing Provider  ferrous sulfate 325 (65 FE) MG tablet Take 1 tablet (325 mg total) by mouth 2 (two) times daily with a meal. Patient not taking: Reported on 08/19/2015 09/30/14   Tora KindredMarianne L York, PA-C  ibuprofen (ADVIL,MOTRIN) 600 MG tablet Take 1 tablet (600 mg total) by mouth every 6 (six) hours as needed. 08/29/15   Derwood KaplanAnkit Nanavati, MD  levofloxacin (LEVAQUIN) 750 MG tablet Take 1 tablet (750 mg total) by mouth daily. Patient not taking: Reported on 08/19/2015 09/30/14   Stephani PoliceMarianne L York, PA-C  nicotine (EQ NICOTINE) 21 mg/24hr patch Place 1 patch (21 mg total) onto the skin daily. Absolutely do not wear the patch and smoke cigarettes. Patient not taking: Reported on 08/19/2015 09/30/14   Tora KindredMarianne L York, PA-C  ondansetron (ZOFRAN ODT) 4 MG disintegrating tablet Take 1 tablet (4 mg total) by mouth every 8 (eight) hours as needed for nausea or vomiting. Patient not taking: Reported on 08/19/2015 10/09/14   Renne CriglerJoshua Geiple, PA-C  triamcinolone cream (KENALOG) 0.1 % Apply 1 application topically 2 (two) times daily. 08/19/15   Christopher Lawyer, PA-C   BP 103/63 mmHg  Pulse 68  Temp(Src) 98 F (36.7 C) (Oral)  Resp 16  Ht 5\' 5"  (1.651 m)  Wt 163 lb 4  oz (74.05 kg)  BMI 27.17 kg/m2  SpO2 98%  LMP 08/26/2015   Physical Exam  Constitutional: She is oriented to person, place, and time. She appears well-developed and well-nourished. No distress.  HENT:  Head: Normocephalic and atraumatic.  Eyes: Conjunctivae and EOM are normal.  Neck: Neck supple. No tracheal deviation present.  Cardiovascular: Normal rate.   Pulmonary/Chest: Effort normal. No respiratory distress.  Musculoskeletal: Normal range of motion.  Neurological: She is alert and oriented to person, place, and time.  Skin: Skin is warm and dry.  Psychiatric: She has a normal mood and affect. Her  behavior is normal.  Nursing note and vitals reviewed.   ED Course  Procedures (including critical care time) DIAGNOSTIC STUDIES: Oxygen Saturation is 98% on RA, normal by my interpretation.    COORDINATION OF CARE: 5:18 PM-Discussed treatment plan with pt at bedside and pt agreed to plan.     Labs Review Labs Reviewed - No data to display  Imaging Review Dg Chest 2 View  09/06/2015  CLINICAL DATA:  Productive cough for 7 days with body aches, hot flashes and chills. Right upper chest pain. EXAM: CHEST  2 VIEW COMPARISON:  CT chest 09/29/2014 and PA and lateral chest 10/09/2014. FINDINGS: The lungs are clear. Heart size is normal. No pneumothorax or pleural effusion. IMPRESSION: No active cardiopulmonary disease. Electronically Signed   By: Drusilla Kanner M.D.   On: 09/06/2015 18:32      EKG Interpretation None      MDM   Final diagnoses:  URI (upper respiratory infection)     Pt CXR negative for acute infiltrate. Patients symptoms are consistent with URI, likely viral etiology. Discussed that antibiotics are not indicated for viral infections. Pt will be discharged with symptomatic treatment.  Verbalizes understanding and is agreeable with plan. Pt is hemodynamically stable & in NAD prior to dc.  Lucila Maine, personally performed the services described in this documentation. All medical record entries made by the scribe were at my direction and in my presence.  I have reviewed the chart and discharge instructions and agree that the record reflects my personal performance and is accurate and complete. Arthor Captain.  09/06/2015. 7:21 PM.         Arthor Captain, PA-C 09/06/15 1930  Rolan Bucco, MD 09/06/15 2300

## 2015-10-13 ENCOUNTER — Emergency Department (HOSPITAL_COMMUNITY)
Admission: EM | Admit: 2015-10-13 | Discharge: 2015-10-14 | Disposition: A | Discharge status: Home / Self Care | Payer: Medicaid Other | Attending: Emergency Medicine | Admitting: Emergency Medicine

## 2015-10-13 ENCOUNTER — Encounter (HOSPITAL_COMMUNITY): Payer: Self-pay | Admitting: Emergency Medicine

## 2015-10-13 DIAGNOSIS — Z8679 Personal history of other diseases of the circulatory system: Secondary | ICD-10-CM | POA: Insufficient documentation

## 2015-10-13 DIAGNOSIS — R110 Nausea: Secondary | ICD-10-CM | POA: Diagnosis not present

## 2015-10-13 DIAGNOSIS — R42 Dizziness and giddiness: Secondary | ICD-10-CM | POA: Diagnosis not present

## 2015-10-13 DIAGNOSIS — F17210 Nicotine dependence, cigarettes, uncomplicated: Secondary | ICD-10-CM | POA: Diagnosis not present

## 2015-10-13 DIAGNOSIS — Z862 Personal history of diseases of the blood and blood-forming organs and certain disorders involving the immune mechanism: Secondary | ICD-10-CM | POA: Diagnosis not present

## 2015-10-13 NOTE — ED Provider Notes (Signed)
CSN: [id number]     Arrival date & time 10/13/15  2213 History  By signing my name below, I, Soijett Blue, attest that this documentation has been prepared under the direction and in the presence of Earley Favor, NP Electronically Signed: Soijett Blue, ED Scribe. 10/13/2015. 12:06 AM.   Chief Complaint  Patient presents with  . Dizziness     The history is provided by the patient. No language interpreter was used.    [name] is a 39 y.o. female with a medical hx of vertigo who presents to the Emergency Department via EMS complaining of dizziness onset 3 hours. 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 She reports that she needed help with ambulation due to the dizziness. She states that she felt fine prior to the dizziness.  Pt is having associated symptoms of nausea. She notes that she has not tried any medications for the relief of her symptoms. She denies weakness, gait problem, head trauma, abdominal pain, ear pain/discharge, speech difficulty and any other symptoms. Denies hx of HTN or DM. She notes that she ate today a normal meal. Denies taking any daily medications. She notes that she smokes cigarettes.    Past Medical History  Diagnosis Date  . Migraine   . Sarcoidosis [facility])    Past Surgical History  Procedure Laterality Date  . Cesarean section    . Knee surgery    . Cesarean section     History reviewed. No pertinent family history. Social History  Substance Use Topics  . Smoking status: Current Every Day Smoker -- 0.00 packs/day    Types: Cigarettes  . Smokeless tobacco: None  . Alcohol Use: Yes   OB History    No data available     Review of Systems  Eyes: Negative for visual disturbance.  Gastrointestinal: Positive for nausea. Negative for vomiting and abdominal pain.  Musculoskeletal: Negative for gait problem.  Neurological: Positive for dizziness. Negative for speech difficulty.       Allergies  Fish allergy; Naprosyn; and Sulfa antibiotics  Home Medications   Prior to Admission medications   Medication Sig Start Date End Date Taking? Authorizing Provider  azithromycin (ZITHROMAX Z-PAK) 250 MG tablet 2 po day one, then 1 daily x 4 days Patient not taking: Reported on 10/13/2015 09/06/15   Arthor Captain, PA-C  ferrous sulfate 325 (65 FE) MG tablet Take 1 tablet (325 mg total) by mouth 2 (two) times daily with a meal. Patient not taking: Reported on 08/19/2015 09/30/14   Tora Kindred York, PA-C  HYDROcodone-homatropine (HYCODAN) 5-1.5 MG/5ML syrup Take 5 mLs by mouth every 6 (six) hours as needed for cough. Patient not taking: Reported on 10/13/2015 09/06/15   Arthor Captain, PA-C  ibuprofen (ADVIL,MOTRIN) 600 MG tablet Take 1 tablet (600 mg total) by mouth every 6 (six) hours as needed. Patient not taking: Reported on 10/13/2015 08/29/15   Derwood Kaplan, MD  levofloxacin (LEVAQUIN) 750 MG tablet Take 1 tablet (750 mg total) by mouth daily. Patient not taking: Reported on 08/19/2015 09/30/14   Stephani Police, PA-C  meclizine (ANTIVERT) 25 MG tablet Take 1 tablet (25 mg total) by mouth 3 (three) times daily as needed for dizziness. 10/14/15   Earley Favor, NP  nicotine (EQ NICOTINE) 21 mg/24hr patch Place 1 patch (21 mg total) onto the skin daily. Absolutely do not wear the patch and smoke cigarettes. Patient not  taking: Reported on 08/19/2015 09/30/14   Tora KindredMarianne L York, PA-C  ondansetron (ZOFRAN ODT) 4 MG disintegrating tablet Take 1 tablet (4 mg total) by mouth every 8 (eight) hours as needed for nausea or vomiting. Patient not taking: Reported on 08/19/2015 10/09/14   Renne CriglerJoshua Geiple, PA-C  triamcinolone cream (KENALOG) 0.1 % Apply 1 application topically 2 (two) times daily. Patient not taking: Reported on 10/13/2015 08/19/15   Charlestine Nighthristopher Lawyer, PA-C   BP 103/61 mmHg  Pulse 72  Temp(Src) 98.2 F (36.8 C) (Oral)  Resp 14  SpO2 100%  LMP 08/26/2015 Physical Exam   Constitutional: She is oriented to person, place, and time. She appears well-developed and well-nourished. No distress.  HENT:  Head: Normocephalic and atraumatic.  Eyes: EOM are normal.  Neck: Neck supple.  Cardiovascular: Normal rate, regular rhythm and normal heart sounds.  Exam reveals no gallop and no friction rub.   No murmur heard. Pulmonary/Chest: Effort normal and breath sounds normal. No respiratory distress. She has no wheezes. She has no rales.  Abdominal: Soft. There is no tenderness.  Musculoskeletal: Normal range of motion.  Neurological: She is alert and oriented to person, place, and time.  No increased vertigo with rapid head movement.   Skin: Skin is warm and dry.  Psychiatric: She has a normal mood and affect. Her behavior is normal.  Nursing note and vitals reviewed.   ED Course  Procedures (including critical care time) DIAGNOSTIC STUDIES: Oxygen Saturation is 100% on RA, nl by my interpretation.    COORDINATION OF CARE: 11:59 PM Discussed treatment plan with pt at bedside which includes CBC, meclizine, and CT head without contrast and pt agreed to plan.    Labs Review Labs Reviewed  URINALYSIS, ROUTINE W REFLEX MICROSCOPIC ([facility]) - Abnormal; Notable for the following:    Specific Gravity, Urine 1.004 (*)    Hgb urine dipstick TRACE (*)    All other components within normal limits  CBC WITH DIFFERENTIAL/PLATELET - Abnormal; Notable for the following:    WBC 3.4 (*)    All other components within normal limits  URINE MICROSCOPIC-ADD ON - Abnormal; Notable for the following:    Squamous Epithelial / LPF 0-5 (*)    All other components within normal limits  I-STAT CHEM 8, ED - Abnormal; Notable for the following:    Potassium 3.3 (*)    BUN <3 (*)    All other components within normal limits    Imaging Review Ct Head Wo Contrast  10/14/2015  CLINICAL DATA:  Acute onset of dizziness. Difficulty walking. Initial encounter. EXAM: CT HEAD  WITHOUT CONTRAST TECHNIQUE: Contiguous axial images were obtained from the base of the skull through the vertex without intravenous contrast. COMPARISON:  CT of the head performed 05/18/2008 FINDINGS: There is no evidence of acute infarction, mass lesion, or intra- or extra-axial hemorrhage on CT. The posterior fossa, including the cerebellum, brainstem and fourth ventricle, is within normal limits. The third and lateral ventricles, and basal ganglia are unremarkable in appearance. The cerebral hemispheres are symmetric in appearance, with normal gray-white differentiation. No mass effect or midline shift is seen. There is no evidence of fracture; visualized osseous structures are unremarkable in appearance. The orbits are within normal limits. The paranasal sinuses and mastoid air cells are well-aerated. No significant soft tissue abnormalities are seen. IMPRESSION: Unremarkable noncontrast CT of the head. Electronically Signed   By: Roanna RaiderJeffery  Chang M.D.   On: 10/14/2015 03:09   I have personally reviewed and  evaluated these images and lab results as part of my medical decision-making.   EKG Interpretation None     CTScan normal better after PO Meclizine  MDM   Final diagnoses:  Dizzy    I personally performed the services described in this documentation, which was scribed in my presence. The recorded information has been reviewed and is accurate.   Earley Favor, NP 10/14/15 1610  Cy Blamer, MD 10/14/15 [phone]

## 2015-10-13 NOTE — ED Notes (Signed)
Per EMS: pt c/o dizziness, hx of vertigo. Pt also c/o nausea.

## 2015-10-13 NOTE — ED Notes (Signed)
Bed: [id number] Expected date:  Expected time:  Means of arrival:  Comments: EMS dizziness

## 2015-10-14 ENCOUNTER — Emergency Department (HOSPITAL_COMMUNITY): Payer: Medicaid Other

## 2015-10-14 LAB — CBC WITH DIFFERENTIAL/PLATELET
BASOS PCT: 0 %
Basophils Absolute: 0 10*3/uL (ref 0.0–0.1)
EOS ABS: 0 10*3/uL (ref 0.0–0.7)
Eosinophils Relative: 0 %
HCT: 37.3 % (ref 36.0–46.0)
HEMOGLOBIN: 12.1 g/dL (ref 12.0–15.0)
Lymphocytes Relative: 37 %
Lymphs Abs: 1.3 10*3/uL (ref 0.7–4.0)
MCH: 28.6 pg (ref 26.0–34.0)
MCHC: 32.4 g/dL (ref 30.0–36.0)
MCV: 88.2 fL (ref 78.0–100.0)
Monocytes Absolute: 0.3 10*3/uL (ref 0.1–1.0)
Monocytes Relative: 9 %
NEUTROS ABS: 1.8 10*3/uL (ref 1.7–7.7)
NEUTROS PCT: 54 %
Platelets: 236 10*3/uL (ref 150–400)
RBC: 4.23 MIL/uL (ref 3.87–5.11)
RDW: 12.7 % (ref 11.5–15.5)
WBC: 3.4 10*3/uL — AB (ref 4.0–10.5)

## 2015-10-14 LAB — URINALYSIS, ROUTINE W REFLEX MICROSCOPIC
BILIRUBIN URINE: NEGATIVE
Glucose, UA: NEGATIVE mg/dL
KETONES UR: NEGATIVE mg/dL
LEUKOCYTES UA: NEGATIVE
NITRITE: NEGATIVE
Protein, ur: NEGATIVE mg/dL
SPECIFIC GRAVITY, URINE: 1.004 — AB (ref 1.005–1.030)
pH: 6 (ref 5.0–8.0)

## 2015-10-14 LAB — I-STAT CHEM 8, ED
CHLORIDE: 101 mmol/L (ref 101–111)
Calcium, Ion: 1.17 mmol/L (ref 1.12–1.23)
Creatinine, Ser: 0.6 mg/dL (ref 0.44–1.00)
Glucose, Bld: 94 mg/dL (ref 65–99)
HEMATOCRIT: 41 % (ref 36.0–46.0)
Hemoglobin: 13.9 g/dL (ref 12.0–15.0)
POTASSIUM: 3.3 mmol/L — AB (ref 3.5–5.1)
SODIUM: 140 mmol/L (ref 135–145)
TCO2: 25 mmol/L (ref 0–100)

## 2015-10-14 LAB — URINE MICROSCOPIC-ADD ON
BACTERIA UA: NONE SEEN
WBC UA: NONE SEEN WBC/hpf (ref 0–5)

## 2015-10-14 MED ORDER — MECLIZINE HCL 25 MG PO TABS
25.0000 mg | ORAL_TABLET | Freq: Once | ORAL | Status: AC
  Administered 2015-10-14: 25 mg via ORAL
  Filled 2015-10-14: qty 1

## 2015-10-14 MED ORDER — MECLIZINE HCL 25 MG PO TABS: 25.0000 mg | ORAL_TABLET | Freq: Three times a day (TID) | ORAL | Status: DC | PRN

## 2015-10-14 NOTE — ED Notes (Signed)
Patient was seen ambulating in hallway to and from restroom without difficulty.

## 2015-10-14 NOTE — Discharge Instructions (Signed)
Dizziness Dizziness is a common problem. It makes you feel unsteady or lightheaded. You may feel like you are about to pass out (faint). Dizziness can lead to injury if you stumble or fall. Anyone can get dizzy, but dizziness is more common in older adults. This condition can be caused by a number of things, including:  Medicines.  Dehydration.  Illness. HOME CARE Following these instructions may help with your condition: Eating and Drinking  Drink enough fluid to keep your pee (urine) clear or pale yellow. This helps to keep you from getting dehydrated. Try to drink more clear fluids, such as water.  Do not drink alcohol.  Limit how much caffeine you drink or eat if told by your doctor.  Limit how much salt you drink or eat if told by your doctor. Activity  Avoid making quick movements.  When you stand up from sitting in a chair, steady yourself until you feel okay.  In the morning, first sit up on the side of the bed. When you feel okay, stand slowly while you hold onto something. Do this until you know that your balance is fine.  Move your legs often if you need to stand in one place for a long time. Tighten and relax your muscles in your legs while you are standing.  Do not drive or use heavy machinery if you feel dizzy.  Avoid bending down if you feel dizzy. Place items in your home so that they are easy for you to reach without leaning over. Lifestyle  Do not use any tobacco products, including cigarettes, chewing tobacco, or electronic cigarettes. If you need help quitting, ask your doctor.  Try to lower your stress level, such as with yoga or meditation. Talk with your doctor if you need help. General Instructions  Watch your dizziness for any changes.  Take medicines only as told by your doctor. Talk with your doctor if you think that your dizziness is caused by a medicine that you are taking.  Tell a friend or a family member that you are feeling dizzy. If he or  she notices any changes in your behavior, have this person call your doctor.  Keep all follow-up visits as told by your doctor. This is important. GET HELP IF:  Your dizziness does not go away.  Your dizziness or light-headedness gets worse.  You feel sick to your stomach (nauseous).  You have trouble hearing.  You have new symptoms.  You are unsteady on your feet or you feel like the room is spinning. GET HELP RIGHT AWAY IF:  You throw up (vomit) or have diarrhea and are unable to eat or drink anything.  You have trouble:  Talking.  Walking.  Swallowing.  Using your arms, hands, or legs.  You feel generally weak.  You are not thinking clearly or you have trouble forming sentences. It may take a friend or family member to notice this.  You have:  Chest pain.  Pain in your belly (abdomen).  Shortness of breath.  Sweating.  Your vision changes.  You are bleeding.  You have a headache.  You have neck pain or a stiff neck.  You have a fever.   This information is not intended to replace advice given to you by your health care provider. Make sure you discuss any questions you have with your health care provider.   Document Released: 10/27/2011 Document Revised: 03/24/2015 Document Reviewed: 11/03/2014 Elsevier Interactive Patient Education 2016 [employer]. Tonight your head Ct Scan  is normal You were given a medication called Meclizine that did help you symptoms  Please make an appointment with your PCP for further evaluation

## 2015-10-14 NOTE — ED Notes (Signed)
Patient given coke per patient request

## 2015-10-15 ENCOUNTER — Emergency Department (HOSPITAL_COMMUNITY)
Admission: EM | Admit: 2015-10-15 | Discharge: 2015-10-15 | Disposition: A | Discharge status: Home / Self Care | Payer: Medicaid Other | Attending: Emergency Medicine | Admitting: Emergency Medicine

## 2015-10-15 ENCOUNTER — Encounter (HOSPITAL_COMMUNITY): Payer: Self-pay | Admitting: Emergency Medicine

## 2015-10-15 DIAGNOSIS — M25571 Pain in right ankle and joints of right foot: Secondary | ICD-10-CM

## 2015-10-15 DIAGNOSIS — F17210 Nicotine dependence, cigarettes, uncomplicated: Secondary | ICD-10-CM | POA: Insufficient documentation

## 2015-10-15 DIAGNOSIS — Z862 Personal history of diseases of the blood and blood-forming organs and certain disorders involving the immune mechanism: Secondary | ICD-10-CM | POA: Insufficient documentation

## 2015-10-15 DIAGNOSIS — Z8679 Personal history of other diseases of the circulatory system: Secondary | ICD-10-CM | POA: Diagnosis not present

## 2015-10-15 DIAGNOSIS — M79674 Pain in right toe(s): Secondary | ICD-10-CM | POA: Diagnosis present

## 2015-10-15 HISTORY — DX: Dizziness and giddiness: R42

## 2015-10-15 MED ORDER — HYDROCODONE-ACETAMINOPHEN 5-325 MG PO TABS: 1.0000 | ORAL_TABLET | Freq: Four times a day (QID) | ORAL | Status: DC | PRN

## 2015-10-15 MED ORDER — INDOMETHACIN 25 MG PO CAPS
50.0000 mg | ORAL_CAPSULE | Freq: Once | ORAL | Status: AC
  Administered 2015-10-15: 50 mg via ORAL
  Filled 2015-10-15: qty 2

## 2015-10-15 MED ORDER — INDOMETHACIN 50 MG PO CAPS: 50.0000 mg | ORAL_CAPSULE | Freq: Three times a day (TID) | ORAL | Status: DC | PRN

## 2015-10-15 MED ORDER — HYDROCODONE-ACETAMINOPHEN 5-325 MG PO TABS
2.0000 | ORAL_TABLET | Freq: Once | ORAL | Status: AC
  Administered 2015-10-15: 2 via ORAL
  Filled 2015-10-15: qty 2

## 2015-10-15 NOTE — Discharge Instructions (Signed)
Joint Pain °Joint pain, which is also called arthralgia, can be caused by many things. Joint pain often goes away when you follow your health care provider's instructions for relieving pain at home. However, joint pain can also be caused by conditions that require further treatment. Common causes of joint pain include: °· Bruising in the area of the joint. °· Overuse of the joint. °· Wear and tear on the joints that occur with aging (osteoarthritis). °· Various other forms of arthritis. °· A buildup of a crystal form of uric acid in the joint (gout). °· Infections of the joint (septic arthritis) or of the bone (osteomyelitis). °Your health care provider may recommend medicine to help with the pain. If your joint pain continues, additional tests may be needed to diagnose your condition. °HOME CARE INSTRUCTIONS °Watch your condition for any changes. Follow these instructions as directed to lessen the pain that you are feeling. °· Take medicines only as directed by your health care provider. °· Rest the affected area for as long as your health care provider says that you should. If directed to do so, raise the painful joint above the level of your heart while you are sitting or lying down. °· Do not do things that cause or worsen pain. °· If directed, apply ice to the painful area: °· Put ice in a plastic bag. °· Place a towel between your skin and the bag. °· Leave the ice on for 20 minutes, 2-3 times per day. °· Wear an elastic bandage, splint, or sling as directed by your health care provider. Loosen the elastic bandage or splint if your fingers or toes become numb and tingle, or if they turn cold and blue. °· Begin exercising or stretching the affected area as directed by your health care provider. Ask your health care provider what types of exercise are safe for you. °· Keep all follow-up visits as directed by your health care provider. This is important. °SEEK MEDICAL CARE IF: °· Your pain increases, and medicine  does not help. °· Your joint pain does not improve within 3 days. °· You have increased bruising or swelling. °· You have a fever. °· You lose 10 lb (4.5 kg) or more without trying. °SEEK IMMEDIATE MEDICAL CARE IF: °· You are not able to move the joint. °· Your fingers or toes become numb or they turn cold and blue. °  °This information is not intended to replace advice given to you by your health care provider. Make sure you discuss any questions you have with your health care provider. °  °Document Released: 11/07/2005 Document Revised: 11/28/2014 Document Reviewed: 08/19/2014 °Elsevier Interactive Patient Education ©2016 [employer] °Gout °Gout is an inflammatory arthritis caused by a buildup of uric acid crystals in the joints. Uric acid is a chemical that is normally present in the blood. When the level of uric acid in the blood is too high it can form crystals that deposit in your joints and tissues. This causes joint redness, soreness, and swelling (inflammation). Repeat attacks are common. Over time, uric acid crystals can form into masses (tophi) near a joint, destroying bone and causing disfigurement. Gout is treatable and often preventable. °CAUSES  °The disease begins with elevated levels of uric acid in the blood. Uric acid is produced by your body when it breaks down a naturally found substance called purines. Certain foods you eat, such as meats and fish, contain high amounts of purines. Causes of an elevated uric acid level include: °·   Being passed down from parent to child (heredity). °· Diseases that cause increased uric acid production (such as obesity, psoriasis, and certain cancers). °· Excessive alcohol use. °· Diet, especially diets rich in meat and seafood. °· Medicines, including certain cancer-fighting medicines (chemotherapy), water pills (diuretics), and aspirin. °· Chronic kidney disease. The kidneys are no longer able to remove uric acid well. °· Problems with metabolism. °Conditions  strongly associated with gout include: °· Obesity. °· High blood pressure. °· High cholesterol. °· Diabetes. °Not everyone with elevated uric acid levels gets gout. It is not understood why some people get gout and others do not. Surgery, joint injury, and eating too much of certain foods are some of the factors that can lead to gout attacks. °SYMPTOMS  °· An attack of gout comes on quickly. It causes intense pain with redness, swelling, and warmth in a joint. °· Fever can occur. °· Often, only one joint is involved. Certain joints are more commonly involved: °¨ Base of the big toe. °¨ Knee. °¨ Ankle. °¨ Wrist. °¨ Finger. °Without treatment, an attack usually goes away in a few days to weeks. Between attacks, you usually will not have symptoms, which is different from many other forms of arthritis. °DIAGNOSIS  °Your caregiver will suspect gout based on your symptoms and exam. In some cases, tests may be recommended. The tests may include: °· Blood tests. °· Urine tests. °· X-rays. °· Joint fluid exam. This exam requires a needle to remove fluid from the joint (arthrocentesis). Using a microscope, gout is confirmed when uric acid crystals are seen in the joint fluid. °TREATMENT  °There are two phases to gout treatment: treating the sudden onset (acute) attack and preventing attacks (prophylaxis). °· Treatment of an Acute Attack. °¨ Medicines are used. These include anti-inflammatory medicines or steroid medicines. °¨ An injection of steroid medicine into the affected joint is sometimes necessary. °¨ The painful joint is rested. Movement can worsen the arthritis. °¨ You may use warm or cold treatments on painful joints, depending which works best for you. °· Treatment to Prevent Attacks. °¨ If you suffer from frequent gout attacks, your caregiver may advise preventive medicine. These medicines are started after the acute attack subsides. These medicines either help your kidneys eliminate uric acid from your body or  decrease your uric acid production. You may need to stay on these medicines for a very long time. °¨ The early phase of treatment with preventive medicine can be associated with an increase in acute gout attacks. For this reason, during the first few months of treatment, your caregiver may also advise you to take medicines usually used for acute gout treatment. Be sure you understand your caregiver's directions. Your caregiver may make several adjustments to your medicine dose before these medicines are effective. °¨ Discuss dietary treatment with your caregiver or dietitian. Alcohol and drinks high in sugar and fructose and foods such as meat, poultry, and seafood can increase uric acid levels. Your caregiver or dietitian can advise you on drinks and foods that should be limited. °HOME CARE INSTRUCTIONS  °· Do not take aspirin to relieve pain. This raises uric acid levels. °· Only take over-the-counter or prescription medicines for pain, discomfort, or fever as directed by your caregiver. °· Rest the joint as much as possible. When in bed, keep sheets and blankets off painful areas. °· Keep the affected joint raised (elevated). °· Apply warm or cold treatments to painful joints. Use of warm or cold treatments depends on which   works best for you. °· Use crutches if the painful joint is in your leg. °· Drink enough fluids to keep your urine clear or pale yellow. This helps your body get rid of uric acid. Limit alcohol, sugary drinks, and fructose drinks. °· Follow your dietary instructions. Pay careful attention to the amount of protein you eat. Your daily diet should emphasize fruits, vegetables, whole grains, and fat-free or low-fat milk products. Discuss the use of coffee, vitamin C, and cherries with your caregiver or dietitian. These may be helpful in lowering uric acid levels. °· Maintain a healthy body weight. °SEEK MEDICAL CARE IF:  °· You develop diarrhea, vomiting, or any side effects from medicines. °· You  do not feel better in 24 hours, or you are getting worse. °SEEK IMMEDIATE MEDICAL CARE IF:  °· Your joint becomes suddenly more tender, and you have chills or a fever. °MAKE SURE YOU:  °· Understand these instructions. °· Will watch your condition. °· Will get help right away if you are not doing well or get worse. °  °This information is not intended to replace advice given to you by your health care provider. Make sure you discuss any questions you have with your health care provider. °  °Document Released: 11/04/2000 Document Revised: 11/28/2014 Document Reviewed: 06/20/2012 °Elsevier Interactive Patient Education ©2016 Elsevier Inc. ° °

## 2015-10-15 NOTE — ED Notes (Signed)
Pt reports "labor pains in her toe" L great toe. States started this morning when she woke up. Denies injury. Did not try an medication pta. Cap refill/color/mobility in toe good, just painful

## 2015-10-15 NOTE — ED Notes (Signed)
Patient left at this time with all belongings. 

## 2015-10-15 NOTE — ED Provider Notes (Signed)
CSN: [id number]     Arrival date & time 10/15/15  2043 History  By signing my name below, I, Jarvis Morganaylor Ferguson, attest that this documentation has been prepared under the direction and in the presence of Danelle BerryLeisa Hanson Medeiros, PA-C Electronically Signed: Jarvis Morganaylor Ferguson, ED Scribe. 10/16/2015. 6:12 AM.    Chief Complaint  Patient presents with  . Toe Pain    The history is provided by the patient. No language interpreter was used.    HPI Comments: [name] is a 39 y.o. female who presents to the Emergency Department complaining of sudden onset, gradually worsening, constant, moderate, sharp pain to her left great toe that began this morning. Pt reports she has a h/o gout in that toe, 8 years ago, and took a steroid which provided relief. She did not take any medications prior to arrival. She states she attempted to soak the foot in warm water and Epson salt with no significant relief. Pt endorses the pain is exacerbated with movement of her toe. She denies any recent trauma or injury to the foot. Pt notes she is on her feet all day for work. Pt reports she recently has been eating pork and red meat. She denies any fever or redness to the toe.  Past Medical History  Diagnosis Date  . Migraine   . Sarcoidosis (HCC)   . Vertigo    Past Surgical History  Procedure Laterality Date  . Cesarean section    . Knee surgery    . Cesarean section     No family history on file. Social History  Substance Use Topics  . Smoking status: Current Every Day Smoker -- 1.00 packs/day    Types: Cigarettes  . Smokeless tobacco: None  . Alcohol Use: Yes   OB History    No data available     Review of Systems  Musculoskeletal: Positive for arthralgias.  Skin: Negative for color change.      Allergies  Fish allergy; Naprosyn; and Sulfa antibiotics  Home Medications   Prior to Admission medications   Medication Sig Start Date End Date Taking? Authorizing Provider  azithromycin (ZITHROMAX Z-PAK)  250 MG tablet 2 po day one, then 1 daily x 4 days Patient not taking: Reported on 10/13/2015 09/06/15   Arthor CaptainAbigail Harris, PA-C  ferrous sulfate 325 (65 FE) MG tablet Take 1 tablet (325 mg total) by mouth 2 (two) times daily with a meal. Patient not taking: Reported on 08/19/2015 09/30/14   Stephani PoliceMarianne L York, PA-C  HYDROcodone-acetaminophen (NORCO/VICODIN) 5-325 MG tablet Take 1-2 tablets by mouth every 6 (six) hours as needed for severe pain. 10/15/15   Danelle BerryLeisa Rheanna Sergent, PA-C  HYDROcodone-homatropine (HYCODAN) 5-1.5 MG/5ML syrup Take 5 mLs by mouth every 6 (six) hours as needed for cough. Patient not taking: Reported on 10/13/2015 09/06/15   Arthor CaptainAbigail Harris, PA-C  ibuprofen (ADVIL,MOTRIN) 600 MG tablet Take 1 tablet (600 mg total) by mouth every 6 (six) hours as needed. Patient not taking: Reported on 10/13/2015 08/29/15   Derwood KaplanAnkit Nanavati, MD  indomethacin (INDOCIN) 50 MG capsule Take 1 capsule (50 mg total) by mouth 3 (three) times daily as needed for moderate pain. 10/15/15   Danelle BerryLeisa Hasna Stefanik, PA-C  levofloxacin (LEVAQUIN) 750 MG tablet Take 1 tablet (750 mg total) by mouth daily. Patient not taking: Reported on 08/19/2015 09/30/14   Stephani PoliceMarianne L York, PA-C  meclizine (ANTIVERT) 25 MG tablet Take 1 tablet (25 mg total) by mouth 3 (three) times daily as needed for dizziness. 10/14/15   Dondra SpryGail  Manus Rudd, NP  nicotine (EQ NICOTINE) 21 mg/24hr patch Place 1 patch (21 mg total) onto the skin daily. Absolutely do not wear the patch and smoke cigarettes. Patient not taking: Reported on 08/19/2015 09/30/14   Tora Kindred York, PA-C  ondansetron (ZOFRAN ODT) 4 MG disintegrating tablet Take 1 tablet (4 mg total) by mouth every 8 (eight) hours as needed for nausea or vomiting. Patient not taking: Reported on 08/19/2015 10/09/14   Renne Crigler, PA-C  triamcinolone cream (KENALOG) 0.1 % Apply 1 application topically 2 (two) times daily. Patient not taking: Reported on 10/13/2015 08/19/15   Charlestine Night, PA-C   Triage Vitals: BP  140/81 mmHg  Pulse 65  Temp(Src) 98.2 F (36.8 C) (Oral)  Resp 16  SpO2 100%  LMP 06/22/2015 (Approximate)  Physical Exam  Constitutional: She is oriented to person, place, and time. She appears well-developed and well-nourished. No distress.  HENT:  Head: Normocephalic and atraumatic.  Eyes: Conjunctivae and EOM are normal.  Neck: Neck supple. No tracheal deviation present.  Cardiovascular: Normal rate.   Pulmonary/Chest: Effort normal. No respiratory distress.  Musculoskeletal: Normal range of motion. She exhibits tenderness.  Minimal swelling to left great toe, no erythema, tenderness to medial MTP, normal ROM, normal strength  Neurological: She is alert and oriented to person, place, and time.  Skin: Skin is warm and dry.  Psychiatric: She has a normal mood and affect. Her behavior is normal.  Nursing note and vitals reviewed.   ED Course  Procedures (including critical care time)  DIAGNOSTIC STUDIES: Oxygen Saturation is 100% on RA, normal by my interpretation.    COORDINATION OF CARE: 9:25 PM- Will give rx for Indomethacin, Colchicine and pain meds with a post op shoes and crutches. Pt advised of plan for treatment and pt agrees.      Labs Review Labs Reviewed - No data to display  Imaging Review   EKG Interpretation None      MDM   Final diagnoses:  Toe joint pain, right   Pt with right great toe pain at the MTP, reports history of gout, but no issues for the past 8 years.  She states she ate pork today and had gradually increased pain in her toe, worse with movement, and she denies injury, trauma, overuse, redness, warmth or fever. On exam she has minimal swelling, no erythema, no warmth, tenderness to light palpation and severe pain with range of motion testing. Patient was not concerning for a septic arthritis, she had no erythema, no fever, chills or other constitutional symptoms. She was treated for gout with indomethacin and given a few pain pills.  She was advised to follow up with her PCP for recheck.  Return precautions were given for concerning signs or symptoms of infection. She verbalized understanding. She was discharged home in a postop boot and was given crutches for comfort.  She is given her first dose of pain medication and indomethacin here in the ER. Her vital signs were stable and she was discharged home in satisfactory condition  I personally performed the services described in this documentation, which was scribed in my presence. The recorded information has been reviewed and is accurate.     Danelle Berry, PA-C 10/16/15 1610  Geoffery Lyons, MD 10/19/15 [phone]

## 2016-01-31 IMAGING — US US PELVIS COMPLETE
1 series · 14 of 25 positions shown · non-contrast
Comparison: No similar prior exam is available at this institution
for comparison or on [HOSPITAL] PACS.

CLINICAL DATA: Menorrhagia

EXAM:
TRANSABDOMINAL AND TRANSVAGINAL ULTRASOUND OF PELVIS
TECHNIQUE: Both transabdominal and transvaginal ultrasound examinations of the
pelvis were performed. Transabdominal technique was performed for
global imaging of the pelvis including uterus, ovaries, adnexal
regions, and pelvic cul-de-sac. It was necessary to proceed with
endovaginal exam following the transabdominal exam to visualize the
endometrium and ovaries.

[Series 1: us pelvis complete · 0.22mm/px · 14 of 41 slices shown]
[im 1/41]
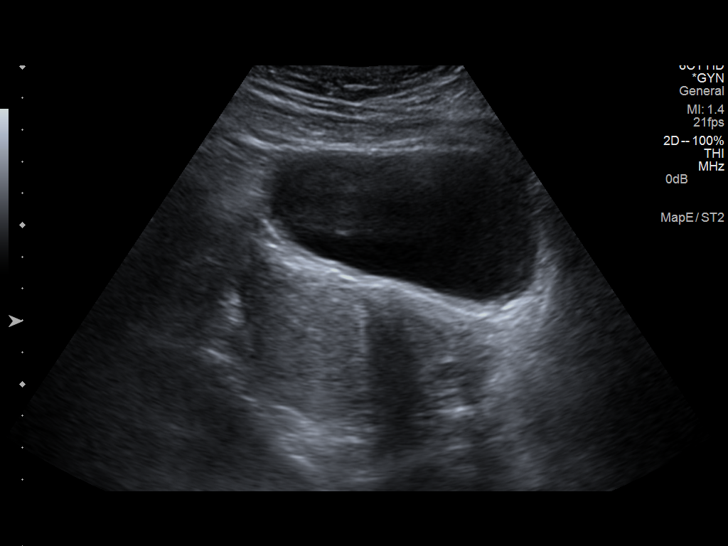
[im 4/41]
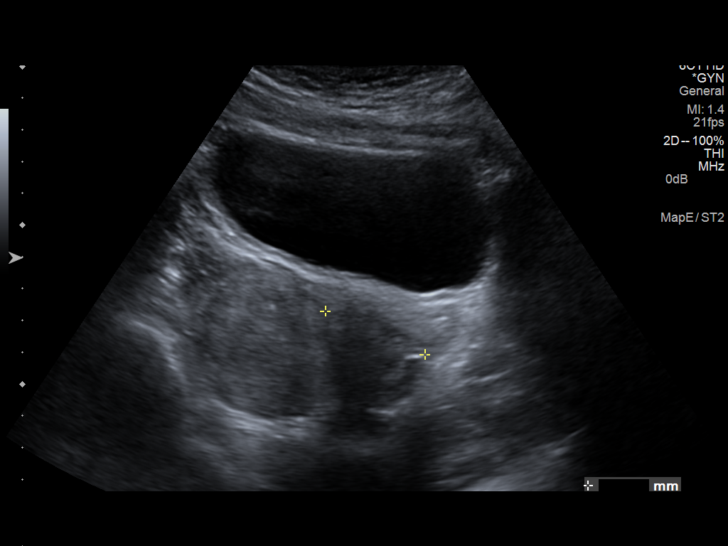
[im 7/41]
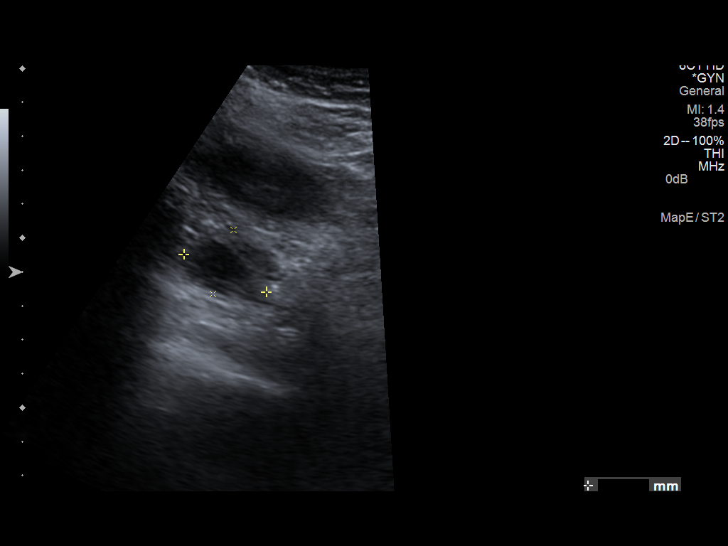
[im 11/41]
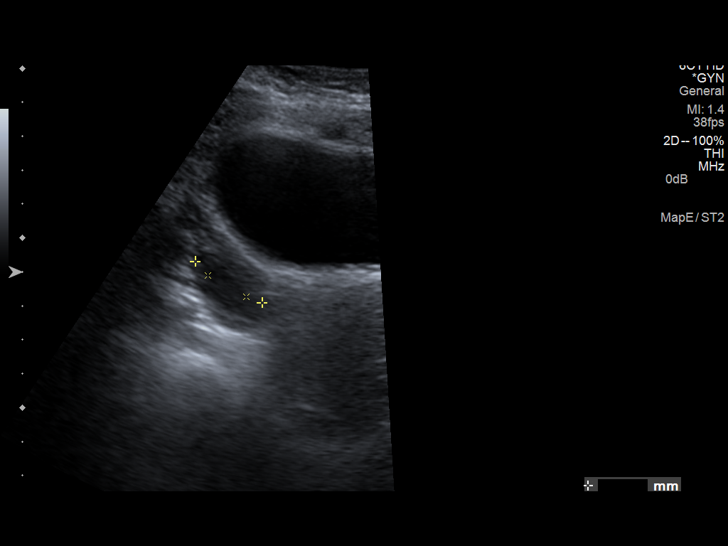
[im 14/41]
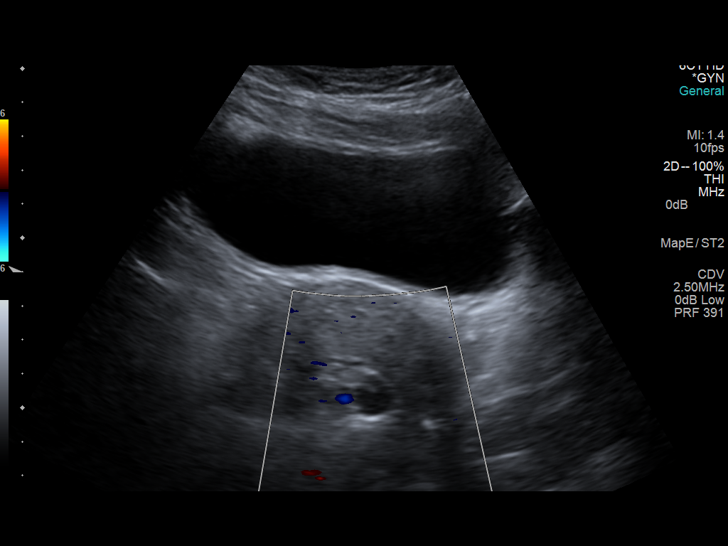
[im 16/41]
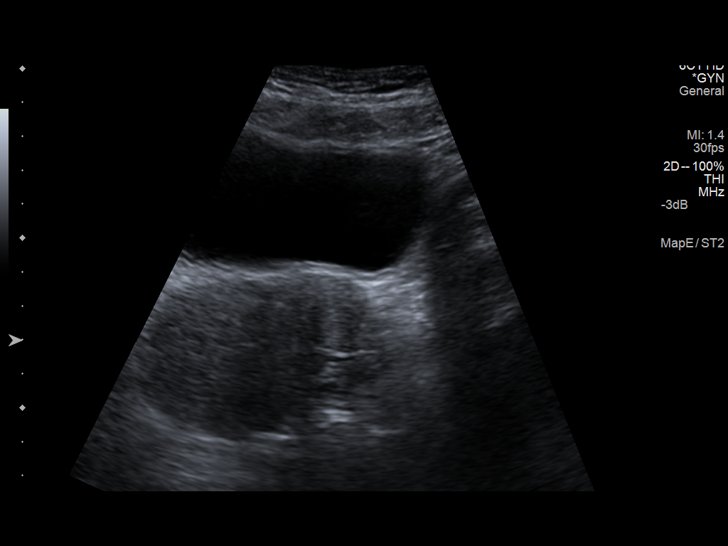
[im 19/41]
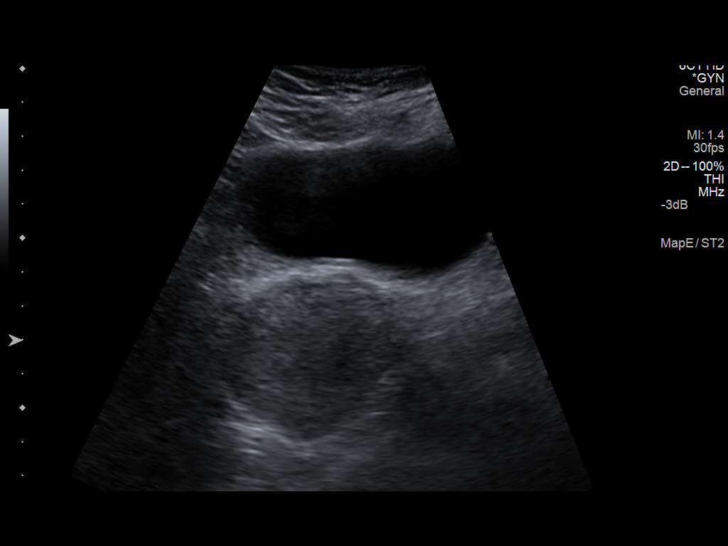
[im 22/41]
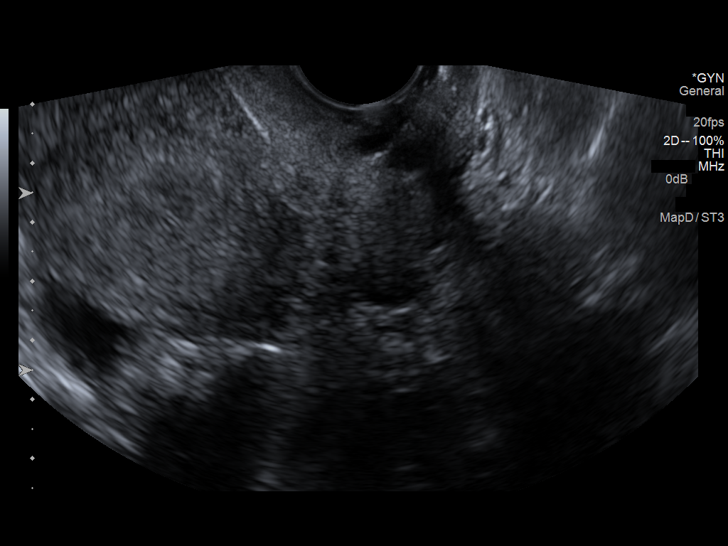
[im 26/41]
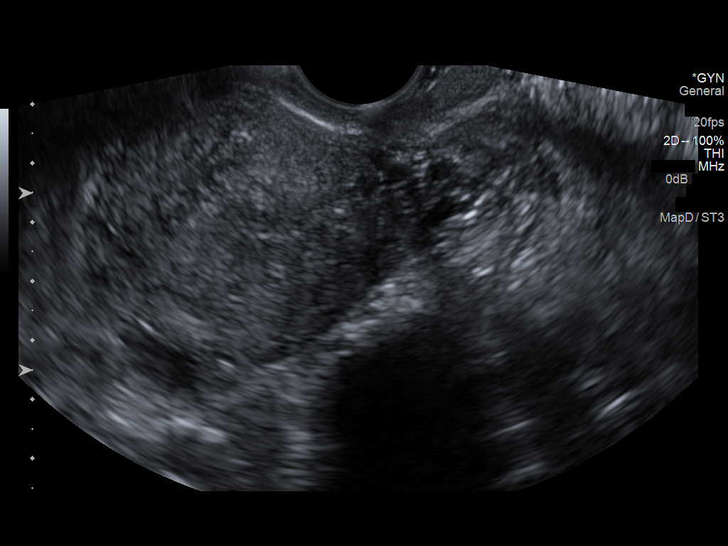
[im 27/41]
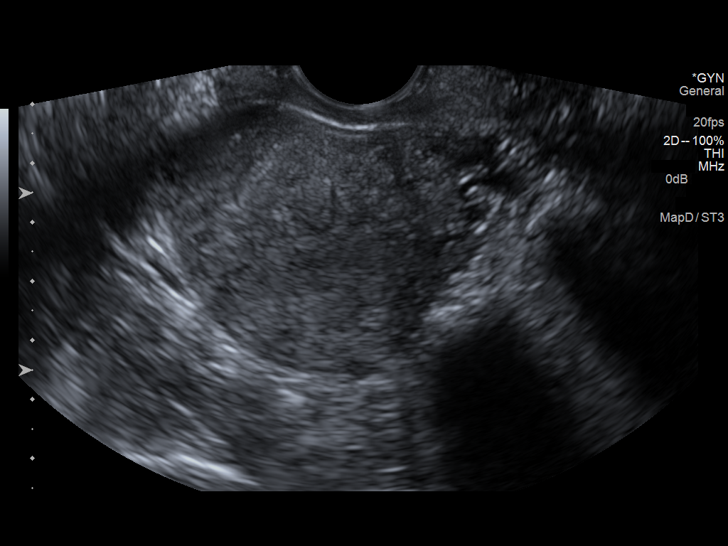
[im 31/41]
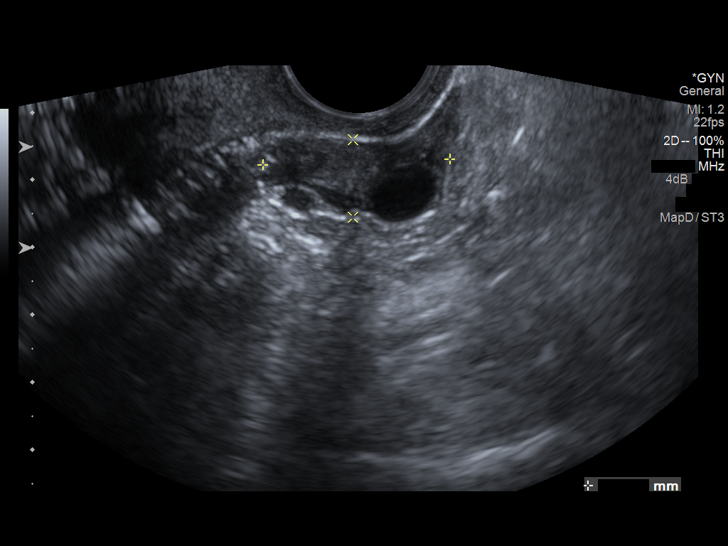
[im 34/41]
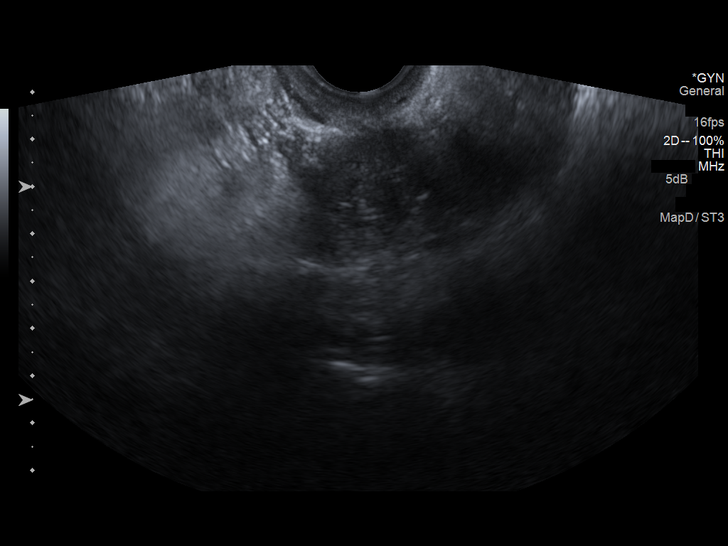
[im 37/41]
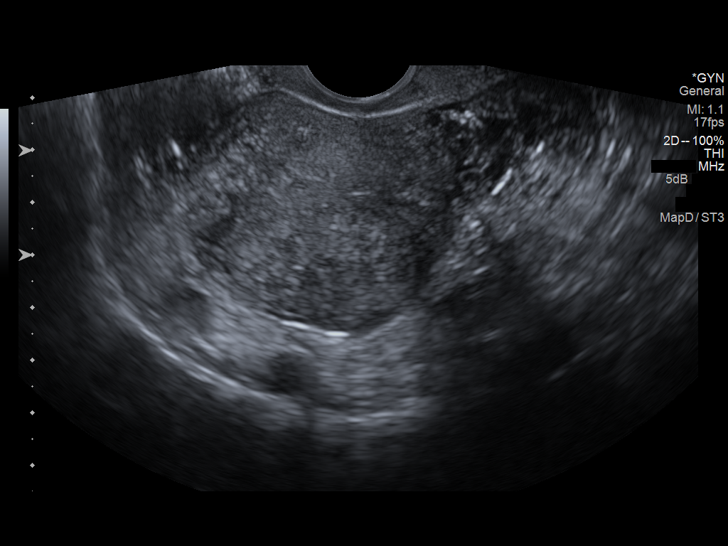
[im 41/41]
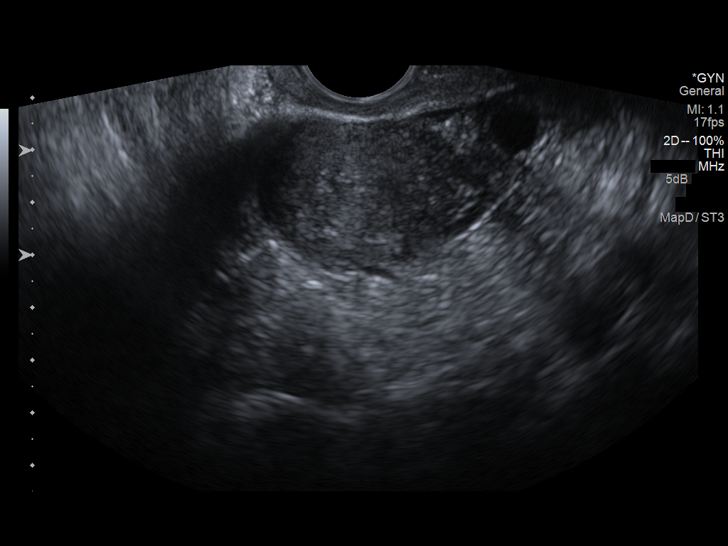

[14 of 25 positions shown; findings below may reference images not displayed]

FINDINGS: Uterus

Measurements: 7.7 x 5.0 x 4.7 cm. No fibroids or other mass
visualized.

Endometrium

Thickness: 3 mm, uniformly echogenic. No focal abnormality
visualized.

Right ovary

Measurements: 2.7 x 2.3 x 2.0 cm, seen transabdominally only without
focal abnormality. Normal appearance/no adnexal mass.

Left ovary

Measurements: 2.8 x 2.2 x 1.2 cm. Normal appearance/no adnexal mass.

Other findings

Trace free fluid in the cul de sac
IMPRESSION: Normal exam. No specific abnormality identified to explain the
history of menorrhagia

## 2016-01-31 IMAGING — CT CT ANGIO CHEST
2 of 8 series · 18 of 46 positions shown · IV contrast (Omni 300)
Comparison: CT 06/02/2009 and chest x-ray today.

CLINICAL DATA: fever, chills, ha, nausea, vomiting, and myalgias x
1 week. Fever of 102 noted at home with chills. She also complained
of left-sided lower back pain, dull and constant, myalgias,
generalized weakness and fatigue. CXR in ED showed right lung
pneumonia

EXAM:
CT ANGIOGRAPHY CHEST WITH CONTRAST
TECHNIQUE: Multidetector CT imaging of the chest was performed using the
standard protocol during bolus administration of intravenous
contrast. Multiplanar CT image reconstructions and MIPs were
obtained to evaluate the vascular anatomy.
CONTRAST:  67mL OMNIPAQUE IOHEXOL 350 MG/ML SOLN

[Series 5: thins · axial · 0.67mm/px · z∈[-208,+16]mm · 15 of 247 slices shown]
[im 12/247  lung]
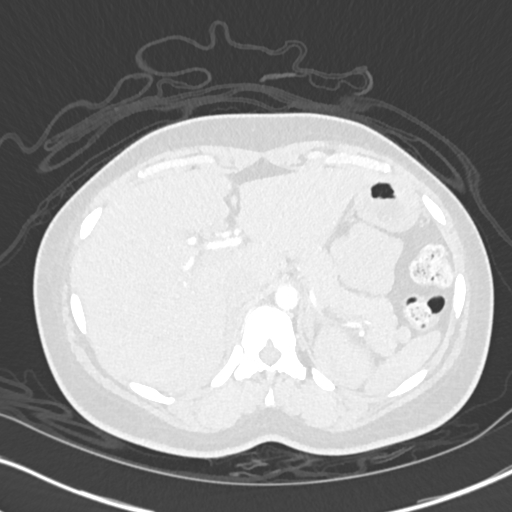
[im 34/247  soft-tissue]
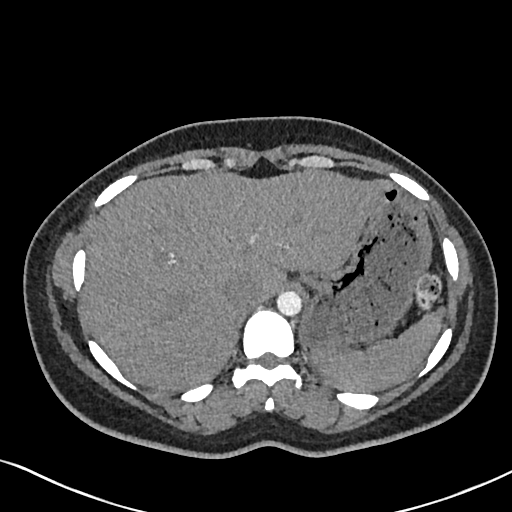
[im 45/247  lung]
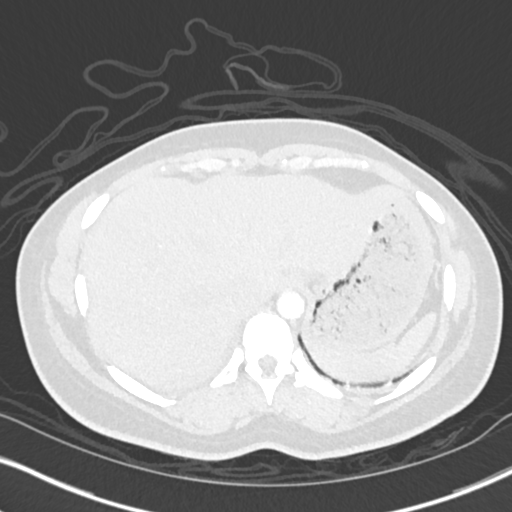
[im 56/247  soft-tissue]
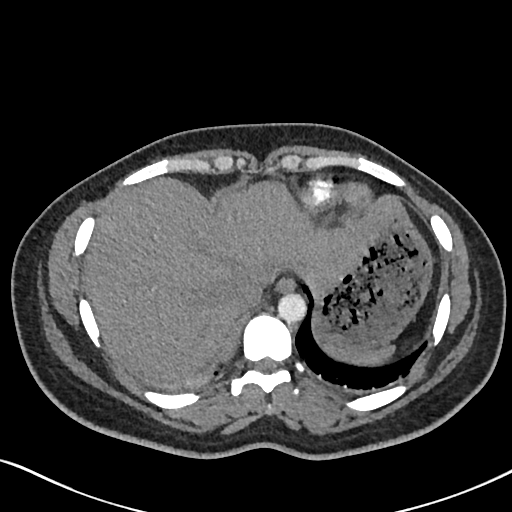
[im 79/247  lung]
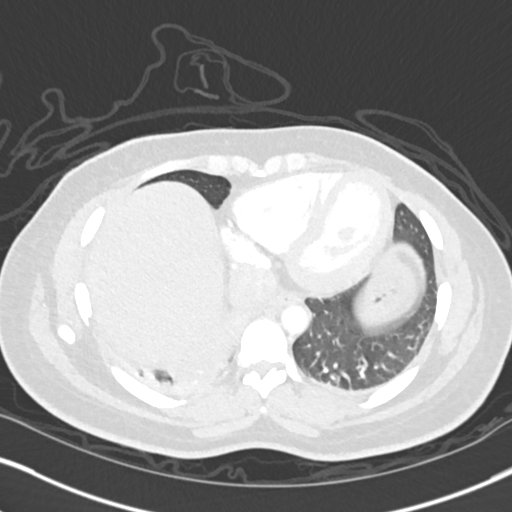
[im 90/247  soft-tissue]
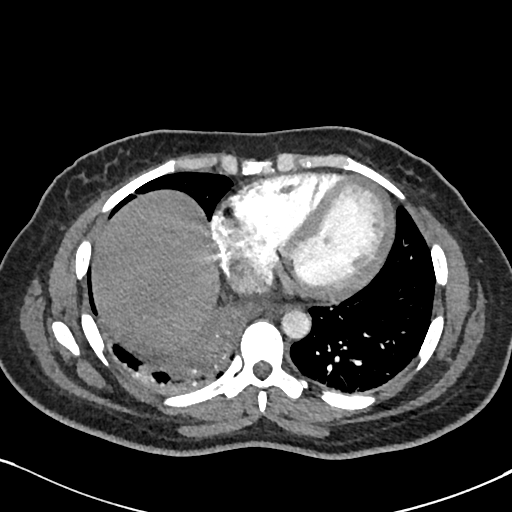
[im 112/247  lung]
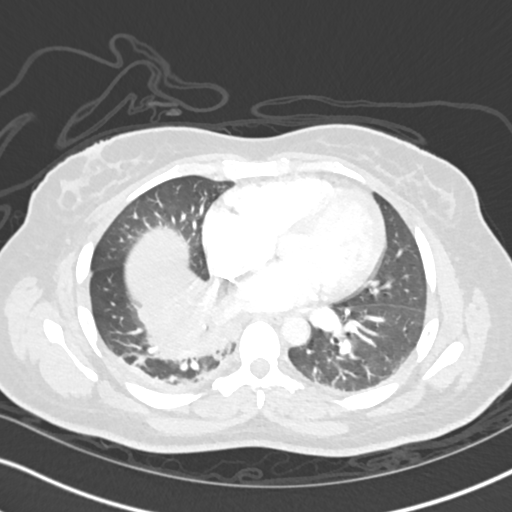
[im 124/247  soft-tissue]
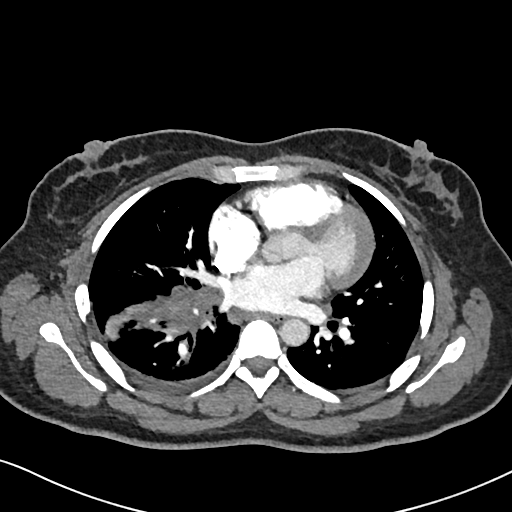
[im 135/247  lung]
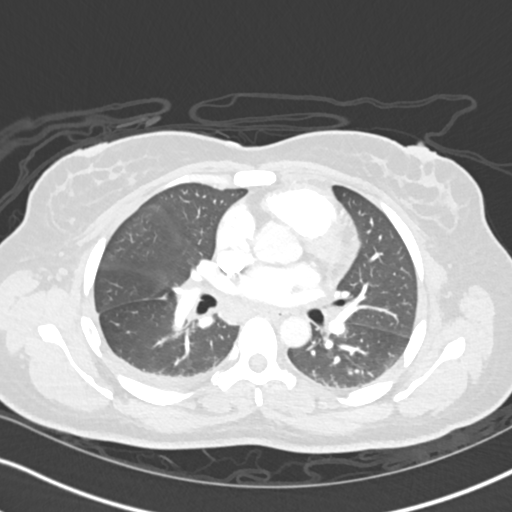
[im 157/247  soft-tissue]
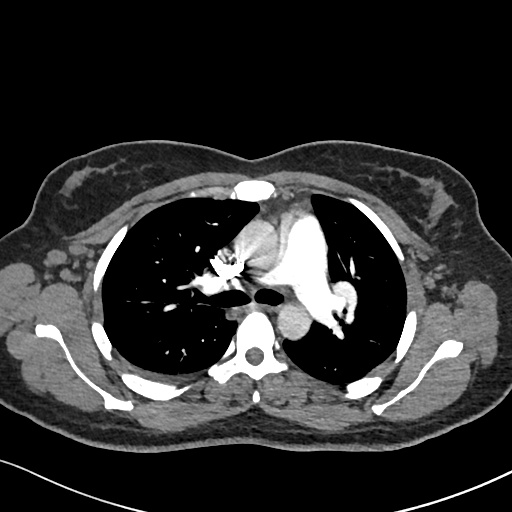
[im 168/247  lung]
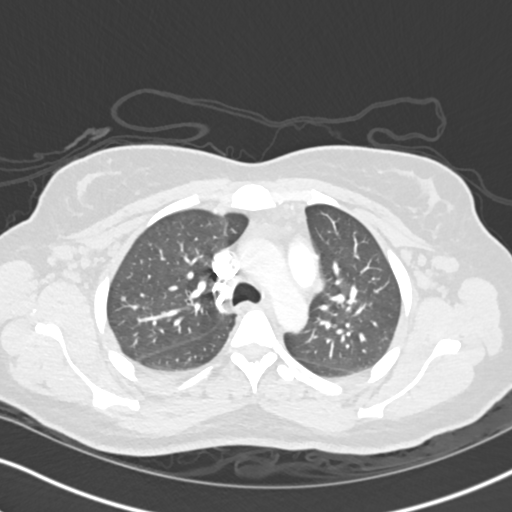
[im 191/247  soft-tissue]
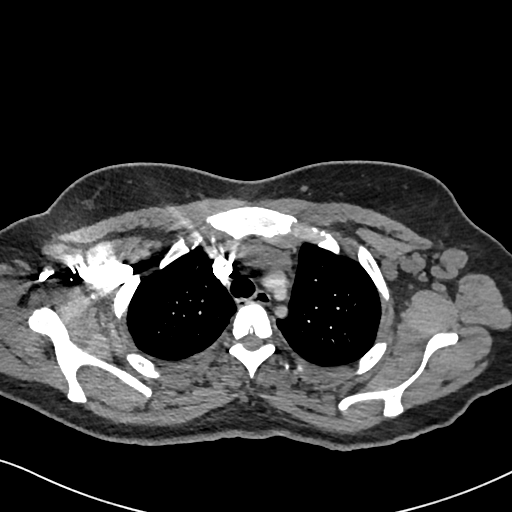
[im 202/247  lung]
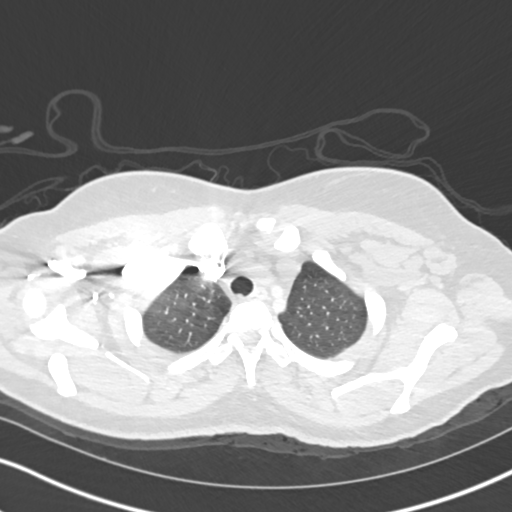
[im 213/247  soft-tissue]
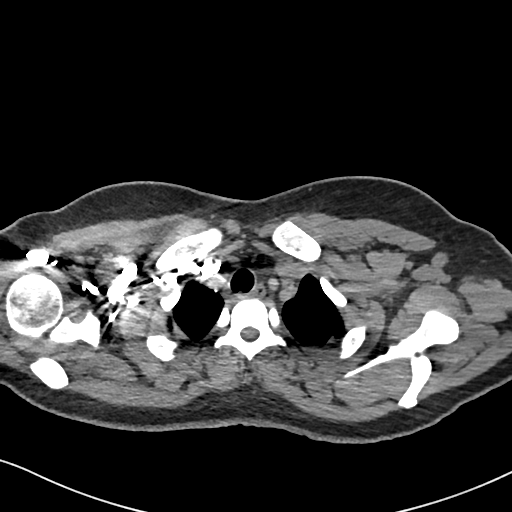
[im 235/247  lung]
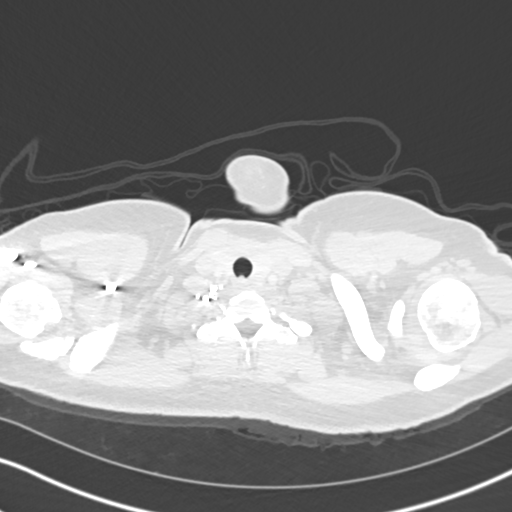

[Series 7: coronal mpr · coronal · 0.49mm/px · 3 of 92 slices shown]
[im 23/92  soft-tissue]
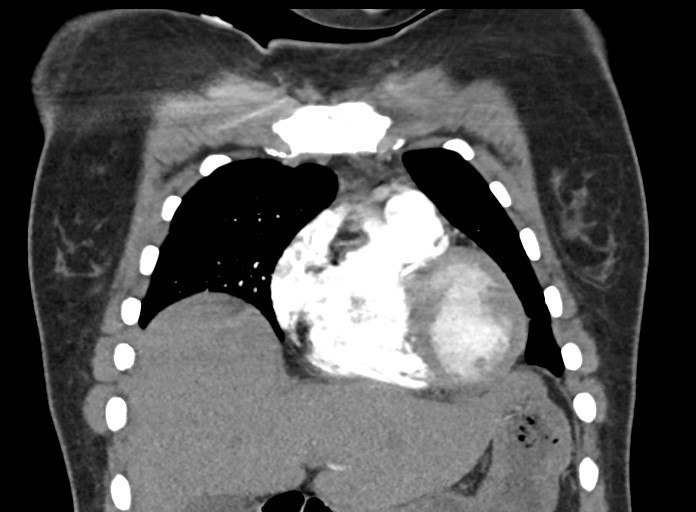
[im 46/92  soft-tissue]
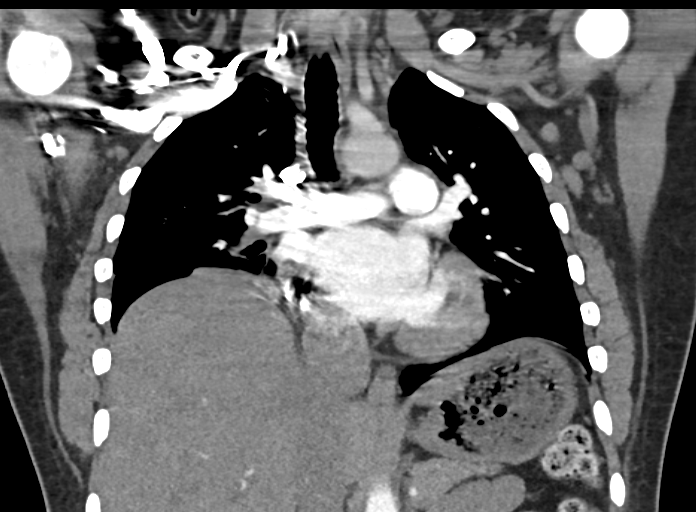
[im 69/92  soft-tissue]
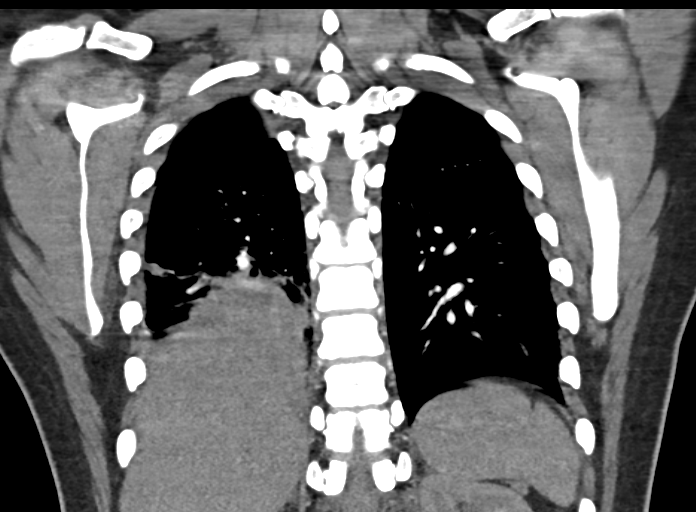

[18 of 46 positions shown; findings below may reference images not displayed]

FINDINGS: Lungs are adequately inflated and demonstrate moderate a low-density
consolidation over the right lower lobe with small right-sided
effusion. Findings likely due to infection. Remainder of the lungs
are clear. Possible mild low density right hilar and subcarinal
adenopathy likely reactive. There is mild cardiomegaly. There is no
evidence of pulmonary embolism. Minimal low density material over
the to mediastinum unchanged. Slight increased number and prominence
of bilateral axillary lymph nodes unchanged.

Images through the upper abdomen demonstrate no definite focal
abnormality. Remaining bones soft tissues are within normal.

Review of the MIP images confirms the above findings.
IMPRESSION: Low density consolidation over the right lower lobe with small
associated right pleural effusion. Findings are likely due to
pneumonia. Mild reactive right hilar and subcarinal adenopathy.
Recommend follow-up to resolution.

Mild cardiomegaly.

## 2016-02-02 ENCOUNTER — Ambulatory Visit: Payer: Medicaid Other | Admitting: Internal Medicine

## 2016-04-12 ENCOUNTER — Emergency Department (HOSPITAL_COMMUNITY)
Admission: EM | Admit: 2016-04-12 | Discharge: 2016-04-12 | Disposition: A | Discharge status: Home / Self Care | Payer: Medicaid Other | Attending: Emergency Medicine | Admitting: Emergency Medicine

## 2016-04-12 ENCOUNTER — Encounter (HOSPITAL_COMMUNITY): Payer: Self-pay | Admitting: *Deleted

## 2016-04-12 DIAGNOSIS — R197 Diarrhea, unspecified: Secondary | ICD-10-CM | POA: Insufficient documentation

## 2016-04-12 DIAGNOSIS — Z3202 Encounter for pregnancy test, result negative: Secondary | ICD-10-CM | POA: Insufficient documentation

## 2016-04-12 DIAGNOSIS — Z862 Personal history of diseases of the blood and blood-forming organs and certain disorders involving the immune mechanism: Secondary | ICD-10-CM | POA: Insufficient documentation

## 2016-04-12 DIAGNOSIS — R1084 Generalized abdominal pain: Secondary | ICD-10-CM | POA: Insufficient documentation

## 2016-04-12 DIAGNOSIS — R630 Anorexia: Secondary | ICD-10-CM | POA: Insufficient documentation

## 2016-04-12 DIAGNOSIS — Z8679 Personal history of other diseases of the circulatory system: Secondary | ICD-10-CM | POA: Insufficient documentation

## 2016-04-12 DIAGNOSIS — R112 Nausea with vomiting, unspecified: Secondary | ICD-10-CM | POA: Insufficient documentation

## 2016-04-12 DIAGNOSIS — F17210 Nicotine dependence, cigarettes, uncomplicated: Secondary | ICD-10-CM | POA: Insufficient documentation

## 2016-04-12 LAB — CBC WITH DIFFERENTIAL/PLATELET
BASOS ABS: 0 10*3/uL (ref 0.0–0.1)
BASOS PCT: 0 %
EOS ABS: 0 10*3/uL (ref 0.0–0.7)
EOS PCT: 0 %
HEMATOCRIT: 37 % (ref 36.0–46.0)
Hemoglobin: 11.8 g/dL — ABNORMAL LOW (ref 12.0–15.0)
Lymphocytes Relative: 11 %
Lymphs Abs: 1 10*3/uL (ref 0.7–4.0)
MCH: 28.3 pg (ref 26.0–34.0)
MCHC: 31.9 g/dL (ref 30.0–36.0)
MCV: 88.7 fL (ref 78.0–100.0)
MONO ABS: 0.8 10*3/uL (ref 0.1–1.0)
MONOS PCT: 9 %
Neutro Abs: 7.4 10*3/uL (ref 1.7–7.7)
Neutrophils Relative %: 80 %
PLATELETS: 245 10*3/uL (ref 150–400)
RBC: 4.17 MIL/uL (ref 3.87–5.11)
RDW: 12.6 % (ref 11.5–15.5)
WBC: 9.2 10*3/uL (ref 4.0–10.5)

## 2016-04-12 LAB — COMPREHENSIVE METABOLIC PANEL
ALBUMIN: 3.2 g/dL — AB (ref 3.5–5.0)
ALT: 45 U/L (ref 14–54)
ANION GAP: 10 (ref 5–15)
AST: 97 U/L — ABNORMAL HIGH (ref 15–41)
Alkaline Phosphatase: 100 U/L (ref 38–126)
BILIRUBIN TOTAL: 0.6 mg/dL (ref 0.3–1.2)
BUN: 9 mg/dL (ref 6–20)
CO2: 22 mmol/L (ref 22–32)
Calcium: 8.6 mg/dL — ABNORMAL LOW (ref 8.9–10.3)
Chloride: 105 mmol/L (ref 101–111)
Creatinine, Ser: 0.62 mg/dL (ref 0.44–1.00)
GFR calc Af Amer: >60 mL/min (ref >60)
GFR calc non Af Amer: >60 mL/min (ref >60)
GLUCOSE: 103 mg/dL — AB (ref 65–99)
POTASSIUM: 3.6 mmol/L (ref 3.5–5.1)
SODIUM: 137 mmol/L (ref 135–145)
TOTAL PROTEIN: 6.7 g/dL (ref 6.5–8.1)

## 2016-04-12 LAB — HCG, QUANTITATIVE, PREGNANCY: hCG, Beta Chain, Quant, S: <1 m[IU]/mL (ref <5)

## 2016-04-12 MED ORDER — FAMOTIDINE IN NACL 20-0.9 MG/50ML-% IV SOLN
20.0000 mg | Freq: Once | INTRAVENOUS | Status: AC
  Administered 2016-04-12: 20 mg via INTRAVENOUS
  Filled 2016-04-12: qty 50

## 2016-04-12 MED ORDER — ONDANSETRON 4 MG PO TBDP: 4.0000 mg | ORAL_TABLET | Freq: Three times a day (TID) | ORAL | Status: DC | PRN

## 2016-04-12 MED ORDER — MORPHINE SULFATE (PF) 4 MG/ML IV SOLN
4.0000 mg | Freq: Once | INTRAVENOUS | Status: AC
  Administered 2016-04-12: 4 mg via INTRAVENOUS
  Filled 2016-04-12: qty 1

## 2016-04-12 MED ORDER — ONDANSETRON HCL 4 MG/2ML IJ SOLN
4.0000 mg | Freq: Once | INTRAMUSCULAR | Status: AC
  Administered 2016-04-12: 4 mg via INTRAVENOUS
  Filled 2016-04-12: qty 2

## 2016-04-12 MED ORDER — SODIUM CHLORIDE 0.9 % IV BOLUS (SEPSIS)
1000.0000 mL | Freq: Once | INTRAVENOUS | Status: AC
  Administered 2016-04-12: 1000 mL via INTRAVENOUS

## 2016-04-12 MED ORDER — ONDANSETRON 4 MG PO TBDP
8.0000 mg | ORAL_TABLET | Freq: Once | ORAL | Status: AC
  Administered 2016-04-12: 8 mg via ORAL
  Filled 2016-04-12: qty 2

## 2016-04-12 MED ORDER — SODIUM CHLORIDE 0.9 % IV BOLUS (SEPSIS)
1000.0000 mL | Freq: Once | INTRAVENOUS | Status: AC
Start: 2016-04-12 — End: 2016-04-12
  Administered 2016-04-12: 1000 mL via INTRAVENOUS

## 2016-04-12 NOTE — Discharge Instructions (Signed)
Also can start taking a probiotic

## 2016-04-12 NOTE — Progress Notes (Signed)
Spoke to patient regarding primary care resources and [facility]. Patient sts she has insurance through [employer] and has an upcoming appointment to establish primary care in the upcoming weeks. My contact information provided for any future questions or concerns. No other [facility]ligibility Specialist needs identified at this time.   Buddy DutyFelicia Evans [id number] [phone]

## 2016-04-12 NOTE — ED Provider Notes (Signed)
CSN: [id number]     Arrival date & time 04/12/16  0610 History   First MD Initiated Contact with Patient 04/12/16 [phone]     Chief Complaint  Patient presents with  . Abdominal Pain     (Consider location/radiation/quality/duration/timing/severity/associated sxs/prior Treatment) HPI Comments: Patient is a 40 year old female with a history of sarcoidosis presenting today with epigastric abdominal pain, nausea, vomiting and diarrhea. Patient states over the last few days her stomach has been unsettled for which she started taking Prilosec but last night she started having diarrhea. She's had approximately 10 episodes of diarrhea in the last 12 hours and then around 5 AM this morning she started to vomit. She has vomited 5 times in the last 2 hours. She denies any blood in her vomitus or stool. No recent antibiotics or travel.  Patient is a 40 y.o. female presenting with abdominal pain. The history is provided by the patient.  Abdominal Pain Pain location:  Epigastric Pain quality: aching, cramping and gnawing   Pain radiation: to the entire abdomen. Pain severity:  Moderate Onset quality:  Gradual Duration:  3 days Timing:  Constant Progression:  Worsening Chronicity:  New Context: awakening from sleep   Context: not medication withdrawal, not previous surgeries, not recent illness, not sick contacts and not suspicious food intake   Relieved by:  Nothing Worsened by:  Eating Ineffective treatments:  None tried Associated symptoms: anorexia, diarrhea, nausea and vomiting   Associated symptoms: no chest pain, no constipation, no cough, no dysuria, no fever, no shortness of breath, no vaginal bleeding and no vaginal discharge   Risk factors: NSAID use   Risk factors: no alcohol abuse, has not had multiple surgeries, not pregnant and no recent hospitalization     Past Medical History  Diagnosis Date  . Migraine   . Sarcoidosis (HCC)   . Vertigo    Past Surgical History  Procedure  Laterality Date  . Cesarean section    . Knee surgery    . Cesarean section     No family history on file. Social History  Substance Use Topics  . Smoking status: Current Every Day Smoker -- 1.00 packs/day    Types: Cigarettes  . Smokeless tobacco: None  . Alcohol Use: Yes   OB History    No data available     Review of Systems  Constitutional: Negative for fever.  Respiratory: Negative for cough and shortness of breath.   Cardiovascular: Negative for chest pain.  Gastrointestinal: Positive for nausea, vomiting, abdominal pain, diarrhea and anorexia. Negative for constipation.  Genitourinary: Negative for dysuria, vaginal bleeding and vaginal discharge.  All other systems reviewed and are negative.     Allergies  Fish allergy; Naprosyn; and Sulfa antibiotics  Home Medications   Prior to Admission medications   Medication Sig Start Date End Date Taking? Authorizing Provider  azithromycin (ZITHROMAX Z-PAK) 250 MG tablet 2 po day one, then 1 daily x 4 days Patient not taking: Reported on 10/13/2015 09/06/15   Arthor Captain, PA-C  ferrous sulfate 325 (65 FE) MG tablet Take 1 tablet (325 mg total) by mouth 2 (two) times daily with a meal. Patient not taking: Reported on 08/19/2015 09/30/14   Stephani Police, PA-C  HYDROcodone-acetaminophen (NORCO/VICODIN) 5-325 MG tablet Take 1-2 tablets by mouth every 6 (six) hours as needed for severe pain. 10/15/15   Danelle Berry, PA-C  HYDROcodone-homatropine (HYCODAN) 5-1.5 MG/5ML syrup Take 5 mLs by mouth every 6 (six) hours as needed for cough. Patient not  taking: Reported on 10/13/2015 09/06/15   Arthor CaptainAbigail Harris, PA-C  ibuprofen (ADVIL,MOTRIN) 600 MG tablet Take 1 tablet (600 mg total) by mouth every 6 (six) hours as needed. Patient not taking: Reported on 10/13/2015 08/29/15   Derwood KaplanAnkit Nanavati, MD  indomethacin (INDOCIN) 50 MG capsule Take 1 capsule (50 mg total) by mouth 3 (three) times daily as needed for moderate pain. 10/15/15   Danelle BerryLeisa  Tapia, PA-C  levofloxacin (LEVAQUIN) 750 MG tablet Take 1 tablet (750 mg total) by mouth daily. Patient not taking: Reported on 08/19/2015 09/30/14   Stephani PoliceMarianne L York, PA-C  meclizine (ANTIVERT) 25 MG tablet Take 1 tablet (25 mg total) by mouth 3 (three) times daily as needed for dizziness. 10/14/15   Earley FavorGail Schulz, NP  nicotine (EQ NICOTINE) 21 mg/24hr patch Place 1 patch (21 mg total) onto the skin daily. Absolutely do not wear the patch and smoke cigarettes. Patient not taking: Reported on 08/19/2015 09/30/14   Tora KindredMarianne L York, PA-C  ondansetron (ZOFRAN ODT) 4 MG disintegrating tablet Take 1 tablet (4 mg total) by mouth every 8 (eight) hours as needed for nausea or vomiting. Patient not taking: Reported on 08/19/2015 10/09/14   Renne CriglerJoshua Geiple, PA-C  triamcinolone cream (KENALOG) 0.1 % Apply 1 application topically 2 (two) times daily. Patient not taking: Reported on 10/13/2015 08/19/15   Charlestine Nighthristopher Lawyer, PA-C   BP 132/67 mmHg  Pulse 82  Resp 20  Ht 5\' 4"  (1.626 m)  Wt 181 lb (82.101 kg)  BMI 31.05 kg/m2  SpO2 100%  LMP 06/22/2015 (Approximate) Physical Exam  Constitutional: She is oriented to person, place, and time. She appears well-developed and well-nourished. She appears distressed.  Appears uncomfortable  HENT:  Head: Normocephalic and atraumatic.  Mouth/Throat: Oropharynx is clear and moist.  Eyes: Conjunctivae and EOM are normal. Pupils are equal, round, and reactive to light.  Neck: Normal range of motion. Neck supple.  Cardiovascular: Normal rate, regular rhythm and intact distal pulses.   No murmur heard. Pulmonary/Chest: Effort normal and breath sounds normal. No respiratory distress. She has no wheezes. She has no rales.  Abdominal: Soft. She exhibits no distension. Bowel sounds are decreased. There is tenderness. There is no rebound and no guarding.  abd is soft and mild diffuse tenderness.  Tenderness most pronounced in epigastric area  Musculoskeletal: Normal range of  motion. She exhibits no edema or tenderness.  Neurological: She is alert and oriented to person, place, and time.  Skin: Skin is warm and dry. No rash noted. No erythema.  Psychiatric: She has a normal mood and affect. Her behavior is normal.  Nursing note and vitals reviewed.   ED Course  Procedures (including critical care time) Labs Review Labs Reviewed  CBC WITH DIFFERENTIAL/PLATELET - Abnormal; Notable for the following:    Hemoglobin 11.8 (*)    All other components within normal limits  COMPREHENSIVE METABOLIC PANEL - Abnormal; Notable for the following:    Glucose, Bld 103 (*)    Calcium 8.6 (*)    Albumin 3.2 (*)    AST 97 (*)    All other components within normal limits  HCG, QUANTITATIVE, PREGNANCY    Imaging Review No results found. I have personally reviewed and evaluated these images and lab results as part of my medical decision-making.   EKG Interpretation None      MDM   Final diagnoses:  Nausea vomiting and diarrhea    Pt with symptoms most consistent with a viral process with repetitive vomitting/diarrhea.  States that  she has had some mild abd pain over the last 2 days prompting her to take prilosec but then diarrhea started last night.  Denies bad food exposure and recent travel out of the country.  No recent abx.  No hx concerning for GU pathology or kidney stones.  Pt is awake and alert on exam without peritoneal signs.  VS wnl. CBC, CMP, HCG pending.  IVF and zofran/morphine given.  9:41 AM Labs with mild elevation of AST but otherwise within normal limits. On repeat evaluation patient states she was feeling better but now her sensations are starting to return after having some coke to drink. She was given a second dose of pain and nausea medicine as well as a second liter of IV fluids and Pepcid. She states she's hungry and wants to try to eat. At this time she only has epigastric discomfort. Low suspicion for appendicitis, diverticulitis or  cholecystitis at this time.    Gwyneth Sprout, MD 04/12/16 1225

## 2016-04-12 NOTE — ED Notes (Signed)
Pt in br since she signed in  Unable to triage

## 2016-04-12 NOTE — ED Notes (Signed)
Pt tolerating PO at time of discharge

## 2016-04-12 NOTE — ED Notes (Signed)
The pt c/o abd pain n v and diarrhea after eating some chicken and rice last pm  lmp march

## 2016-04-12 NOTE — ED Notes (Signed)
Pt hyperventilating  Back in br to change her pants from diarrhea

## 2016-04-12 NOTE — ED Notes (Signed)
Her periods are irregular

## 2016-04-25 ENCOUNTER — Encounter: Payer: Self-pay | Admitting: Family

## 2016-04-25 ENCOUNTER — Ambulatory Visit (INDEPENDENT_AMBULATORY_CARE_PROVIDER_SITE_OTHER): Payer: BLUE CROSS/BLUE SHIELD | Admitting: Family

## 2016-04-25 VITALS — BP 118/80 | HR 65 | Temp 97.7°F | Resp 16 | Ht 64.0 in | Wt 180.0 lb

## 2016-04-25 DIAGNOSIS — E669 Obesity, unspecified: Secondary | ICD-10-CM

## 2016-04-25 DIAGNOSIS — Z0000 Encounter for general adult medical examination without abnormal findings: Secondary | ICD-10-CM

## 2016-04-25 DIAGNOSIS — Z720 Tobacco use: Secondary | ICD-10-CM | POA: Diagnosis not present

## 2016-04-25 NOTE — Progress Notes (Signed)
Pre visit review using our clinic review tool, if applicable. No additional management support is needed unless otherwise documented below in the visit note. 

## 2016-04-25 NOTE — Assessment & Plan Note (Signed)
1) Anticipatory Guidance: Discussed importance of wearing a seatbelt while driving and not texting while driving; changing batteries in smoke detector at least once annually; wearing suntan lotion when outside; eating a balanced and moderate diet; getting physical activity at least 30 minutes per day.  2) Immunizations / Screenings / Labs:  Declines tetanus. All other immunizations are up-to-date per recommendations. Obtain vitamin D for vitamin D deficiency screening. Encouraged to complete dental exam independently. Due for a Pap smear encouraged to follow-up with gynecology. All other screenings are up-to-date per recommendations. Obtain CBC, CMET, and lipid profile.  Overall well exam with risk factors for cardiovascular disease including tobacco use and obesity. Recommend weight loss approximate 5-10% of current body weight through nutrition and physical activity.Recommend increasing physical activity to 30 minutes of moderate level activity daily. Encourage nutritional intake that focuses on nutrient dense foods and is moderate, varied, and balanced and is low in saturated fats and processed/sugary foods. Discussed tobacco use and risk for future cardiovascular and respiratory diseases. Patient is not ready to quit smoking at this time. Smoking cessation information provided and after visit summary. Follow-up prevention exam in 1 year. Follow-up office visit pending blood work if necessary.

## 2016-04-25 NOTE — Patient Instructions (Addendum)
Thank you for choosing [facility].  Summary/Instructions:  Please stop by the lab on the basement level of the building for your blood work. Your results will be released to [facility] (or called to you) after review, usually within 72 hours after test completion. If any changes need to be made, you will be notified at that same time.  Health Maintenance, Female Adopting a healthy lifestyle and getting preventive care can go a long way to promote health and wellness. Talk with your health care provider about what schedule of regular examinations is right for you. This is a good chance for you to check in with your provider about disease prevention and staying healthy. In between checkups, there are plenty of things you can do on your own. Experts have done a lot of research about which lifestyle changes and preventive measures are most likely to keep you healthy. Ask your health care provider for more information. WEIGHT AND DIET  Eat a healthy diet  Be sure to include plenty of vegetables, fruits, low-fat dairy products, and lean protein.  Do not eat a lot of foods high in solid fats, added sugars, or salt.  Get regular exercise. This is one of the most important things you can do for your health.  Most adults should exercise for at least 150 minutes each week. The exercise should increase your heart rate and make you sweat (moderate-intensity exercise).  Most adults should also do strengthening exercises at least twice a week. This is in addition to the moderate-intensity exercise.  Maintain a healthy weight  Body mass index (BMI) is a measurement that can be used to identify possible weight problems. It estimates body fat based on height and weight. Your health care provider can help determine your BMI and help you achieve or maintain a healthy weight.  For females 41 years of age and older:   A BMI below 18.5 is considered underweight.  A BMI of 18.5 to 24.9 is normal.  A  BMI of 25 to 29.9 is considered overweight.  A BMI of 30 and above is considered obese.  Watch levels of cholesterol and blood lipids  You should start having your blood tested for lipids and cholesterol at 41 years of age blood tested for lipids and cholesterol at 41 years of age, then have this test every 5 years.  You may need to have your cholesterol levels checked more often if:  Your lipid or cholesterol levels are high.  You are older than 41 years of age. more often if:  Your lipid or cholesterol levels are high.  You are older than 41 years of age.  You are at high risk for heart disease.  CANCER SCREENING   Lung Cancer  Lung cancer screening is recommended for adults 41-17 years old who are at high risk for lung cancer because of a history of smoking.  A yearly low-dose CT scan of the lungs is recommended for people who:  Currently smoke.  Have quit within the past 15 years.  Have at least a 30-pack-year history of smoking. A pack year is smoking an average of one pack of cigarettes a day for 1 year.  Yearly screening should continue until it has been 15 years since you quit.  Yearly screening should stop if you develop a health problem that would prevent you from having lung cancer treatment.  Breast Cancer  Practice breast self-awareness. This means understanding how your breasts normally appear and feel.  It also means doing regular breast self-exams. Let your health care provider know about any changes, no matter how small.  If you are in your 20s or 30s, you should have a clinical  breast exam (CBE) by a health care provider every 1-3 years as part of a regular health exam.  If you are 41 or older, have a CBE every year. Also consider having a breast X-ray (mammogram) every year.  If you have a family history of breast cancer, talk to your health care provider about genetic screening.  If you are at high risk for breast cancer, talk to your health care provider about having an MRI and a mammogram every year.  Breast cancer gene (BRCA) assessment is recommended for women who have family members with  BRCA-related cancers. BRCA-related cancers include:  Breast.  Ovarian.  Tubal.  Peritoneal cancers.  Results of the assessment will determine the need for genetic counseling and BRCA1 and BRCA2 testing. Cervical Cancer Your health care provider may recommend that you be screened regularly for cancer of the pelvic organs (ovaries, uterus, and vagina). This screening involves a pelvic examination, including checking for microscopic changes to the surface of your cervix (Pap test). You may be encouraged to have this screening done every 3 years, beginning at age 41.  For women ages 41-65, health care providers may recommend pelvic exams and Pap testing every 3 years, or they may recommend the Pap and pelvic exam, combined with testing for human papilloma virus (HPV), every 5 years. Some types of HPV increase your risk of cervical cancer. Testing for HPV may also be done on women of any age with unclear Pap test results.  Other health care providers may not recommend any screening for nonpregnant women who are considered low risk for pelvic cancer and who do not have symptoms. Ask your health care provider if a screening pelvic exam is right for you.  If you have had past treatment for cervical cancer or a condition that could lead to cancer, you need Pap tests and screening for cancer for at least 20 years after your treatment. If Pap tests have been discontinued, your risk factors (such as having a new sexual partner) need to be reassessed to determine if screening should resume. Some women have medical problems that increase the chance of getting cervical cancer. In these cases, your health care provider may recommend more frequent screening and Pap tests. Colorectal Cancer  This type of cancer can be detected and often prevented.  Routine colorectal cancer screening usually begins at 41 years of age and continues through 41 years of age.  Your health care provider may recommend screening at  an earlier age if you have risk factors for colon cancer.  Your health care provider may also recommend using home test kits to check for hidden blood in the stool.  A small camera at the end of a tube can be used to examine your colon directly (sigmoidoscopy or colonoscopy). This is done to check for the earliest forms of colorectal cancer.  Routine screening usually begins at age 60.  Direct examination of the colon should be repeated every 5-10 years through 41 years of age. However, you may need to be screened more often if early forms of precancerous polyps or small growths are found. Skin Cancer  Check your skin from head to toe regularly.  Tell your health care provider about any new moles or changes in moles, especially if there is a change in a mole's shape or color.  Also tell your health care provider if you have a mole that is larger than the size of a pencil eraser.  Always use sunscreen. Apply sunscreen liberally and  repeatedly throughout the day.  Protect yourself by wearing long sleeves, pants, a wide-brimmed hat, and sunglasses whenever you are outside. HEART DISEASE, DIABETES, AND HIGH BLOOD PRESSURE   High blood pressure causes heart disease and increases the risk of stroke. High blood pressure is more likely to develop in:  People who have blood pressure in the high end of the normal range (130-139/85-89 mm Hg).  People who are overweight or obese.  People who are African American.  If you are 6-75 years of age, have your blood pressure checked every 3-5 years. If you are 78 years of age or older, have your blood pressure checked every year. You should have your blood pressure measured twice--once when you are at a hospital or clinic, and once when you are not at a hospital or clinic. Record the average of the two measurements. To check your blood pressure when you are not at a hospital or clinic, you can use:  An automated blood pressure machine at a  pharmacy.  A home blood pressure monitor.  If you are between 33 years and 77 years old, ask your health care provider if you should take aspirin to prevent strokes.  Have regular diabetes screenings. This involves taking a blood sample to check your fasting blood sugar level.  If you are at a normal weight and have a low risk for diabetes, have this test once every three years after 41 years of age.  If you are overweight and have a high risk for diabetes, consider being tested at a younger age or more often. PREVENTING INFECTION  Hepatitis B  If you have a higher risk for hepatitis B, you should be screened for this virus. You are considered at high risk for hepatitis B if:  You were born in a country where hepatitis B is common. Ask your health care provider which countries are considered high risk.  Your parents were born in a high-risk country, and you have not been immunized against hepatitis B (hepatitis B vaccine).  You have HIV or AIDS.  You use needles to inject street drugs.  You live with someone who has hepatitis B.  You have had sex with someone who has hepatitis B.  You get hemodialysis treatment.  You take certain medicines for conditions, including cancer, organ transplantation, and autoimmune conditions. Hepatitis C  Blood testing is recommended for:  Everyone born from [birth year] through 1966.  Anyone with known risk factors for hepatitis C. Sexually transmitted infections (STIs)  You should be screened for sexually transmitted infections (STIs) including gonorrhea and chlamydia if:  You are sexually active and are younger than 41 years of age.  You are older than 41 years of age and your health care provider tells you that you are at risk for this type of infection.  Your sexual activity has changed since you were last screened and you are at an increased risk for chlamydia or gonorrhea. Ask your health care provider if you are at risk.  If you do not  have HIV, but are at risk, it may be recommended that you take a prescription medicine daily to prevent HIV infection. This is called pre-exposure prophylaxis (PrEP). You are considered at risk if:  You are sexually active and do not regularly use condoms or know the HIV status of your partner(s).  You take drugs by injection.  You are sexually active with a partner who has HIV. Talk with your health care provider about whether you are at  high risk of being infected with HIV. If you choose to begin PrEP, you should first be tested for HIV. You should then be tested every 3 months for as long as you are taking PrEP.  PREGNANCY   If you are premenopausal and you may become pregnant, ask your health care provider about preconception counseling.  If you may become pregnant, take 400 to 800 micrograms (mcg) of folic acid every day.  If you want to prevent pregnancy, talk to your health care provider about birth control (contraception). OSTEOPOROSIS AND MENOPAUSE   Osteoporosis is a disease in which the bones lose minerals and strength with aging. This can result in serious bone fractures. Your risk for osteoporosis can be identified using a bone density scan.  If you are 33 years of age or older, or if you are at risk for osteoporosis and fractures, ask your health care provider if you should be screened.  Ask your health care provider whether you should take a calcium or vitamin D supplement to lower your risk for osteoporosis.  Menopause may have certain physical symptoms and risks.  Hormone replacement therapy may reduce some of these symptoms and risks. Talk to your health care provider about whether hormone replacement therapy is right for you.  HOME CARE INSTRUCTIONS   Schedule regular health, dental, and eye exams.  Stay current with your immunizations.   Do not use any tobacco products including cigarettes, chewing tobacco, or electronic cigarettes.  If you are pregnant, do not  drink alcohol.  If you are breastfeeding, limit how much and how often you drink alcohol.  Limit alcohol intake to no more than 1 drink per day for nonpregnant women. One drink equals 12 ounces of beer, 5 ounces of wine, or 1 ounces of hard liquor.  Do not use street drugs.  Do not share needles.  Ask your health care provider for help if you need support or information about quitting drugs.  Tell your health care provider if you often feel depressed.  Tell your health care provider if you have ever been abused or do not feel safe at home.   This information is not intended to replace advice given to you by your health care provider. Make sure you discuss any questions you have with your health care provider.   Document Released: 05/23/2011 Document Revised: 11/28/2014 Document Reviewed: 10/09/2013 Elsevier Interactive Patient Education 2016 [employer].  Smoking Hazards Smoking cigarettes is extremely bad for your health. Tobacco smoke has over 200 known poisons in it. It contains the poisonous gases nitrogen oxide and carbon monoxide. There are over 60 chemicals in tobacco smoke that cause cancer. Some of the chemicals found in cigarette smoke include:   Cyanide.   Benzene.   Formaldehyde.   Methanol (wood alcohol).   Acetylene (fuel used in welding torches).   Ammonia.  Even smoking lightly shortens your life expectancy by several years. You can greatly reduce the risk of medical problems for you and your family by stopping now. Smoking is the most preventable cause of death and disease in our society. Within days of quitting smoking, your circulation improves, you decrease the risk of having a heart attack, and your lung capacity improves. There may be some increased phlegm in the first few days after quitting, and it may take months for your lungs to clear up completely. Quitting for 10 years reduces your risk of developing lung cancer to almost that of a nonsmoker.   WHAT ARE THE RISKS  OF SMOKING? Cigarette smokers have an increased risk of many serious medical problems, including:  Lung cancer.   Lung disease (such as pneumonia, bronchitis, and emphysema).   Heart attack and chest pain due to the heart not getting enough oxygen (angina).   Heart disease and peripheral blood vessel disease.   Hypertension.   Stroke.   Oral cancer (cancer of the lip, mouth, or voice box).   Bladder cancer.   Pancreatic cancer.   Cervical cancer.   Pregnancy complications, including premature birth.   Stillbirths and smaller newborn babies, birth defects, and genetic damage to sperm.   Early menopause.   Lower estrogen level for women.   Infertility.   Facial wrinkles.   Blindness.   Increased risk of broken bones (fractures).   Senile dementia.   Stomach ulcers and internal bleeding.   Delayed wound healing and increased risk of complications during surgery. Because of secondhand smoke exposure, children of smokers have an increased risk of the following:   Sudden infant death syndrome (SIDS).   Respiratory infections.   Lung cancer.   Heart disease.   Ear infections.  WHY IS SMOKING ADDICTIVE? Nicotine is the chemical agent in tobacco that is capable of causing addiction or dependence. When you smoke and inhale, nicotine is absorbed rapidly into the bloodstream through your lungs. Both inhaled and noninhaled nicotine may be addictive.  WHAT ARE THE BENEFITS OF QUITTING?  There are many health benefits to quitting smoking. Some are:   The likelihood of developing cancer and heart disease decreases. Health improvements are seen almost immediately.   Blood pressure, pulse rate, and breathing patterns start returning to normal soon after quitting.   People who quit may see an improvement in their overall quality of life.  HOW DO YOU QUIT SMOKING? Smoking is an addiction with both physical and  psychological effects, and longtime habits can be hard to change. Your health care provider can recommend:  Programs and community resources, which may include group support, education, or therapy.  Replacement products, such as patches, gum, and nasal sprays. Use these products only as directed. Do not replace cigarette smoking with electronic cigarettes (commonly called e-cigarettes). The safety of e-cigarettes is unknown, and some may contain harmful chemicals. FOR MORE INFORMATION  American Lung Association: www.lung.org  American Cancer Society: www.cancer.org   This information is not intended to replace advice given to you by your health care provider. Make sure you discuss any questions you have with your health care provider.   Document Released: 12/15/2004 Document Revised: 08/28/2013 Document Reviewed: 04/29/2013 Elsevier Interactive Patient Education 2016 [employer].  Steps to Quit Smoking  Smoking tobacco can be harmful to your health and can affect almost every organ in your body. Smoking puts you, and those around you, at risk for developing many serious chronic diseases. Quitting smoking is difficult, but it is one of the best things that you can do for your health. It is never too late to quit. WHAT ARE THE BENEFITS OF QUITTING SMOKING? When you quit smoking, you lower your risk of developing serious diseases and conditions, such as:  Lung cancer or lung disease, such as COPD.  Heart disease.  Stroke.  Heart attack.  Infertility.  Osteoporosis and bone fractures. Additionally, symptoms such as coughing, wheezing, and shortness of breath may get better when you quit. You may also find that you get sick less often because your body is stronger at fighting off colds and infections. If you are pregnant, quitting  smoking can help to reduce your chances of having a baby of low birth weight. HOW DO I GET READY TO QUIT? When you decide to quit smoking, create a plan to  make sure that you are successful. Before you quit:  Pick a date to quit. Set a date within the next two weeks to give you time to prepare.  Write down the reasons why you are quitting. Keep this list in places where you will see it often, such as on your bathroom mirror or in your car or wallet.  Identify the people, places, things, and activities that make you want to smoke (triggers) and avoid them. Make sure to take these actions:  Throw away all cigarettes at home, at work, and in your car.  Throw away smoking accessories, such as Scientist, research (medical).  Clean your car and make sure to empty the ashtray.  Clean your home, including curtains and carpets.  Tell your family, friends, and coworkers that you are quitting. Support from your loved ones can make quitting easier.  Talk with your health care provider about your options for quitting smoking.  Find out what treatment options are covered by your health insurance. WHAT STRATEGIES CAN I USE TO QUIT SMOKING?  Talk with your healthcare provider about different strategies to quit smoking. Some strategies include:  Quitting smoking altogether instead of gradually lessening how much you smoke over a period of time. Research shows that quitting "cold [place]" is more successful than gradually quitting.  Attending in-person counseling to help you build problem-solving skills. You are more likely to have success in quitting if you attend several counseling sessions. Even short sessions of 10 minutes can be effective.  Finding resources and support systems that can help you to quit smoking and remain smoke-free after you quit. These resources are most helpful when you use them often. They can include:  Online chats with a Social worker.  Telephone quitlines.  Printed Furniture conservator/restorer.  Support groups or group counseling.  Text messaging programs.  Mobile phone applications.  Taking medicines to help you quit smoking. (If you are  pregnant or breastfeeding, talk with your health care provider first.) Some medicines contain nicotine and some do not. Both types of medicines help with cravings, but the medicines that include nicotine help to relieve withdrawal symptoms. Your health care provider may recommend:  Nicotine patches, gum, or lozenges.  Nicotine inhalers or sprays.  Non-nicotine medicine that is taken by mouth. Talk with your health care provider about combining strategies, such as taking medicines while you are also receiving in-person counseling. Using these two strategies together makes you more likely to succeed in quitting than if you used either strategy on its own. If you are pregnant or breastfeeding, talk with your health care provider about finding counseling or other support strategies to quit smoking. Do not take medicine to help you quit smoking unless told to do so by your health care provider. WHAT THINGS CAN I DO TO MAKE IT EASIER TO QUIT? Quitting smoking might feel overwhelming at first, but there is a lot that you can do to make it easier. Take these important actions:  Reach out to your family and friends and ask that they support and encourage you during this time. Call telephone quitlines, reach out to support groups, or work with a counselor for support.  Ask people who smoke to avoid smoking around you.  Avoid places that trigger you to smoke, such as bars, parties, or smoke-break areas  at work.  Spend time around people who do not smoke.  Lessen stress in your life, because stress can be a smoking trigger for some people. To lessen stress, try:  Exercising regularly.  Deep-breathing exercises.  Yoga.  Meditating.  Performing a body scan. This involves closing your eyes, scanning your body from head to toe, and noticing which parts of your body are particularly tense. Purposefully relax the muscles in those areas.  Download or purchase mobile phone or tablet apps (applications)  that can help you stick to your quit plan by providing reminders, tips, and encouragement. There are many free apps, such as QuitGuide from the [employer] Office manager for Disease Control and Prevention). You can find other support for quitting smoking (smoking cessation) through smokefree.gov and other websites. HOW WILL I FEEL WHEN I QUIT SMOKING? Within the first 24 hours of quitting smoking, you may start to feel some withdrawal symptoms. These symptoms are usually most noticeable 2-3 days after quitting, but they usually do not last beyond 2-3 weeks. Changes or symptoms that you might experience include:  Mood swings.  Restlessness, anxiety, or irritation.  Difficulty concentrating.  Dizziness.  Strong cravings for sugary foods in addition to nicotine.  Mild weight gain.  Constipation.  Nausea.  Coughing or a sore throat.  Changes in how your medicines work in your body.  A depressed mood.  Difficulty sleeping (insomnia). After the first 2-3 weeks of quitting, you may start to notice more positive results, such as:  Improved sense of smell and taste.  Decreased coughing and sore throat.  Slower heart rate.  Lower blood pressure.  Clearer skin.  The ability to breathe more easily.  Fewer sick days. Quitting smoking is very challenging for most people. Do not get discouraged if you are not successful the first time. Some people need to make many attempts to quit before they achieve long-term success. Do your best to stick to your quit plan, and talk with your health care provider if you have any questions or concerns.   This information is not intended to replace advice given to you by your health care provider. Make sure you discuss any questions you have with your health care provider.   Document Released: 11/01/2001 Document Revised: 03/24/2015 Document Reviewed: 03/24/2015 Elsevier Interactive Patient Education 2016 [employer].  Smoking Cessation, Tips for Success If  you are ready to quit smoking, congratulations! You have chosen to help yourself be healthier. Cigarettes bring nicotine, tar, carbon monoxide, and other irritants into your body. Your lungs, heart, and blood vessels will be able to work better without these poisons. There are many different ways to quit smoking. Nicotine gum, nicotine patches, a nicotine inhaler, or nicotine nasal spray can help with physical craving. Hypnosis, support groups, and medicines help break the habit of smoking. WHAT THINGS CAN I DO TO MAKE QUITTING EASIER?  Here are some tips to help you quit for good:  Pick a date when you will quit smoking completely. Tell all of your friends and family about your plan to quit on that date.  Do not try to slowly cut down on the number of cigarettes you are smoking. Pick a quit date and quit smoking completely starting on that day.  Throw away all cigarettes.   Clean and remove all ashtrays from your home, work, and car.  On a card, write down your reasons for quitting. Carry the card with you and read it when you get the urge to smoke.  Cleanse  your body of nicotine. Drink enough water and fluids to keep your urine clear or pale yellow. Do this after quitting to flush the nicotine from your body.  Learn to predict your moods. Do not let a bad situation be your excuse to have a cigarette. Some situations in your life might tempt you into wanting a cigarette.  Never have "just one" cigarette. It leads to wanting another and another. Remind yourself of your decision to quit.  Change habits associated with smoking. If you smoked while driving or when feeling stressed, try other activities to replace smoking. Stand up when drinking your coffee. Brush your teeth after eating. Sit in a different chair when you read the paper. Avoid alcohol while trying to quit, and try to drink fewer caffeinated beverages. Alcohol and caffeine may urge you to smoke.  Avoid foods and drinks that can  trigger a desire to smoke, such as sugary or spicy foods and alcohol.  Ask people who smoke not to smoke around you.  Have something planned to do right after eating or having a cup of coffee. For example, plan to take a walk or exercise.  Try a relaxation exercise to calm you down and decrease your stress. Remember, you may be tense and nervous for the first 2 weeks after you quit, but this will pass.  Find new activities to keep your hands busy. Play with a pen, coin, or rubber band. Doodle or draw things on paper.  Brush your teeth right after eating. This will help cut down on the craving for the taste of tobacco after meals. You can also try mouthwash.   Use oral substitutes in place of cigarettes. Try using lemon drops, carrots, cinnamon sticks, or chewing gum. Keep them handy so they are available when you have the urge to smoke.  When you have the urge to smoke, try deep breathing.  Designate your home as a nonsmoking area.  If you are a heavy smoker, ask your health care provider about a prescription for nicotine chewing gum. It can ease your withdrawal from nicotine.  Reward yourself. Set aside the cigarette money you save and buy yourself something nice.  Look for support from others. Join a support group or smoking cessation program. Ask someone at home or at work to help you with your plan to quit smoking.  Always ask yourself, "Do I need this cigarette or is this just a reflex?" Tell yourself, "Today, I choose not to smoke," or "I do not want to smoke." You are reminding yourself of your decision to quit.  Do not replace cigarette smoking with electronic cigarettes (commonly called e-cigarettes). The safety of e-cigarettes is unknown, and some may contain harmful chemicals.  If you relapse, do not give up! Plan ahead and think about what you will do the next time you get the urge to smoke. HOW WILL I FEEL WHEN I QUIT SMOKING? You may have symptoms of withdrawal because  your body is used to nicotine (the addictive substance in cigarettes). You may crave cigarettes, be irritable, feel very hungry, cough often, get headaches, or have difficulty concentrating. The withdrawal symptoms are only temporary. They are strongest when you first quit but will go away within 10-14 days. When withdrawal symptoms occur, stay in control. Think about your reasons for quitting. Remind yourself that these are signs that your body is healing and getting used to being without cigarettes. Remember that withdrawal symptoms are easier to treat than the major diseases that smoking  can cause.  Even after the withdrawal is over, expect periodic urges to smoke. However, these cravings are generally short lived and will go away whether you smoke or not. Do not smoke! WHAT RESOURCES ARE AVAILABLE TO HELP ME QUIT SMOKING? Your health care provider can direct you to community resources or hospitals for support, which may include:  Group support.  Education.  Hypnosis.  Therapy.   This information is not intended to replace advice given to you by your health care provider. Make sure you discuss any questions you have with your health care provider.   Document Released: 08/05/2004 Document Revised: 11/28/2014 Document Reviewed: 04/25/2013 Elsevier Interactive Patient Education [employer].

## 2016-04-25 NOTE — Progress Notes (Signed)
Subjective:    Patient ID: [name], female    DOB: Dec 09, 1975, 40 y.o.   MRN: [id number]  Chief Complaint  Patient presents with  . Establish Care    CPE     HPI:  [name] is a 40 y.o. female who presents today for an annual wellness visit.   1) Health Maintenance -   Diet - Averages about 1-2 meals per day consisting of fruits, vegetables, chicken and some beef; Caffeine intake 2-4 cups daily  Exercise - Dancing   2) Preventative Exams / Immunizations:  Dental -- Due for exam  Vision -- Up to date   Health Maintenance  Topic Date Due  . PAP SMEAR  10/26/1996  . TETANUS/TDAP  08/05/2016 (Originally 10/26/1994)  . INFLUENZA VACCINE  06/21/2016  . HIV Screening  Completed    There is no immunization history for the selected administration types on file for this patient.  Allergies  Allergen Reactions  . Fish Allergy Anaphylaxis and Shortness Of Breath  . Naprosyn [Naproxen] Anxiety and Other (See Comments)    Vaginal bleeding and anxiety  . Sulfa Antibiotics Anxiety and Other (See Comments)    Vaginal bleeding and anxiety     Outpatient Prescriptions Prior to Visit  Medication Sig Dispense Refill  . ibuprofen (ADVIL,MOTRIN) 600 MG tablet Take 1 tablet (600 mg total) by mouth every 6 (six) hours as needed. 30 tablet 0  . meclizine (ANTIVERT) 25 MG tablet Take 1 tablet (25 mg total) by mouth 3 (three) times daily as needed for dizziness. 30 tablet 0  . azithromycin (ZITHROMAX Z-PAK) 250 MG tablet 2 po day one, then 1 daily x 4 days (Patient not taking: Reported on 10/13/2015) 5 tablet 0  . ferrous sulfate 325 (65 FE) MG tablet Take 1 tablet (325 mg total) by mouth 2 (two) times daily with a meal. (Patient not taking: Reported on 08/19/2015) 60 tablet 2  . HYDROcodone-acetaminophen (NORCO/VICODIN) 5-325 MG tablet Take 1-2 tablets by mouth every 6 (six) hours as needed for severe pain. (Patient not taking: Reported on 04/12/2016) 10 tablet 0  .  HYDROcodone-homatropine (HYCODAN) 5-1.5 MG/5ML syrup Take 5 mLs by mouth every 6 (six) hours as needed for cough. (Patient not taking: Reported on 10/13/2015) 120 mL 0  . indomethacin (INDOCIN) 50 MG capsule Take 1 capsule (50 mg total) by mouth 3 (three) times daily as needed for moderate pain. (Patient not taking: Reported on 04/12/2016) 30 capsule 0  . levofloxacin (LEVAQUIN) 750 MG tablet Take 1 tablet (750 mg total) by mouth daily. (Patient not taking: Reported on 08/19/2015) 5 tablet 0  . nicotine (EQ NICOTINE) 21 mg/24hr patch Place 1 patch (21 mg total) onto the skin daily. Absolutely do not wear the patch and smoke cigarettes. (Patient not taking: Reported on 08/19/2015) 14 patch 0  . ondansetron (ZOFRAN ODT) 4 MG disintegrating tablet Take 1 tablet (4 mg total) by mouth every 8 (eight) hours as needed for nausea or vomiting. 20 tablet 0  . triamcinolone cream (KENALOG) 0.1 % Apply 1 application topically 2 (two) times daily. (Patient not taking: Reported on 10/13/2015) 30 g 0   No facility-administered medications prior to visit.     Past Medical History  Diagnosis Date  . Migraine   . Sarcoidosis (HCC)   . Vertigo      Past Surgical History  Procedure Laterality Date  . Cesarean section    . Knee surgery    . Cesarean section  Family History  Problem Relation Age of Onset  . Lupus Mother   . Healthy Father   . Heart failure Maternal Grandmother   . Diabetes Maternal Grandmother   . Hypertension Maternal Grandmother   . Diabetes Maternal Grandfather   . Diabetes Paternal Grandmother      Social History   Social History  . Marital Status: Single    Spouse Name: N/A  . Number of Children: 3  . Years of Education: 14   Occupational History  . Driver    Social History Main Topics  . Smoking status: Current Every Day Smoker -- 1.00 packs/day for 12 years    Types: Cigarettes  . Smokeless tobacco: Never Used  . Alcohol Use: Yes     Comment: Occasionally -  holidays  . Drug Use: No  . Sexual Activity: Not on file   Other Topics Concern  . Not on file   Social History Narrative   Fun: Dancing   Denies abuse and feels safe at home     Review of Systems  Constitutional: Denies fever, chills, fatigue, or significant weight gain/loss. HENT: Head: Denies headache or neck pain Ears: Denies changes in hearing, ringing in ears, earache, drainage Nose: Denies discharge, stuffiness, itching, nosebleed, sinus pain Throat: Denies sore throat, hoarseness, dry mouth, sores, thrush Eyes: Denies loss/changes in vision, pain, redness, blurry/double vision, flashing lights Cardiovascular: Denies chest pain/discomfort, tightness, palpitations, shortness of breath with activity, difficulty lying down, swelling, sudden awakening with shortness of breath Respiratory: Denies shortness of breath, cough, sputum production, wheezing Gastrointestinal: Denies dysphasia, heartburn, change in appetite, nausea, change in bowel habits, rectal bleeding, constipation, diarrhea, yellow skin or eyes Genitourinary: Denies frequency, urgency, burning/pain, blood in urine, incontinence, change in urinary strength. Musculoskeletal: Denies muscle/joint pain, stiffness, back pain, redness or swelling of joints, trauma Skin: Denies rashes, lumps, itching, dryness, color changes, or hair/nail changes Neurological: Denies dizziness, fainting, seizures, weakness, numbness, tingling, tremor Psychiatric - Denies nervousness, stress, depression or memory loss Endocrine: Denies heat or cold intolerance, sweating, frequent urination, excessive thirst, changes in appetite Hematologic: Denies ease of bruising or bleeding     Objective:     BP 118/80 mmHg  Pulse 65  Temp(Src) 97.7 F (36.5 C) (Oral)  Resp 16  Ht 5\' 4"  (1.626 m)  Wt 180 lb (81.647 kg)  BMI 30.88 kg/m2  SpO2 99%  LMP 06/22/2015 (Approximate) Nursing note and vital signs reviewed.  Physical Exam    Constitutional: She is oriented to person, place, and time. She appears well-developed and well-nourished.  HENT:  Head: Normocephalic.  Right Ear: Hearing, tympanic membrane, external ear and ear canal normal.  Left Ear: Hearing, tympanic membrane, external ear and ear canal normal.  Nose: Nose normal.  Mouth/Throat: Uvula is midline, oropharynx is clear and moist and mucous membranes are normal.  Eyes: Conjunctivae and EOM are normal. Pupils are equal, round, and reactive to light.  Neck: Neck supple. No JVD present. No tracheal deviation present. No thyromegaly present.  Cardiovascular: Normal rate, regular rhythm, normal heart sounds and intact distal pulses.   Pulmonary/Chest: Effort normal and breath sounds normal.  Abdominal: Soft. Bowel sounds are normal. She exhibits no distension and no mass. There is no tenderness. There is no rebound and no guarding.  Musculoskeletal: Normal range of motion. She exhibits no edema or tenderness.  Lymphadenopathy:    She has no cervical adenopathy.  Neurological: She is alert and oriented to person, place, and time. She has normal reflexes. No  cranial nerve deficit. She exhibits normal muscle tone. Coordination normal.  Skin: Skin is warm and dry.  Psychiatric: She has a normal mood and affect. Her behavior is normal. Judgment and thought content normal.       Assessment & Plan:   Problem List Items Addressed This Visit      Other   Routine general medical examination at a health care facility - Primary    1) Anticipatory Guidance: Discussed importance of wearing a seatbelt while driving and not texting while driving; changing batteries in smoke detector at least once annually; wearing suntan lotion when outside; eating a balanced and moderate diet; getting physical activity at least 30 minutes per day.  2) Immunizations / Screenings / Labs:  Declines tetanus. All other immunizations are up-to-date per recommendations. Obtain vitamin D for  vitamin D deficiency screening. Encouraged to complete dental exam independently. Due for a Pap smear encouraged to follow-up with gynecology. All other screenings are up-to-date per recommendations. Obtain CBC, CMET, and lipid profile.  Overall well exam with risk factors for cardiovascular disease including tobacco use and obesity. Recommend weight loss approximate 5-10% of current body weight through nutrition and physical activity.Recommend increasing physical activity to 30 minutes of moderate level activity daily. Encourage nutritional intake that focuses on nutrient dense foods and is moderate, varied, and balanced and is low in saturated fats and processed/sugary foods. Discussed tobacco use and risk for future cardiovascular and respiratory diseases. Patient is not ready to quit smoking at this time. Smoking cessation information provided and after visit summary. Follow-up prevention exam in 1 year. Follow-up office visit pending blood work if necessary.      Relevant Orders   CBC   Comprehensive metabolic panel   Lipid panel   VITAMIN D 25 Hydroxy (Vit-D Deficiency, Fractures)   Obesity   Tobacco abuse       I have discontinued Ms. Pennypacker's ferrous sulfate, levofloxacin, nicotine, triamcinolone cream, HYDROcodone-homatropine, azithromycin, indomethacin, HYDROcodone-acetaminophen, and ondansetron. I am also having her maintain her ibuprofen and meclizine.   Follow-up: Return if symptoms worsen or fail to improve.   Jeanine Luz, FNP

## 2016-07-28 ENCOUNTER — Other Ambulatory Visit: Payer: Self-pay | Admitting: Family

## 2016-07-28 ENCOUNTER — Other Ambulatory Visit (INDEPENDENT_AMBULATORY_CARE_PROVIDER_SITE_OTHER): Payer: BLUE CROSS/BLUE SHIELD

## 2016-07-28 DIAGNOSIS — E559 Vitamin D deficiency, unspecified: Secondary | ICD-10-CM

## 2016-07-28 DIAGNOSIS — Z0000 Encounter for general adult medical examination without abnormal findings: Secondary | ICD-10-CM | POA: Diagnosis not present

## 2016-07-28 LAB — CBC
HCT: 36.9 % (ref 36.0–46.0)
Hemoglobin: 12 g/dL (ref 12.0–15.0)
MCHC: 32.5 g/dL (ref 30.0–36.0)
MCV: 88.1 fl (ref 78.0–100.0)
PLATELETS: 247 10*3/uL (ref 150.0–400.0)
RBC: 4.19 Mil/uL (ref 3.87–5.11)
RDW: 13.4 % (ref 11.5–15.5)
WBC: 4.6 10*3/uL (ref 4.0–10.5)

## 2016-07-28 LAB — LIPID PANEL
CHOL/HDL RATIO: 2
Cholesterol: 143 mg/dL (ref 0–200)
HDL: 63.6 mg/dL (ref >39.00)
LDL CALC: 71 mg/dL (ref 0–99)
NONHDL: 79.77
TRIGLYCERIDES: 45 mg/dL (ref 0.0–149.0)
VLDL: 9 mg/dL (ref 0.0–40.0)

## 2016-07-28 LAB — COMPREHENSIVE METABOLIC PANEL
ALT: 7 U/L (ref 0–35)
AST: 11 U/L (ref 0–37)
Albumin: 3.6 g/dL (ref 3.5–5.2)
Alkaline Phosphatase: 70 U/L (ref 39–117)
BILIRUBIN TOTAL: 0.5 mg/dL (ref 0.2–1.2)
BUN: 7 mg/dL (ref 6–23)
CALCIUM: 8.6 mg/dL (ref 8.4–10.5)
CHLORIDE: 107 meq/L (ref 96–112)
CO2: 29 meq/L (ref 19–32)
CREATININE: 0.74 mg/dL (ref 0.40–1.20)
GFR: 111.36 mL/min (ref >60.00)
GLUCOSE: 92 mg/dL (ref 70–99)
Potassium: 4 mEq/L (ref 3.5–5.1)
Sodium: 138 mEq/L (ref 135–145)
Total Protein: 7.3 g/dL (ref 6.0–8.3)

## 2016-07-28 LAB — VITAMIN D 25 HYDROXY (VIT D DEFICIENCY, FRACTURES): VITD: 9.45 ng/mL — ABNORMAL LOW (ref 30.00–100.00)

## 2016-07-28 MED ORDER — VITAMIN D (ERGOCALCIFEROL) 1.25 MG (50000 UNIT) PO CAPS: 50000.0000 [IU] | ORAL_CAPSULE | ORAL | 0 refills | Status: DC

## 2016-11-25 ENCOUNTER — Encounter: Payer: Self-pay | Admitting: Family

## 2016-11-25 ENCOUNTER — Ambulatory Visit (INDEPENDENT_AMBULATORY_CARE_PROVIDER_SITE_OTHER): Payer: BLUE CROSS/BLUE SHIELD | Admitting: Family

## 2016-11-25 DIAGNOSIS — J0100 Acute maxillary sinusitis, unspecified: Secondary | ICD-10-CM

## 2016-11-25 DIAGNOSIS — J329 Chronic sinusitis, unspecified: Secondary | ICD-10-CM | POA: Insufficient documentation

## 2016-11-25 MED ORDER — AMOXICILLIN-POT CLAVULANATE 875-125 MG PO TABS: 1.0000 | ORAL_TABLET | Freq: Two times a day (BID) | ORAL | 0 refills | Status: DC

## 2016-11-25 MED ORDER — FLUCONAZOLE 150 MG PO TABS: 150.0000 mg | ORAL_TABLET | Freq: Once | ORAL | 0 refills | Status: AC

## 2016-11-25 MED ORDER — HYDROCODONE-HOMATROPINE 5-1.5 MG/5ML PO SYRP: 5.0000 mL | ORAL_SOLUTION | Freq: Three times a day (TID) | ORAL | 0 refills | Status: DC | PRN

## 2016-11-25 NOTE — Progress Notes (Signed)
Subjective:    Patient ID: [name], female    DOB: 23-Apr-1976, 41 y.o.   MRN: [id number]  Chief Complaint  Patient presents with  . Cough    cough and congestion, x3 months    HPI:  [name] is a 41 y.o. female who  has a past medical history of Migraine; Sarcoidosis (HCC); and Vertigo. and presents today for an acute office   This is a new problem. Associated symptoms of cough and congestion have been going on for approximately 3 weeks. No fevers. Modifying factors include Claratin, apple cider, lemons, and Mucinex which have helped with the symptoms but not effected the course. Course of the symptoms has stayed about the same since initial onset. No recent antibiotics. Neighbors are sick.    Allergies  Allergen Reactions  . Fish Allergy Anaphylaxis and Shortness Of Breath  . Naprosyn [Naproxen] Anxiety and Other (See Comments)    Vaginal bleeding and anxiety  . Sulfa Antibiotics Anxiety and Other (See Comments)    Vaginal bleeding and anxiety      Outpatient Medications Prior to Visit  Medication Sig Dispense Refill  . ibuprofen (ADVIL,MOTRIN) 600 MG tablet Take 1 tablet (600 mg total) by mouth every 6 (six) hours as needed. 30 tablet 0  . meclizine (ANTIVERT) 25 MG tablet Take 1 tablet (25 mg total) by mouth 3 (three) times daily as needed for dizziness. 30 tablet 0  . Vitamin D, Ergocalciferol, (DRISDOL) 50000 units CAPS capsule Take 1 capsule (50,000 Units total) by mouth every 7 (seven) days. 6 capsule 0   No facility-administered medications prior to visit.     Review of Systems  Constitutional: Negative for chills and fever.  HENT: Positive for congestion, sinus pressure and sore throat. Negative for ear pain and sinus pain.   Respiratory: Positive for cough. Negative for chest tightness and shortness of breath.   Neurological: Positive for headaches.      Objective:    BP 112/68 (BP Location: Left Arm, Patient Position: Sitting, Cuff Size:  Large)   Pulse 61   Temp 97.7 F (36.5 C) (Oral)   Resp 16   Ht 5\' 4"  (1.626 m)   Wt 186 lb (84.4 kg)   LMP 06/22/2015 (Approximate)   SpO2 98%   BMI 31.93 kg/m  Nursing note and vital signs reviewed.  Physical Exam  Constitutional: She is oriented to person, place, and time. She appears well-developed and well-nourished. No distress.  HENT:  Right Ear: Hearing, tympanic membrane, external ear and ear canal normal.  Left Ear: Hearing, tympanic membrane, external ear and ear canal normal.  Nose: Right sinus exhibits maxillary sinus tenderness and frontal sinus tenderness. Left sinus exhibits maxillary sinus tenderness and frontal sinus tenderness.  Mouth/Throat: Uvula is midline, oropharynx is clear and moist and mucous membranes are normal.  Neck: Neck supple.  Cardiovascular: Normal rate, regular rhythm, normal heart sounds and intact distal pulses.   Pulmonary/Chest: Effort normal and breath sounds normal.  Neurological: She is alert and oriented to person, place, and time.  Skin: Skin is warm and dry.  Psychiatric: She has a normal mood and affect. Her behavior is normal. Judgment and thought content normal.       Assessment & Plan:   Problem List Items Addressed This Visit      Respiratory   Sinusitis    Symptoms and exam consistent with bacterial sinusitis. Start Augmentin. Start Hycodan as needed for cough and sleep. Start Diflucan as needed  for post antibiotic candidiasis. Continue over-the-counter medications as needed for symptom relief and supportive care. Follow-up if symptoms worsen or do not improve.      Relevant Medications   amoxicillin-clavulanate (AUGMENTIN) 875-125 MG tablet   HYDROcodone-homatropine (HYCODAN) 5-1.5 MG/5ML syrup   fluconazole (DIFLUCAN) 150 MG tablet       I have discontinued [name]'s ibuprofen, meclizine, and Vitamin D (Ergocalciferol). I am also having her start on amoxicillin-clavulanate, HYDROcodone-homatropine, and  fluconazole.   Meds ordered this encounter  Medications  . amoxicillin-clavulanate (AUGMENTIN) 875-125 MG tablet    Sig: Take 1 tablet by mouth 2 (two) times daily.    Dispense:  14 tablet    Refill:  0    Order Specific Question:   Supervising Provider    Answer:   Hillard DankerRAWFORD, ELIZABETH A [[id number]]  . HYDROcodone-homatropine (HYCODAN) 5-1.5 MG/5ML syrup    Sig: Take 5 mLs by mouth every 8 (eight) hours as needed for cough.    Dispense:  120 mL    Refill:  0    Order Specific Question:   Supervising Provider    Answer:   Hillard DankerRAWFORD, ELIZABETH A [[id number]]  . fluconazole (DIFLUCAN) 150 MG tablet    Sig: Take 1 tablet (150 mg total) by mouth once.    Dispense:  1 tablet    Refill:  0    Order Specific Question:   Supervising Provider    Answer:   Hillard DankerRAWFORD, ELIZABETH A [[id number]]     Follow-up: Return if symptoms worsen or fail to improve.  Jeanine Luzalone, Gregory, FNP

## 2016-11-25 NOTE — Patient Instructions (Signed)
Thank you for choosing [facility].  SUMMARY AND INSTRUCTIONS:  Medication:  Your prescription(s) have been submitted to your pharmacy or been printed and provided for you. Please take as directed and contact our office if you believe you are having problem(s) with the medication(s) or have any questions.   Follow up:  If your symptoms worsen or fail to improve, please contact our office for further instruction, or in case of emergency go directly to the emergency room at the closest medical facility.    General Recommendations:    Please drink plenty of fluids.  Get plenty of rest   Sleep in humidified air  Use saline nasal sprays  Netti pot   OTC Medications:  Decongestants - helps relieve congestion   Flonase (generic fluticasone) or Nasacort (generic triamcinolone) - please make sure to use the "cross-over" technique at a 45 degree angle towards the opposite eye as opposed to straight up the nasal passageway.   Sudafed (generic pseudoephedrine - Note this is the one that is available behind the pharmacy counter); Products with phenylephrine (-PE) may also be used but is often not as effective as pseudoephedrine.   If you have HIGH BLOOD PRESSURE - Coricidin HBP; AVOID any product that is -D as this contains pseudoephedrine which may increase your blood pressure.  Afrin (oxymetazoline) every 6-8 hours for up to 3 days.   Allergies - helps relieve runny nose, itchy eyes and sneezing   Claritin (generic loratidine), Allegra (fexofenidine), or Zyrtec (generic cyrterizine) for runny nose. These medications should not cause drowsiness.  Note - Benadryl (generic diphenhydramine) may be used however may cause drowsiness  Cough -   Delsym or Robitussin (generic dextromethorphan)  Expectorants - helps loosen mucus to ease removal   Mucinex (generic guaifenesin) as directed on the package.  Headaches / General Aches   Tylenol (generic acetaminophen) - DO  NOT EXCEED 3 grams (3,000 mg) in a 24 hour time period  Advil/Motrin (generic ibuprofen)   Sore Throat -   Salt water gargle   Chloraseptic (generic benzocaine) spray or lozenges / Sucrets (generic dyclonine)    Sinusitis Sinusitis is redness, soreness, and inflammation of the paranasal sinuses. Paranasal sinuses are air pockets within the bones of your face (beneath the eyes, the middle of the forehead, or above the eyes). In healthy paranasal sinuses, mucus is able to drain out, and air is able to circulate through them by way of your nose. However, when your paranasal sinuses are inflamed, mucus and air can become trapped. This can allow bacteria and other germs to grow and cause infection. Sinusitis can develop quickly and last only a short time (acute) or continue over a long period (chronic). Sinusitis that lasts for more than 12 weeks is considered chronic.  CAUSES  Causes of sinusitis include:  Allergies.  Structural abnormalities, such as displacement of the cartilage that separates your nostrils (deviated septum), which can decrease the air flow through your nose and sinuses and affect sinus drainage.  Functional abnormalities, such as when the small hairs (cilia) that line your sinuses and help remove mucus do not work properly or are not present. SIGNS AND SYMPTOMS  Symptoms of acute and chronic sinusitis are the same. The primary symptoms are pain and pressure around the affected sinuses. Other symptoms include:  Upper toothache.  Earache.  Headache.  Bad breath.  Decreased sense of smell and taste.  A cough, which worsens when you are lying flat.  Fatigue.  Fever.  Thick drainage   from your nose, which often is green and may contain pus (purulent).  Swelling and warmth over the affected sinuses. DIAGNOSIS  Your health care provider will perform a physical exam. During the exam, your health care provider may:  Look in your nose for signs of abnormal growths  in your nostrils (nasal polyps).  Tap over the affected sinus to check for signs of infection.  View the inside of your sinuses (endoscopy) using an imaging device that has a light attached (endoscope). If your health care provider suspects that you have chronic sinusitis, one or more of the following tests may be recommended:  Allergy tests.  Nasal culture. A sample of mucus is taken from your nose, sent to a lab, and screened for bacteria.  Nasal cytology. A sample of mucus is taken from your nose and examined by your health care provider to determine if your sinusitis is related to an allergy. TREATMENT  Most cases of acute sinusitis are related to a viral infection and will resolve on their own within 10 days. Sometimes medicines are prescribed to help relieve symptoms (pain medicine, decongestants, nasal steroid sprays, or saline sprays).  However, for sinusitis related to a bacterial infection, your health care provider will prescribe antibiotic medicines. These are medicines that will help kill the bacteria causing the infection.  Rarely, sinusitis is caused by a fungal infection. In theses cases, your health care provider will prescribe antifungal medicine. For some cases of chronic sinusitis, surgery is needed. Generally, these are cases in which sinusitis recurs more than 3 times per year, despite other treatments. HOME CARE INSTRUCTIONS   Drink plenty of water. Water helps thin the mucus so your sinuses can drain more easily.  Use a humidifier.  Inhale steam 3 to 4 times a day (for example, sit in the bathroom with the shower running).  Apply a warm, moist washcloth to your face 3 to 4 times a day, or as directed by your health care provider.  Use saline nasal sprays to help moisten and clean your sinuses.  Take medicines only as directed by your health care provider.  If you were prescribed either an antibiotic or antifungal medicine, finish it all even if you start to feel  better. SEEK IMMEDIATE MEDICAL CARE IF:  You have increasing pain or severe headaches.  You have nausea, vomiting, or drowsiness.  You have swelling around your face.  You have vision problems.  You have a stiff neck.  You have difficulty breathing. MAKE SURE YOU:   Understand these instructions.  Will watch your condition.  Will get help right away if you are not doing well or get worse. Document Released: 11/07/2005 Document Revised: 03/24/2014 Document Reviewed: 11/22/2011 ExitCare Patient Information 2015 [employer]. This information is not intended to replace advice given to you by your health care provider. Make sure you discuss any questions you have with your health care provider.   

## 2016-11-25 NOTE — Assessment & Plan Note (Signed)
Symptoms and exam consistent with bacterial sinusitis. Start Augmentin. Start Hycodan as needed for cough and sleep. Start Diflucan as needed for post antibiotic candidiasis. Continue over-the-counter medications as needed for symptom relief and supportive care. Follow-up if symptoms worsen or do not improve.

## 2016-11-30 ENCOUNTER — Encounter: Payer: Self-pay | Admitting: Family

## 2016-11-30 MED ORDER — ONDANSETRON 4 MG PO TBDP: 4.0000 mg | ORAL_TABLET | Freq: Three times a day (TID) | ORAL | 0 refills | Status: DC | PRN

## 2017-11-19 ENCOUNTER — Emergency Department (HOSPITAL_COMMUNITY): Payer: BLUE CROSS/BLUE SHIELD

## 2017-11-19 ENCOUNTER — Encounter (HOSPITAL_COMMUNITY): Payer: Self-pay | Admitting: Emergency Medicine

## 2017-11-19 ENCOUNTER — Emergency Department (HOSPITAL_COMMUNITY)
Admission: EM | Admit: 2017-11-19 | Discharge: 2017-11-19 | Disposition: A | Discharge status: Home / Self Care | Payer: BLUE CROSS/BLUE SHIELD | Attending: Emergency Medicine | Admitting: Emergency Medicine

## 2017-11-19 DIAGNOSIS — J0191 Acute recurrent sinusitis, unspecified: Secondary | ICD-10-CM | POA: Insufficient documentation

## 2017-11-19 DIAGNOSIS — J019 Acute sinusitis, unspecified: Secondary | ICD-10-CM

## 2017-11-19 DIAGNOSIS — R05 Cough: Secondary | ICD-10-CM | POA: Diagnosis not present

## 2017-11-19 DIAGNOSIS — F17210 Nicotine dependence, cigarettes, uncomplicated: Secondary | ICD-10-CM | POA: Insufficient documentation

## 2017-11-19 DIAGNOSIS — R0981 Nasal congestion: Secondary | ICD-10-CM | POA: Diagnosis not present

## 2017-11-19 MED ORDER — AMOXICILLIN-POT CLAVULANATE 875-125 MG PO TABS: 1.0000 | ORAL_TABLET | Freq: Two times a day (BID) | ORAL | 0 refills | Status: DC

## 2017-11-19 MED ORDER — BENZONATATE 100 MG PO CAPS
100.0000 mg | ORAL_CAPSULE | Freq: Once | ORAL | Status: AC
  Administered 2017-11-19: 100 mg via ORAL
  Filled 2017-11-19: qty 1

## 2017-11-19 MED ORDER — BENZONATATE 100 MG PO CAPS: 100.0000 mg | ORAL_CAPSULE | Freq: Three times a day (TID) | ORAL | 0 refills | Status: DC

## 2017-11-19 MED ORDER — IBUPROFEN 400 MG PO TABS
600.0000 mg | ORAL_TABLET | Freq: Once | ORAL | Status: AC
  Administered 2017-11-19: 600 mg via ORAL
  Filled 2017-11-19: qty 1

## 2017-11-19 NOTE — ED Triage Notes (Signed)
Reports fighting "sinus" infection for the last three weeks.  Taking OTC sinus medication, nasal spray, and tylenol.  Also endorses having a headache at this time.  Last dose at 1pm.

## 2017-11-19 NOTE — Discharge Instructions (Signed)
Complete course of antibiotics as directed, even if symptoms completely resolved.  You can also continue to treat symptoms supportively as you have been doing at home, you may use Tessalon Perles for cough.  Continue to drink lots of fluids.  If symptoms are not improving please follow-up with your primary doctor next week.  If you have worsening symptoms despite antibiotics, develop persistent headache, fevers or chills, neck pain or stiffness please return to the ED for reevaluation.

## 2017-11-19 NOTE — ED Notes (Signed)
Patient left at this time with all belongings. Given dose of benzonatate prior to discharge at patient's request.

## 2017-11-19 NOTE — ED Provider Notes (Signed)
[facility] Provider [facility]   CSN: [id number] Arrival date & time: 11/19/17  1956     History   Chief Complaint Chief Complaint  Patient presents with  . URI    HPI Carrie RuddleLatoya D Asare is a 41 y.o. female.  [name] is a 41 y.o. Female presents complaining of 2-3 weeks of nasal congestion, rhinorrhea, chills and productive cough.  Patient reports despite taking over-the-counter sinus medication, nasal sprays and Tylenol symptoms have continued to worsen.  Patient reports purulent nasal drainage, sinus pressure and pain and frontal headache.  Reports some associated scratchy throat, and ear pressure.  Subjective fevers and chills.  Patient reports cough developed over the past week and is productive of mucus.  No hemoptysis.  Patient reports some intermittent nausea, no episodes of vomiting or diarrhea, denies abdominal pain.  Denies any associated vision changes, neck pain or stiffness or dizziness with headaches.      Past Medical History:  Diagnosis Date  . Migraine   . Sarcoidosis   . Vertigo     Patient Active Problem List   Diagnosis Date Noted  . Sinusitis 11/25/2016  . Vitamin D deficiency 07/28/2016  . Routine general medical examination at a health care facility 04/25/2016  . Obesity 04/25/2016  . Tobacco abuse 04/25/2016  . Community acquired pneumonia 09/28/2014  . Anemia 09/28/2014  . Menorrhagia 09/28/2014  . Thrombocytopenia (HCC) 09/28/2014  . CAP (community acquired pneumonia) 09/28/2014    Past Surgical History:  Procedure Laterality Date  . CESAREAN SECTION    . CESAREAN SECTION    . KNEE SURGERY      OB History    No data available       Home Medications    Prior to Admission medications   Medication Sig Start Date End Date Taking? Authorizing Provider  amoxicillin-clavulanate (AUGMENTIN) 875-125 MG tablet Take 1 tablet by mouth 2 (two) times daily. 11/25/16   Veryl Speakalone, Gregory D, FNP    amoxicillin-clavulanate (AUGMENTIN) 875-125 MG tablet Take 1 tablet by mouth 2 (two) times daily. One po bid x 7 days 11/19/17   Dartha LodgeFord, Renae Mottley N, PA-C  benzonatate (TESSALON) 100 MG capsule Take 1 capsule (100 mg total) by mouth every 8 (eight) hours. 11/19/17   Dartha LodgeFord, Ashar Lewinski N, PA-C  HYDROcodone-homatropine (HYCODAN) 5-1.5 MG/5ML syrup Take 5 mLs by mouth every 8 (eight) hours as needed for cough. 11/25/16   Veryl Speakalone, Gregory D, FNP  ondansetron (ZOFRAN ODT) 4 MG disintegrating tablet Take 1 tablet (4 mg total) by mouth every 8 (eight) hours as needed for nausea or vomiting. 11/30/16   Veryl Speakalone, Gregory D, FNP    Family History Family History  Problem Relation Age of Onset  . Lupus Mother   . Healthy Father   . Heart failure Maternal Grandmother   . Diabetes Maternal Grandmother   . Hypertension Maternal Grandmother   . Diabetes Maternal Grandfather   . Diabetes Paternal Grandmother     Social History Social History   Tobacco Use  . Smoking status: Current Every Day Smoker    Packs/day: 1.00    Years: 12.00    Pack years: 12.00    Types: Cigarettes  . Smokeless tobacco: Never Used  Substance Use Topics  . Alcohol use: Yes    Comment: Occasionally - holidays  . Drug use: No     Allergies   Fish allergy; Naprosyn [naproxen]; and Sulfa antibiotics   Review of Systems Review of Systems  Constitutional: Positive  for chills and fever.  HENT: Positive for congestion, ear pain, postnasal drip, rhinorrhea, sinus pressure, sinus pain and sore throat. Negative for ear discharge, tinnitus and trouble swallowing.   Eyes: Negative for discharge, redness and itching.  Respiratory: Positive for cough. Negative for chest tightness, shortness of breath, wheezing and stridor.   Cardiovascular: Negative for chest pain, palpitations and leg swelling.  Gastrointestinal: Positive for nausea. Negative for abdominal pain, diarrhea and vomiting.  Genitourinary: Negative for dysuria.  Musculoskeletal:  Negative for arthralgias and myalgias.  Skin: Negative for rash.  Neurological: Positive for headaches. Negative for dizziness, weakness, light-headedness and numbness.     Physical Exam Updated Vital Signs BP 139/90 (BP Location: Right Arm)   Pulse 74   Temp 98.7 F (37.1 C) (Oral)   Resp 18   Ht 5\' 5"  (1.651 m)   Wt 81.6 kg (180 lb)   LMP 10/22/2015   SpO2 100%   BMI 29.95 kg/m   Physical Exam  Constitutional: She is oriented to person, place, and time. She appears well-developed and well-nourished. No distress.  HENT:  Head: Normocephalic and atraumatic.  TMs clear with good landmarks, moderate nasal mucosa edema with clear rhinorrhea, posterior oropharynx clear and moist, with some erythema, no edema or exudates, uvula midline, no trismus  Eyes: EOM are normal. Pupils are equal, round, and reactive to light. Right eye exhibits no discharge. Left eye exhibits no discharge.  Neck: Normal range of motion. Neck supple.  No rigidity  Cardiovascular: Normal rate, regular rhythm and normal heart sounds.  Pulmonary/Chest: Effort normal and breath sounds normal. No stridor. No respiratory distress. She has no wheezes. She has no rales.  Abdominal: Soft. Bowel sounds are normal. She exhibits no distension. There is no tenderness.  Musculoskeletal: She exhibits no edema or deformity.  Lymphadenopathy:    She has no cervical adenopathy.  Neurological: She is alert and oriented to person, place, and time. Coordination normal.  Speech is clear, able to follow commands CN III-XII intact Normal strength in upper and lower extremities bilaterally including dorsiflexion and plantar flexion, strong and equal grip strength Sensation normal to light and sharp touch Moves extremities without ataxia, coordination intact  Skin: Skin is warm and dry. Capillary refill takes less than 2 seconds. She is not diaphoretic.  Psychiatric: She has a normal mood and affect. Her behavior is normal.    Nursing note and vitals reviewed.    ED Treatments / Results  Labs (all labs ordered are listed, but only abnormal results are displayed) Labs Reviewed - No data to display  EKG  EKG Interpretation None       Radiology Dg Chest 2 View  Result Date: 11/19/2017 CLINICAL DATA:  Productive cough for 1 week.  Chills, fever, nausea. EXAM: CHEST  2 VIEW COMPARISON:  09/06/2015 FINDINGS: The heart size and mediastinal contours are within normal limits. Both lungs are clear. The visualized skeletal structures are unremarkable. IMPRESSION: No active cardiopulmonary disease. Electronically Signed   By: Burman NievesWilliam  Stevens M.D.   On: 11/19/2017 23:06    Procedures Procedures (including critical care time)  Medications Ordered in ED Medications  ibuprofen (ADVIL,MOTRIN) tablet 600 mg (600 mg Oral Given 11/19/17 2308)  benzonatate (TESSALON) capsule 100 mg (100 mg Oral Given 11/19/17 2354)     Initial Impression / Assessment and Plan / ED Course  I have reviewed the triage vital signs and the nursing notes.  Pertinent labs & imaging results that were available during my care of the  patient were reviewed by me and considered in my medical decision making (see chart for details).  Patient complaining of symptoms of sinusitis.    Severe symptoms have been present for greater than 10 days with purulent nasal discharge and maxillary sinus pain.  Concern for acute bacterial rhinosinusitis.  Also with productive cough, chest x-ray negative for pneumonia. Patient discharged with Augmentin as well as Tessalon Perles for cough.  Discussed common side effects of Augmentin, recommend taking with food and using probiotics..  Instructions given for warm saline nasal wash and recommendations for follow-up with primary care physician.  Pt expresses understanding and agrees with plan.   Final Clinical Impressions(s) / ED Diagnoses   Final diagnoses:  Acute non-recurrent sinusitis, unspecified location     ED Discharge Orders        Ordered    amoxicillin-clavulanate (AUGMENTIN) 875-125 MG tablet  2 times daily     11/19/17 2331    benzonatate (TESSALON) 100 MG capsule  Every 8 hours     11/19/17 2331       Dartha Lodge, PA-C 11/20/17 0151    Eber Hong, MD 11/20/17 [phone]

## 2017-12-11 ENCOUNTER — Other Ambulatory Visit: Payer: Self-pay | Admitting: Orthopedic Surgery

## 2017-12-11 DIAGNOSIS — S93422A Sprain of deltoid ligament of left ankle, initial encounter: Secondary | ICD-10-CM

## 2017-12-12 ENCOUNTER — Ambulatory Visit
Admission: RE | Admit: 2017-12-12 | Discharge: 2017-12-12 | Disposition: A | Discharge status: Home / Self Care | Payer: Worker's Compensation | Source: Ambulatory Visit | Attending: Orthopedic Surgery | Admitting: Orthopedic Surgery

## 2017-12-12 DIAGNOSIS — S93422A Sprain of deltoid ligament of left ankle, initial encounter: Secondary | ICD-10-CM

## 2018-02-10 ENCOUNTER — Ambulatory Visit: Payer: Self-pay | Admitting: Physician Assistant

## 2018-02-27 ENCOUNTER — Ambulatory Visit: Payer: Self-pay | Admitting: Physician Assistant

## 2018-04-09 ENCOUNTER — Encounter: Payer: Self-pay | Admitting: Nurse Practitioner

## 2018-04-09 ENCOUNTER — Ambulatory Visit (INDEPENDENT_AMBULATORY_CARE_PROVIDER_SITE_OTHER): Payer: 59 | Admitting: Nurse Practitioner

## 2018-04-09 ENCOUNTER — Other Ambulatory Visit (INDEPENDENT_AMBULATORY_CARE_PROVIDER_SITE_OTHER): Payer: 59

## 2018-04-09 VITALS — BP 114/80 | HR 68 | Temp 97.9°F | Resp 16 | Ht 65.0 in | Wt 191.0 lb

## 2018-04-09 DIAGNOSIS — M25572 Pain in left ankle and joints of left foot: Secondary | ICD-10-CM | POA: Diagnosis not present

## 2018-04-09 DIAGNOSIS — N926 Irregular menstruation, unspecified: Secondary | ICD-10-CM

## 2018-04-09 DIAGNOSIS — D649 Anemia, unspecified: Secondary | ICD-10-CM

## 2018-04-09 DIAGNOSIS — R5383 Other fatigue: Secondary | ICD-10-CM | POA: Diagnosis not present

## 2018-04-09 DIAGNOSIS — N63 Unspecified lump in unspecified breast: Secondary | ICD-10-CM | POA: Diagnosis not present

## 2018-04-09 LAB — CBC WITH DIFFERENTIAL/PLATELET
BASOS ABS: 0.1 10*3/uL (ref 0.0–0.1)
BASOS PCT: 1.5 % (ref 0.0–3.0)
EOS ABS: 0 10*3/uL (ref 0.0–0.7)
Eosinophils Relative: 0.7 % (ref 0.0–5.0)
HEMATOCRIT: 39.1 % (ref 36.0–46.0)
HEMOGLOBIN: 12.7 g/dL (ref 12.0–15.0)
LYMPHS PCT: 33.5 % (ref 12.0–46.0)
Lymphs Abs: 1.2 10*3/uL (ref 0.7–4.0)
MCHC: 32.5 g/dL (ref 30.0–36.0)
MCV: 89.1 fl (ref 78.0–100.0)
MONO ABS: 0.4 10*3/uL (ref 0.1–1.0)
Monocytes Relative: 12.4 % — ABNORMAL HIGH (ref 3.0–12.0)
Neutro Abs: 1.8 10*3/uL (ref 1.4–7.7)
Neutrophils Relative %: 51.9 % (ref 43.0–77.0)
Platelets: 258 10*3/uL (ref 150.0–400.0)
RBC: 4.39 Mil/uL (ref 3.87–5.11)
RDW: 12.7 % (ref 11.5–15.5)
WBC: 3.4 10*3/uL — AB (ref 4.0–10.5)

## 2018-04-09 LAB — IBC PANEL
IRON: 157 ug/dL — AB (ref 42–145)
SATURATION RATIOS: 42.6 % (ref 20.0–50.0)
TRANSFERRIN: 263 mg/dL (ref 212.0–360.0)

## 2018-04-09 LAB — COMPREHENSIVE METABOLIC PANEL
ALT: 7 U/L (ref 0–35)
AST: 11 U/L (ref 0–37)
Albumin: 3.8 g/dL (ref 3.5–5.2)
Alkaline Phosphatase: 79 U/L (ref 39–117)
BUN: 11 mg/dL (ref 6–23)
CALCIUM: 9.4 mg/dL (ref 8.4–10.5)
CHLORIDE: 103 meq/L (ref 96–112)
CO2: 27 meq/L (ref 19–32)
CREATININE: 0.75 mg/dL (ref 0.40–1.20)
GFR: 108.75 mL/min (ref >60.00)
Glucose, Bld: 104 mg/dL — ABNORMAL HIGH (ref 70–99)
POTASSIUM: 3.9 meq/L (ref 3.5–5.1)
SODIUM: 136 meq/L (ref 135–145)
Total Bilirubin: 0.4 mg/dL (ref 0.2–1.2)
Total Protein: 8.1 g/dL (ref 6.0–8.3)

## 2018-04-09 LAB — IRON: IRON: 157 ug/dL — AB (ref 42–145)

## 2018-04-09 LAB — TSH: TSH: 1.01 u[IU]/mL (ref 0.35–4.50)

## 2018-04-09 NOTE — Patient Instructions (Addendum)
Please head downstairs for lab work/x-rays. If any of your test results are critically abnormal, you will be contacted right away. Your results may be released to your MyChart for viewing before I am able to provide you with my response. I will contact you within a week about your test results and any recommendations for abnormalities.  I have placed a referral to gynecology. Our office will call you to schedule this appointment. You should hear from our office in 7-10 days.  I have placed an order for a mammogram. Our office will call you to schedule this appointment. You should hear from our office in 7-10 days.  Please schedule a follow up appointment for further evaluation of your ankle pain here with Dr Katrinka Blazing or Dr Jordan Likes, our sports medicine providers

## 2018-04-09 NOTE — Assessment & Plan Note (Addendum)
Update labs F/U with further recommendations pending lab results Return precautions discussed during OV - CBC with Differential/Platelet; Future - IBC panel; Future - Iron; Future

## 2018-04-09 NOTE — Progress Notes (Signed)
Name: [name]   MRN: [id number]    DOB: 1975/12/03   Date:04/09/2018       Progress Note  Subjective  Chief Complaint  Chief Complaint  Patient presents with  . Establish Care    left ankle checked out, breast lump on right breast that she noticed since april    HPI Ms [name] is establishing care with me today as a transfer from a prior provider in our clinic. She is not maintained on any daily medications. She has recently been following with orthopedic surgery for left ankle sprain. She is requesting evaluation of left ankle pain, a breast lump, and anemia today.  Left Ankle pain- This is not a new problem She says she twisted her left ankle getting out of her truck back in November. She has been having left ankle pain since  The pain is a shooting pain in her left ankle and radiating across her left foot. The pain occurs most days Sometimes her ankle feels numb and tingly. She reports edema. She denies falls, weakness, discoloration. She has been working with [facility] for the injury, wearing ankle brace, taking meloxicam, PT but not getting better and would like second evaluation  Breast lump- This is a new problem. She has noticed a lump in right upper outer breast since April. The lump is not painful She denies any erythema, nipple discharge Sometimes her breasts feel sore and tender. She denies personal or family history of breast cancer. She has never had a mammogram.  Anemia- She has a history of anemia She has been given iron tranfusions in the past for anemia but not maintained on daily medications. She reports recent fatigue. She denies dizziness, syncope, chest pain, shortness of breath, hematemesis, rectal bleeding Having periods about 1-2 times per year now and they are typically heavy- soaks through a pad per 2 hours. Her GYN moved recently and she has not re-established care with a new GYN.  Patient Active Problem List   Diagnosis Date Noted  .  Sinusitis 11/25/2016  . Vitamin D deficiency 07/28/2016  . Routine general medical examination at a health care facility 04/25/2016  . Obesity 04/25/2016  . Tobacco abuse 04/25/2016  . Community acquired pneumonia 09/28/2014  . Anemia 09/28/2014  . Menorrhagia 09/28/2014  . Thrombocytopenia (HCC) 09/28/2014  . CAP (community acquired pneumonia) 09/28/2014    Past Surgical History:  Procedure Laterality Date  . CESAREAN SECTION    . CESAREAN SECTION    . KNEE SURGERY      Family History  Problem Relation Age of Onset  . Lupus Mother   . Healthy Father   . Heart failure Maternal Grandmother   . Diabetes Maternal Grandmother   . Hypertension Maternal Grandmother   . Diabetes Maternal Grandfather   . Diabetes Paternal Grandmother     Social History   Socioeconomic History  . Marital status: Married    Spouse name: Not on file  . Number of children: 3  . Years of education: 29  . Highest education level: Not on file  Occupational History  . Occupation: Runner, broadcasting/film/video  . Financial resource strain: Not on file  . Food insecurity:    Worry: Not on file    Inability: Not on file  . Transportation needs:    Medical: Not on file    Non-medical: Not on file  Tobacco Use  . Smoking status: Current Every Day Smoker    Packs/day: 1.00    Years:  12.00    Pack years: 12.00    Types: Cigarettes  . Smokeless tobacco: Never Used  Substance and Sexual Activity  . Alcohol use: Yes    Comment: Occasionally - holidays  . Drug use: No  . Sexual activity: Not on file  Lifestyle  . Physical activity:    Days per week: Not on file    Minutes per session: Not on file  . Stress: Not on file  Relationships  . Social connections:    Talks on phone: Not on file    Gets together: Not on file    Attends religious service: Not on file    Active member of club or organization: Not on file    Attends meetings of clubs or organizations: Not on file    Relationship status: Not  on file  . Intimate partner violence:    Fear of current or ex partner: Not on file    Emotionally abused: Not on file    Physically abused: Not on file    Forced sexual activity: Not on file  Other Topics Concern  . Not on file  Social History Narrative   Fun: Dancing   Denies abuse and feels safe at home    No current outpatient medications on file.  Allergies  Allergen Reactions  . Fish Allergy Anaphylaxis and Shortness Of Breath  . Naprosyn [Naproxen] Anxiety and Other (See Comments)    Vaginal bleeding and anxiety  . Sulfa Antibiotics Anxiety and Other (See Comments)    Vaginal bleeding and anxiety     ROS SEe HPI  Objective  Vitals:   04/09/18 1019  BP: 114/80  Pulse: 68  Resp: 16  Temp: 97.9 F (36.6 C)  TempSrc: Oral  SpO2: 97%  Weight: 191 lb (86.6 kg)  Height:  (1.651 m)   Body mass index is 31.78 kg/m.  Physical Exam Vital signs reviewed. Constitutional: Patient appears well-developed and well-nourished. No distress.  HENT: Head: Normocephalic and atraumatic.Nose: Nose normal. Mouth/Throat: Oropharynx is clear and moist. No oropharyngeal exudate.  Eyes: Conjunctivae and EOM are normal. Pupils are equal, round, and reactive to light. No scleral icterus.  Neck: Normal range of motion. Neck supple. Cardiovascular: Normal rate, regular rhythm and normal heart sounds.  No murmur heard. Distal pulses intact. Pulmonary/Chest: Effort normal and breath sounds normal. No respiratory distress. Chest wall: Bilateral breasts without tenderness, skin change, nipple discharge, mass. Musculoskeletal: Normal range of motion. No gross deformities Neurological: She is alert and oriented to person, place, and time. No cranial nerve deficit. Coordination, balance, strength, speech and gait are normal.  Skin: Skin is warm and dry. No rash noted. No erythema.  Psychiatric: Patient has a normal mood and affect. behavior is normal. Judgment and thought content  normal.   Assessment & Plan F/U TBD pending lab results  Breast lump Patient reports palpable lump at 9-10:00 on right breast, no palpated on exam today Mammogram ordered - MM Digital Diagnostic Bilat; Future - US BREAST LTD UNI RIGHT INC AXILLA; Future  Left ankle pain, unspecified chronicity Referral to sports medicine for further evaluation and managment  Abnormal menstruation Discussed referral to GYN for further evaluation of irregular menstruation as well as routine annual womens health care and she is agreeable Return precautions discussed during OV - Ambulatory referral to Gynecology - CBC with Differential/Platelet; Future - IBC panel; Future - Iron; Future  Other fatigue Update labs F/U with further recommendations pending lab results - CBC with Differential/Platelet; Future -  IBC panel; Future - Iron; Future - Comprehensive metabolic panel; Future - TSH; Future

## 2018-04-13 ENCOUNTER — Encounter: Payer: Self-pay | Admitting: Family Medicine

## 2018-04-13 ENCOUNTER — Ambulatory Visit (INDEPENDENT_AMBULATORY_CARE_PROVIDER_SITE_OTHER): Payer: 59 | Admitting: Family Medicine

## 2018-04-13 VITALS — BP 138/86 | HR 78 | Temp 98.0°F | Ht 65.0 in | Wt 191.0 lb

## 2018-04-13 DIAGNOSIS — M25572 Pain in left ankle and joints of left foot: Secondary | ICD-10-CM | POA: Diagnosis not present

## 2018-04-13 DIAGNOSIS — G8929 Other chronic pain: Secondary | ICD-10-CM

## 2018-04-13 MED ORDER — DICLOFENAC SODIUM 2 % TD SOLN: 1.0000 "application " | Freq: Two times a day (BID) | TRANSDERMAL | 3 refills | Status: DC

## 2018-04-13 NOTE — Progress Notes (Signed)
[name] - 42 y.o. female MRN [id number]  Date of birth: 05/29/1976  SUBJECTIVE:  Including CC & ROS.  Chief Complaint  Patient presents with  . Left foot pain    [name] is a 42 y.o. female that is presenting with left foot/ankle pain. She turned her ankle in November getting out of her truck.  Pain is located at her ankle and worse she is standing. Pain is throbbing in nature when she roatates her ankle. She has been wearing an ASO brace daily for support. She completed physical therapy in April. Admits to swelling and tenderness. She works at [employer], she is on her feet daily. Has been taking mobic with limited improvement.   Independent review of the left ankle MRI from 1/22 shows no tear.   Review of the left ankle xray from 09/25/17 shows no fracture    Review of Systems  Constitutional: Negative for fever.  HENT: Negative for congestion.   Respiratory: Negative for cough.   Cardiovascular: Negative for chest pain.  Gastrointestinal: Negative for abdominal pain.  Musculoskeletal: Negative for joint swelling.  Skin: Negative for color change.  Neurological: Negative for weakness.  Hematological: Negative for adenopathy.  Psychiatric/Behavioral: Negative for agitation.    HISTORY: Past Medical, Surgical, Social, and Family History Reviewed & Updated per EMR.   Pertinent Historical Findings include:  Past Medical History:  Diagnosis Date  . Migraine   . Sarcoidosis   . Vertigo     Past Surgical History:  Procedure Laterality Date  . CESAREAN SECTION    . CESAREAN SECTION    . KNEE SURGERY      Allergies  Allergen Reactions  . Fish Allergy Anaphylaxis and Shortness Of Breath  . Naprosyn [Naproxen] Anxiety and Other (See Comments)    Vaginal bleeding and anxiety  . Sulfa Antibiotics Anxiety and Other (See Comments)    Vaginal bleeding and anxiety    Family History  Problem Relation Age of Onset  . Lupus Mother   . Healthy Father   .  Heart failure Maternal Grandmother   . Diabetes Maternal Grandmother   . Hypertension Maternal Grandmother   . Diabetes Maternal Grandfather   . Diabetes Paternal Grandmother      Social History   Socioeconomic History  . Marital status: Married    Spouse name: Not on file  . Number of children: 3  . Years of education: 32  . Highest education level: Not on file  Occupational History  . Occupation: Runner, broadcasting/film/video  . Financial resource strain: Not on file  . Food insecurity:    Worry: Not on file    Inability: Not on file  . Transportation needs:    Medical: Not on file    Non-medical: Not on file  Tobacco Use  . Smoking status: Current Every Day Smoker    Packs/day: 1.00    Years: 12.00    Pack years: 12.00    Types: Cigarettes  . Smokeless tobacco: Never Used  Substance and Sexual Activity  . Alcohol use: Yes    Comment: Occasionally - holidays  . Drug use: No  . Sexual activity: Not on file  Lifestyle  . Physical activity:    Days per week: Not on file    Minutes per session: Not on file  . Stress: Not on file  Relationships  . Social connections:    Talks on phone: Not on file    Gets together: Not on file  Attends religious service: Not on file    Active member of club or organization: Not on file    Attends meetings of clubs or organizations: Not on file    Relationship status: Not on file  . Intimate partner violence:    Fear of current or ex partner: Not on file    Emotionally abused: Not on file    Physically abused: Not on file    Forced sexual activity: Not on file  Other Topics Concern  . Not on file  Social History Narrative   Fun: Dancing   Denies abuse and feels safe at home     PHYSICAL EXAM:  VS: BP 138/86 (BP Location: Left Arm, Patient Position: Sitting, Cuff Size: Small)   Pulse 78   Temp 98 F (36.7 C) (Oral)   Ht  (1.651 m)   Wt 191 lb (86.6 kg)   LMP 10/22/2015   SpO2 98%   BMI 31.78 kg/m  Physical  Exam Gen: NAD, alert, cooperative with exam, well-appearing ENT: normal lips, normal nasal mucosa,  Eye: normal EOM, normal conjunctiva and lids CV:  no edema, +2 pedal pulses   Resp: no accessory muscle use, non-labored,  Skin: no rashes, no areas of induration  Neuro: normal tone, normal sensation to touch Psych:  normal insight, alert and oriented MSK:  Left Ankle & Foot: Pes planus b/l  Normal posterior tib function  No visible swelling, ecchymosis, erythema, ulcers, calluses, blister No tenderness on lateral and medial malleolus No sign of peroneal tendon subluxations or tenderness to palpation Full in plantarflexion, dorsiflexion, inversion, and eversion of the foot; flexion and extension of the toes Strength: 5/5 in all directions. Sensation: intact Vascular: intact w/ dorsalis pedis & posterior tibialis pulses 2+ Negative Anterior drawer test neurovascularly intact   Limited ultrasound: left ankle:  Normal appearing posterior tib  Normal appearing achilles   Summary: normal exam   Ultrasound and interpretation by Clare Gandy, MD   ASSESSMENT & PLAN:   Chronic pain of left ankle History suggests ankle sprain. Doesn't appear to be chronic instability. Possible that the subtalar joint may be subluxing. Normal appearing MRI.  - could try subtalar manipulation  - pennsaid  - advised to wear orthotics in work shoes. Or may need to try custom orthotics  - may need to try ongoing subtalar manipulation.

## 2018-04-13 NOTE — Assessment & Plan Note (Signed)
History suggests ankle sprain. Doesn't appear to be chronic instability. Possible that the subtalar joint may be subluxing. Normal appearing MRI.  - could try subtalar manipulation  - pennsaid  - advised to wear orthotics in work shoes. Or may need to try custom orthotics  - may need to try ongoing subtalar manipulation.

## 2018-04-13 NOTE — Patient Instructions (Signed)
Please try to wear your orthotics at work  Please try the cream on the area that hurts  Please follow up in three weeks

## 2018-04-18 ENCOUNTER — Other Ambulatory Visit: Payer: 59

## 2018-04-18 ENCOUNTER — Ambulatory Visit (INDEPENDENT_AMBULATORY_CARE_PROVIDER_SITE_OTHER): Payer: 59 | Admitting: Nurse Practitioner

## 2018-04-18 ENCOUNTER — Encounter: Payer: Self-pay | Admitting: Nurse Practitioner

## 2018-04-18 ENCOUNTER — Ambulatory Visit (INDEPENDENT_AMBULATORY_CARE_PROVIDER_SITE_OTHER)
Admission: RE | Admit: 2018-04-18 | Discharge: 2018-04-18 | Disposition: A | Discharge status: Home / Self Care | Payer: 59 | Source: Ambulatory Visit | Attending: Nurse Practitioner | Admitting: Nurse Practitioner

## 2018-04-18 VITALS — BP 116/80 | HR 81 | Temp 98.0°F | Resp 16 | Ht 65.0 in | Wt 191.0 lb

## 2018-04-18 DIAGNOSIS — M79642 Pain in left hand: Secondary | ICD-10-CM | POA: Diagnosis not present

## 2018-04-18 MED ORDER — MELOXICAM 7.5 MG PO TABS: 7.5000 mg | ORAL_TABLET | Freq: Every day | ORAL | 0 refills | Status: DC

## 2018-04-18 NOTE — Patient Instructions (Addendum)
For your pain, you may take mobic 7.5 mg once daily for 2 weeks Please purchase a wrist splint from the pharmacy to wear while you are sleeping. I have placed a referral to a hand specialist . Our office will call you to schedule this appointment. You should hear from our office in 7-10 days.   RICE for Routine Care of Injuries Many injuries can be cared for using rest, ice, compression, and elevation (RICE therapy). Using RICE therapy can help to lessen pain and swelling. It can help your body to heal. Rest Reduce your normal activities and avoid using the injured part of your body. You can go back to your normal activities when you feel okay and your doctor says it is okay. Ice Do not put ice on your bare skin.  Put ice in a plastic bag.  Place a towel between your skin and the bag.  Leave the ice on for 20 minutes, 2-3 times a day.  Do this for as long as told by your doctor. Compression Compression means putting pressure on the injured area. This can be done with an elastic bandage. If an elastic bandage has been applied:  Remove and reapply the bandage every 3-4 hours or as told by your doctor.  Make sure the bandage is not wrapped too tight. Wrap the bandage more loosely if part of your body beyond the bandage is blue, swollen, cold, painful, or loses feeling (numb).  See your doctor if the bandage seems to make your problems worse.  Elevation Elevation means keeping the injured area raised. Raise the injured area above your heart or the center of your chest if you can. When should I get help? You should get help if:  You keep having pain and swelling.  Your symptoms get worse.  Get help right away if: You should get help right away if:  You have sudden bad pain at or below the area of your injury.  You have redness or more swelling around your injury.  You have tingling or numbness at or below the injury that does not go away when you take off the bandage.  This  information is not intended to replace advice given to you by your health care provider. Make sure you discuss any questions you have with your health care provider. Document Released: 04/25/2008 Document Revised: 10/04/2016 Document Reviewed: 10/15/2014 Elsevier Interactive Patient Education  2017 [employer].

## 2018-04-18 NOTE — Progress Notes (Signed)
Name: [name]   MRN: [id number]    DOB: [birth year]   Date:04/18/2018       Progress Note  Subjective  Chief Complaint  Chief Complaint  Patient presents with  . 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  Patient Active Problem List   Diagnosis Date Noted  . Chronic pain of left ankle 04/13/2018  . Sinusitis 11/25/2016  . Vitamin D deficiency 07/28/2016  . Routine general medical examination at a health care facility 04/25/2016  . Obesity 04/25/2016  . Tobacco abuse 04/25/2016  . Community acquired pneumonia 09/28/2014  . Anemia 09/28/2014  . Menorrhagia 09/28/2014  . Thrombocytopenia (HCC) 09/28/2014  . CAP (community acquired pneumonia) 09/28/2014    Social History   Tobacco Use  . Smoking status: Current Every Day Smoker    Packs/day: 1.00    Years: 12.00    Pack years: 12.00    Types: Cigarettes  . Smokeless tobacco: Never Used  Substance Use Topics  . Alcohol use: Yes    Comment: Occasionally - holidays     Current Outpatient Medications:  .  Diclofenac Sodium (PENNSAID) 2 % SOLN, Place 1 application onto the skin 2 (two) times daily., Disp: 1 Bottle, Rfl: 3 .  meloxicam (MOBIC) 15 MG tablet, Take by mouth., Disp: , Rfl:   Allergies  Allergen Reactions  . Fish Allergy Anaphylaxis and Shortness Of Breath  . Naprosyn [Naproxen] Anxiety and Other (See Comments)    Vaginal bleeding and  anxiety  . Sulfa Antibiotics Anxiety and Other (See Comments)    Vaginal bleeding and anxiety    ROS  No other specific complaints in a complete review of systems (except as listed in HPI above).  Objective  Vitals:   04/18/18 1253  BP: 116/80  Pulse: 81  Resp: 16  Temp: 98 F (36.7 C)  TempSrc: Oral  SpO2: 98%  Weight: 191 lb (86.6 kg)  Height:  (1.651 m)    Body mass index is 31.78 kg/m.  Nursing Note and Vital Signs reviewed.  Physical Exam Constitutional: Patient appears well-developed and well-nourished. No distress.  HEENT: head atraumatic, normocephalic, pupils equal and reactive to light, EOM's intact,  neck supple without lymphadenopathy, oropharynx pink and moist without exudate Cardiovascular: Normal rate, regular rhythm, distal pulses intact.  Pulmonary/Chest: Effort normal and breath sounds clear. No respiratory distress or retractions. Musculoskeletal: Normal range of motion, no joint effusions. No gross deformities. Negative tinels sign and phalens test. Neurological: She is alert and oriented to person, place, and time.  Coordination, balance, strength, speech and gait are normal.  Skin: Skin is warm and dry. No rash noted. No erythema.  Psychiatric: Patient has a normal mood and affect. behavior is normal. Judgment and thought content normal.  Assessment & Plan  Left hand pain Discussed trial of RICE, mobic,  splint to see if pain improves, have placed referral to hand specialist for continued pain. She is requesting an xray of the hand today, and although we discussed that hand xray may not provide any additional information, she would still like to have an xray done -Red flags and when to present for emergency care or RTC including new/worsening/un-resolving symptoms, reviewed with patient at time of visit. Follow up and care instructions discussed and provided in AVS. - meloxicam (MOBIC) 7.5 MG tablet; Take 1 tablet (7.5 mg total) by mouth daily.   Dispense: 14 tablet; Refill: 0-dosing and side effects discussed - Ambulatory referral to Orthopedic Surgery - DG Hand Complete Left; Future

## 2018-04-19 ENCOUNTER — Other Ambulatory Visit: Payer: Self-pay

## 2018-05-03 ENCOUNTER — Ambulatory Visit: Payer: 59 | Admitting: Family Medicine

## 2018-05-03 DIAGNOSIS — Z0289 Encounter for other administrative examinations: Secondary | ICD-10-CM

## 2018-05-03 NOTE — Progress Notes (Deleted)
[name] - 42 y.o. female MRN [id number]  Date of birth: 07/22/1976  SUBJECTIVE:  Including CC & ROS.  No chief complaint on file.   [name] is a 42 y.o. female that is  ***.  ***   Review of Systems  HISTORY: Past Medical, Surgical, Social, and Family History Reviewed & Updated per EMR.   Pertinent Historical Findings include:  Past Medical History:  Diagnosis Date  . Migraine   . Sarcoidosis   . Vertigo     Past Surgical History:  Procedure Laterality Date  . CESAREAN SECTION    . CESAREAN SECTION    . KNEE SURGERY      Allergies  Allergen Reactions  . Fish Allergy Anaphylaxis and Shortness Of Breath  . Naprosyn [Naproxen] Anxiety and Other (See Comments)    Vaginal bleeding and anxiety  . Sulfa Antibiotics Anxiety and Other (See Comments)    Vaginal bleeding and anxiety    Family History  Problem Relation Age of Onset  . Lupus Mother   . Healthy Father   . Heart failure Maternal Grandmother   . Diabetes Maternal Grandmother   . Hypertension Maternal Grandmother   . Diabetes Maternal Grandfather   . Diabetes Paternal Grandmother      Social History   Socioeconomic History  . Marital status: Married    Spouse name: Not on file  . Number of children: 3  . Years of education: 3714  . Highest education level: Not on file  Occupational History  . Occupation: Runner, broadcasting/film/videoDriver  Social Needs  . Financial resource strain: Not on file  . Food insecurity:    Worry: Not on file    Inability: Not on file  . Transportation needs:    Medical: Not on file    Non-medical: Not on file  Tobacco Use  . Smoking status: Current Every Day Smoker    Packs/day: 1.00    Years: 12.00    Pack years: 12.00    Types: Cigarettes  . Smokeless tobacco: Never Used  Substance and Sexual Activity  . Alcohol use: Yes    Comment: Occasionally - holidays  . Drug use: No  . Sexual activity: Not on file  Lifestyle  . Physical activity:    Days per week: Not on file   Minutes per session: Not on file  . Stress: Not on file  Relationships  . Social connections:    Talks on phone: Not on file    Gets together: Not on file    Attends religious service: Not on file    Active member of club or organization: Not on file    Attends meetings of clubs or organizations: Not on file    Relationship status: Not on file  . Intimate partner violence:    Fear of current or ex partner: Not on file    Emotionally abused: Not on file    Physically abused: Not on file    Forced sexual activity: Not on file  Other Topics Concern  . Not on file  Social History Narrative   Fun: Dancing   Denies abuse and feels safe at home     PHYSICAL EXAM:  VS: There were no vitals taken for this visit. Physical Exam Gen: NAD, alert, cooperative with exam, well-appearing ENT: normal lips, normal nasal mucosa,  Eye: normal EOM, normal conjunctiva and lids CV:  no edema, +2 pedal pulses   Resp: no accessory muscle use, non-labored,  GI: no masses or tenderness,  no hernia  Skin: no rashes, no areas of induration  Neuro: normal tone, normal sensation to touch Psych:  normal insight, alert and oriented MSK:  ***      ASSESSMENT & PLAN:   No problem-specific Assessment & Plan notes found for this encounter.

## 2018-05-10 ENCOUNTER — Ambulatory Visit (INDEPENDENT_AMBULATORY_CARE_PROVIDER_SITE_OTHER): Payer: Self-pay | Admitting: Orthopaedic Surgery

## 2018-05-14 ENCOUNTER — Ambulatory Visit: Payer: 59 | Admitting: Obstetrics and Gynecology

## 2018-05-16 ENCOUNTER — Other Ambulatory Visit: Payer: Self-pay

## 2018-07-12 ENCOUNTER — Ambulatory Visit: Payer: 59 | Admitting: Nurse Practitioner

## 2018-07-12 DIAGNOSIS — Z0289 Encounter for other administrative examinations: Secondary | ICD-10-CM

## 2019-01-15 DIAGNOSIS — Z01411 Encounter for gynecological examination (general) (routine) with abnormal findings: Secondary | ICD-10-CM | POA: Diagnosis not present

## 2019-01-15 DIAGNOSIS — Z6830 Body mass index (BMI) 30.0-30.9, adult: Secondary | ICD-10-CM | POA: Diagnosis not present

## 2019-01-15 DIAGNOSIS — F17210 Nicotine dependence, cigarettes, uncomplicated: Secondary | ICD-10-CM | POA: Diagnosis not present

## 2019-01-15 DIAGNOSIS — Z124 Encounter for screening for malignant neoplasm of cervix: Secondary | ICD-10-CM | POA: Diagnosis not present

## 2019-01-15 DIAGNOSIS — N912 Amenorrhea, unspecified: Secondary | ICD-10-CM | POA: Diagnosis not present

## 2019-01-15 DIAGNOSIS — Z113 Encounter for screening for infections with a predominantly sexual mode of transmission: Secondary | ICD-10-CM | POA: Diagnosis not present

## 2019-01-15 DIAGNOSIS — R87610 Atypical squamous cells of undetermined significance on cytologic smear of cervix (ASC-US): Secondary | ICD-10-CM | POA: Diagnosis not present

## 2019-01-15 DIAGNOSIS — Z1231 Encounter for screening mammogram for malignant neoplasm of breast: Secondary | ICD-10-CM | POA: Diagnosis not present

## 2019-01-15 DIAGNOSIS — N951 Menopausal and female climacteric states: Secondary | ICD-10-CM | POA: Diagnosis not present

## 2019-01-31 DIAGNOSIS — N951 Menopausal and female climacteric states: Secondary | ICD-10-CM | POA: Diagnosis not present

## 2019-01-31 DIAGNOSIS — B009 Herpesviral infection, unspecified: Secondary | ICD-10-CM | POA: Diagnosis not present

## 2019-02-07 ENCOUNTER — Telehealth: Payer: Self-pay | Admitting: Nurse Practitioner

## 2019-02-07 NOTE — Telephone Encounter (Signed)
Copied from CRM [id number]. Topic: General - Other >> Feb 07, 2019 11:26 AM Elliot Gault wrote: Relation to pt: self  Call back number: [phone]  Pharmacy: [id number] [place], [place] - [address] [phone] (Phone) [phone] (Fax)    Reason for call:  Patient requesting vertigo for 2 weeks and would like PCP to call meclizine (ANTIVERT) tablet 25 mg, please advise

## 2019-02-11 MED ORDER — MECLIZINE HCL 25 MG PO TABS: 25.0000 mg | ORAL_TABLET | Freq: Three times a day (TID) | ORAL | 0 refills | Status: DC | PRN

## 2019-02-11 NOTE — Telephone Encounter (Signed)
I am concerned with how long your symptoms have been going on  I appreciate that we are having a pandemic right now, I have sent a prescription for meclizine for you, but I would recommend scheduling an office visit for further evaluation.

## 2019-02-11 NOTE — Telephone Encounter (Signed)
Pt informed of below. Patient verbalized understanding.  

## 2019-05-07 ENCOUNTER — Telehealth: Payer: Self-pay

## 2019-05-07 ENCOUNTER — Telehealth: Payer: Self-pay | Admitting: *Deleted

## 2019-05-07 ENCOUNTER — Encounter: Payer: Self-pay | Admitting: Internal Medicine

## 2019-05-07 ENCOUNTER — Ambulatory Visit (INDEPENDENT_AMBULATORY_CARE_PROVIDER_SITE_OTHER): Payer: HRSA Program | Admitting: Internal Medicine

## 2019-05-07 ENCOUNTER — Ambulatory Visit: Payer: Self-pay | Admitting: *Deleted

## 2019-05-07 DIAGNOSIS — R05 Cough: Secondary | ICD-10-CM

## 2019-05-07 DIAGNOSIS — Z20822 Contact with and (suspected) exposure to covid-19: Secondary | ICD-10-CM | POA: Insufficient documentation

## 2019-05-07 DIAGNOSIS — Z20828 Contact with and (suspected) exposure to other viral communicable diseases: Secondary | ICD-10-CM

## 2019-05-07 DIAGNOSIS — R5383 Other fatigue: Secondary | ICD-10-CM

## 2019-05-07 NOTE — Progress Notes (Signed)
Virtual Visit via Audio Note  I connected with [name] on 05/07/19 at  2:20 PM EDT by an audio-only enabled telemedicine application and verified that I am speaking with the correct person using two identifiers.  The patient and the provider were at separate locations throughout the entire encounter.   I discussed the limitations of evaluation and management by telemedicine and the availability of in person appointments. The patient expressed understanding and agreed to proceed.  History of Present Illness: The patient is a 43 y.o. femlale with visit for exposure to covid-19. 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 She has been fatigued and coughing in the last 3-4 days. She denies known fevers or chills but gets hot flashes sometimes not different from usual lately. Has no SOB or chest tightness. She is having a lot of anxiety in the last few days. Overall it is stable. Has tried nothing  Observations/Objective: Voice strong, no dyspnea, mental status is A and O times 3  Assessment and Plan: See problem oriented charting  Follow Up Instructions: covid-19 test ordered  Visit time 8 minutes: that time was spent in face to face counseling and coordination of care with the patient: counseled about as above  I discussed the assessment and treatment plan with the patient. The patient was provided an opportunity to ask questions and all were answered. The patient agreed with the plan and demonstrated an understanding of the instructions.   The patient was advised to call back or seek an in-person evaluation if the symptoms worsen or if the condition fails to improve as anticipated.  Hoyt Koch, MD

## 2019-05-07 NOTE — Telephone Encounter (Signed)
Patient needing testing due to new symptoms of cough, congestion and exposure to covid positive person. 

## 2019-05-07 NOTE — Telephone Encounter (Signed)
Patient needing testing due to new symptoms of cough, congestion and exposure to covid positive person.

## 2019-05-07 NOTE — Telephone Encounter (Signed)
Patient calls requesting [phone] testing due to someone in her working environment tested positive yesterday. 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 Has a dry cough, runny nose and hoarse voice for about 4 days now. No fever and no SOB. Her employer recommended she be tested. Transferred for virtual appointment.   Reason for Disposition . [1] COVID-19 EXPOSURE (Close Contact) within last 14 days AND [2] needs COVID-19 lab test to return to work AND [3] NO symptoms  Answer Assessment - Initial Assessment Questions 1. CLOSE CONTACT: "Who is the person with the confirmed or suspected COVID-19 infection that you were exposed to?"     Someone at work. Patient cleans the work spaces. 2. PLACE of CONTACT: "Where were you when you were exposed to COVID-19?" (e.g., home, school, medical waiting room; which city?)     work 3. TYPE of CONTACT: "How much contact was there?" (e.g., sitting next to, live in same house, work in same office, same building)     Same meeting area for about 10 minutes each day. 4. DURATION of CONTACT: "How long were you in contact with the COVID-19 patient?" (e.g., a few seconds, passed by person, a few minutes, live with the patient)    10 or so minutes, she cleans their work space 5. DATE of CONTACT: "When did you have contact with a COVID-19 patient?" (e.g., how many days ago)     Yesterday and the day before. 6. TRAVEL: "Have you traveled out of the country recently?" If so, "When and where?"no     * Also ask about out-of-state travel, since the CDC has identified some high-risk cities for community spread in the [place].     * Note: Travel becomes less relevant if there is widespread community transmission where the patient lives.      7. COMMUNITY SPREAD: "Are there lots of cases of COVID-19 (community spread) where you live?" (See public health department website, if unsure)       yes 8. SYMPTOMS: "Do you have any symptoms?" (e.g., fever,  cough, breathing difficulty)   Dry cough and runny nose and hoarse voice 9. PREGNANCY OR POSTPARTUM: "Is there any chance you are pregnant?" "When was your last menstrual period?" "Did you deliver in the last 2 weeks?"     no 10. HIGH RISK: "Do you have any heart or lung problems? Do you have a weak immune system?" (e.g., CHF, COPD, asthma, HIV positive, chemotherapy, renal failure, diabetes mellitus, sickle cell anemia)       no  Protocols used: CORONAVIRUS (COVID-19) EXPOSURE-A-AH

## 2019-05-07 NOTE — Assessment & Plan Note (Signed)
Ordered testing and advised to quarantine until test results return.

## 2019-05-08 ENCOUNTER — Other Ambulatory Visit: Payer: Self-pay

## 2019-05-08 DIAGNOSIS — Z20822 Contact with and (suspected) exposure to covid-19: Secondary | ICD-10-CM

## 2019-05-10 ENCOUNTER — Ambulatory Visit: Payer: Self-pay | Admitting: *Deleted

## 2019-05-10 NOTE — Telephone Encounter (Signed)
Pt called in requesting COVID results.   I attempted to assisted her set up [place].password change.  She was already active.   However on her end even after putting in a new password and setting it she was still getting a fail attempt on her side in Mychart. I let her know her COVID-19 test results were not available yet. I had her call [phone] so one of the agents could get her set up on Mychart.  She agreed with this plan.

## 2019-05-11 LAB — NOVEL CORONAVIRUS, NAA: SARS-CoV-2, NAA: NOT DETECTED

## 2019-07-16 DIAGNOSIS — F4323 Adjustment disorder with mixed anxiety and depressed mood: Secondary | ICD-10-CM | POA: Diagnosis not present

## 2019-08-23 DIAGNOSIS — I10 Essential (primary) hypertension: Secondary | ICD-10-CM | POA: Diagnosis not present

## 2019-08-23 DIAGNOSIS — R42 Dizziness and giddiness: Secondary | ICD-10-CM | POA: Diagnosis not present

## 2019-08-30 ENCOUNTER — Ambulatory Visit (INDEPENDENT_AMBULATORY_CARE_PROVIDER_SITE_OTHER): Payer: BC Managed Care – PPO | Admitting: Family

## 2019-08-30 ENCOUNTER — Encounter (HOSPITAL_COMMUNITY): Payer: Self-pay | Admitting: Emergency Medicine

## 2019-08-30 ENCOUNTER — Other Ambulatory Visit: Payer: Self-pay

## 2019-08-30 ENCOUNTER — Emergency Department (HOSPITAL_COMMUNITY)
Admission: EM | Admit: 2019-08-30 | Discharge: 2019-08-30 | Disposition: A | Discharge status: Home / Self Care | Payer: BC Managed Care – PPO | Attending: Emergency Medicine | Admitting: Emergency Medicine

## 2019-08-30 ENCOUNTER — Encounter: Payer: Self-pay | Admitting: Family

## 2019-08-30 VITALS — BP 142/76 | HR 45 | Temp 98.0°F | Ht 65.0 in | Wt 191.6 lb

## 2019-08-30 DIAGNOSIS — R42 Dizziness and giddiness: Secondary | ICD-10-CM

## 2019-08-30 DIAGNOSIS — F17210 Nicotine dependence, cigarettes, uncomplicated: Secondary | ICD-10-CM | POA: Insufficient documentation

## 2019-08-30 DIAGNOSIS — R001 Bradycardia, unspecified: Secondary | ICD-10-CM | POA: Diagnosis not present

## 2019-08-30 LAB — I-STAT BETA HCG BLOOD, ED (MC, WL, AP ONLY): I-stat hCG, quantitative: <5 m[IU]/mL (ref <5)

## 2019-08-30 LAB — URINALYSIS, ROUTINE W REFLEX MICROSCOPIC
Bilirubin Urine: NEGATIVE
Glucose, UA: NEGATIVE mg/dL
Hgb urine dipstick: NEGATIVE
Ketones, ur: NEGATIVE mg/dL
Leukocytes,Ua: NEGATIVE
Nitrite: NEGATIVE
Protein, ur: NEGATIVE mg/dL
Specific Gravity, Urine: 1.003 — ABNORMAL LOW (ref 1.005–1.030)
pH: 6 (ref 5.0–8.0)

## 2019-08-30 LAB — CBC
HCT: 42.4 % (ref 36.0–46.0)
Hemoglobin: 13 g/dL (ref 12.0–15.0)
MCH: 28.8 pg (ref 26.0–34.0)
MCHC: 30.7 g/dL (ref 30.0–36.0)
MCV: 93.8 fL (ref 80.0–100.0)
Platelets: 271 10*3/uL (ref 150–400)
RBC: 4.52 MIL/uL (ref 3.87–5.11)
RDW: 12.7 % (ref 11.5–15.5)
WBC: 4.6 10*3/uL (ref 4.0–10.5)
nRBC: 0 % (ref 0.0–0.2)

## 2019-08-30 LAB — BASIC METABOLIC PANEL
Anion gap: 10 (ref 5–15)
BUN: 10 mg/dL (ref 6–20)
CO2: 23 mmol/L (ref 22–32)
Calcium: 9.3 mg/dL (ref 8.9–10.3)
Chloride: 103 mmol/L (ref 98–111)
Creatinine, Ser: 0.79 mg/dL (ref 0.44–1.00)
GFR calc Af Amer: >60 mL/min (ref >60)
GFR calc non Af Amer: >60 mL/min (ref >60)
Glucose, Bld: 114 mg/dL — ABNORMAL HIGH (ref 70–99)
Potassium: 3.7 mmol/L (ref 3.5–5.1)
Sodium: 136 mmol/L (ref 135–145)

## 2019-08-30 LAB — CBG MONITORING, ED: Glucose-Capillary: 87 mg/dL (ref 70–99)

## 2019-08-30 MED ORDER — SODIUM CHLORIDE 0.9% FLUSH: 3.0000 mL | Freq: Once | INTRAVENOUS | Status: DC

## 2019-08-30 NOTE — ED Triage Notes (Signed)
Pt states she was in [place] last Friday, became very dizzy while driving and went to [facility], states she left [facility] because she needed to get back home to her children, states she was told she was bradycardic in the hospital but was not given any other results. Had f/u with pcp today who advised her to come to the ER for continued bradycardia and dizziness. Pt a/ox4, resp e/u, nad. No neuro deficits.

## 2019-08-30 NOTE — ED Provider Notes (Signed)
[facility] DEPARTMENT Provider Note   CSN: [id number] Arrival date & time: 08/30/19  1457     History   Chief Complaint Chief Complaint  Patient presents with  . Bradycardia  . Dizziness    HPI [name] is a 43 y.o. female.     The history is provided by the patient.  Dizziness Quality:  Vertigo Severity:  Mild Onset quality:  Gradual Timing:  Intermittent Progression:  Waxing and waning Chronicity:  New Context: head movement   Relieved by:  Being still Worsened by:  Movement and turning head Associated symptoms: no blood in stool, no chest pain, no diarrhea, no headaches, no hearing loss, no nausea, no palpitations, no shortness of breath, no syncope, no tinnitus, no vision changes, no vomiting and no weakness   Risk factors: hx of vertigo     Past Medical History:  Diagnosis Date  . Migraine   . Sarcoidosis   . Vertigo     Patient Active Problem List   Diagnosis Date Noted  . Suspected COVID-19 virus infection 05/07/2019  . Chronic pain of left ankle 04/13/2018  . Sinusitis 11/25/2016  . Vitamin D deficiency 07/28/2016  . Routine general medical examination at a health care facility 04/25/2016  . Obesity 04/25/2016  . Tobacco abuse 04/25/2016  . Community acquired pneumonia 09/28/2014  . Anemia 09/28/2014  . Menorrhagia 09/28/2014  . Thrombocytopenia ([place]) 09/28/2014  . CAP (community acquired pneumonia) 09/28/2014    Past Surgical History:  Procedure Laterality Date  . CESAREAN SECTION    . CESAREAN SECTION    . KNEE SURGERY       OB History   No obstetric history on file.      Home Medications    Prior to Admission medications   Medication Sig Start Date End Date Taking? Authorizing Provider  Diclofenac Sodium (PENNSAID) 2 % SOLN Place 1 application onto the skin 2 (two) times daily. Patient taking differently: Place 1 application onto the skin 2 (two) times daily as needed (Dry skin).  04/13/18  Yes  Rosemarie Ax, MD  meclizine (ANTIVERT) 25 MG tablet Take 1 tablet (25 mg total) by mouth 3 (three) times daily as needed for dizziness. 02/11/19  Yes Lance Sell, NP    Family History Family History  Problem Relation Age of Onset  . Lupus Mother   . Healthy Father   . Heart failure Maternal Grandmother   . Diabetes Maternal Grandmother   . Hypertension Maternal Grandmother   . Diabetes Maternal Grandfather   . Diabetes Paternal Grandmother     Social History Social History   Tobacco Use  . Smoking status: Current Every Day Smoker    Packs/day: 1.00    Years: 12.00    Pack years: 12.00    Types: Cigarettes  . Smokeless tobacco: Never Used  Substance Use Topics  . Alcohol use: Yes    Comment: Occasionally - holidays  . Drug use: No     Allergies   Fish allergy, Naprosyn [naproxen], and Sulfa antibiotics   Review of Systems Review of Systems  Constitutional: Negative for chills and fever.  HENT: Negative for ear pain, hearing loss, sore throat and tinnitus.   Eyes: Negative for pain and visual disturbance.  Respiratory: Negative for cough and shortness of breath.   Cardiovascular: Negative for chest pain, palpitations and syncope.  Gastrointestinal: Negative for abdominal pain, blood in stool, diarrhea, nausea and vomiting.  Genitourinary: Negative for dysuria and hematuria.  Musculoskeletal: Negative for arthralgias and back pain.  Skin: Negative for color change and rash.  Neurological: Positive for dizziness. Negative for seizures, syncope, weakness and headaches.  All other systems reviewed and are negative.    Physical Exam Updated Vital Signs  ED Triage Vitals  Enc Vitals Group     BP 08/30/19 1503 133/87     Pulse Rate 08/30/19 1503 90     Resp 08/30/19 1503 20     Temp 08/30/19 1503 98.4 F (36.9 C)     Temp Source 08/30/19 1503 Oral     SpO2 08/30/19 1503 99 %     Weight --      Height --      Head Circumference --      Peak Flow  --      Pain Score 08/30/19 1511 0     Pain Loc --      Pain Edu? --      Excl. in GC? --     Physical Exam Vitals signs and nursing note reviewed.  Constitutional:      General: She is not in acute distress.    Appearance: Normal appearance. She is well-developed.  HENT:     Head: Normocephalic and atraumatic.     Nose: Nose normal.     Mouth/Throat:     Mouth: Mucous membranes are moist.  Eyes:     Extraocular Movements: Extraocular movements intact.     Conjunctiva/sclera: Conjunctivae normal.     Pupils: Pupils are equal, round, and reactive to light.  Neck:     Musculoskeletal: Normal range of motion and neck supple.  Cardiovascular:     Rate and Rhythm: Normal rate and regular rhythm.     Pulses: Normal pulses.     Heart sounds: Normal heart sounds. No murmur.  Pulmonary:     Effort: Pulmonary effort is normal. No respiratory distress.     Breath sounds: Normal breath sounds.  Abdominal:     General: Abdomen is flat.     Palpations: Abdomen is soft.     Tenderness: There is no abdominal tenderness.  Musculoskeletal: Normal range of motion.        General: No tenderness.  Skin:    General: Skin is warm and dry.     Capillary Refill: Capillary refill takes less than 2 seconds.  Neurological:     General: No focal deficit present.     Mental Status: She is alert and oriented to person, place, and time.     Cranial Nerves: No cranial nerve deficit.     Sensory: No sensory deficit.     Motor: No weakness.     Coordination: Coordination normal.     Gait: Gait normal.     Comments: 5+ out of 5 strength throughout, normal sensation, normal finger-to-nose finger, normal gait      ED Treatments / Results  Labs (all labs ordered are listed, but only abnormal results are displayed) Labs Reviewed  BASIC METABOLIC PANEL - Abnormal; Notable for the following components:      Result Value   Glucose, Bld 114 (*)    All other components within normal limits  CBC   URINALYSIS, ROUTINE W REFLEX MICROSCOPIC  CBG MONITORING, ED  I-STAT BETA HCG BLOOD, ED ([facility], [facility], [facility] ONLY)    EKG EKG Interpretation  Date/Time:  Friday August 30 2019 15:10:40 EDT Ventricular Rate:  66 PR Interval:  154 QRS Duration: 70 QT Interval:  402 QTC Calculation: 421  R Axis:   41 Text Interpretation:  Normal sinus rhythm Normal ECG Confirmed by Virgina NorfolkAdam, Kelina Beauchamp [id number]) on 08/30/2019 6:05:51 PM   Radiology No results found.  Procedures Procedures (including critical care time)  Medications Ordered in ED Medications  sodium chloride flush (NS) 0.9 % injection 3 mL (3 mLs Intravenous Not Given 08/30/19 1846)     Initial Impression / Assessment and Plan / ED Course  I have reviewed the triage vital signs and the nursing notes.  Pertinent labs & imaging results that were available during my care of the patient were reviewed by me and considered in my medical decision making (see chart for details).     [name] is a 43 year old female with no significant medical history presents the ED with dizziness, bradycardia.  Patient with normal vitals upon arrival.  No fever.  Patient with intermittent dizziness for the last several months.  Meclizine has helped.  Symptoms come and go and are worse when she turns her head.  Primary care doctor sent her due to concern for bradycardia.  Heart rate was in the 50s in the clinic.  She has a normal sinus rhythm on EKG.  Heart rate is in the 60s and 70s on my evaluation.  There is no heart block.  There is no junctional rhythm.  Lab work showed no significant anemia, electrolyte abnormality, kidney injury.  She does not have any symptoms currently.  She has had no loss of consciousness with these episodes of intermittent dizziness.  Overall likely patient with vertigo.  No concerning history or physical findings to suggest a significant arrhythmia issue.  Talked with cardiology on the phone who recommend that patient is fine to follow-up  outpatient for likely a heart monitor.  She is not having any chest pain or shortness of breath.  She has never lost consciousness with the symptoms.  Has had brain MRI recently that was unremarkable as well.  Symptoms are not concerning for stroke.  She is neurologically intact.  Overall she is very well-appearing.  We will have her follow-up with ear nose and throat doctor as well as she may benefit from vestibular testing.  She continues to take meclizine with improvement.  Was given strict return precautions and discharged from the ED in good condition.  This chart was dictated using voice recognition software.  Despite best efforts to proofread,  errors can occur which can change the documentation meaning.    Final Clinical Impressions(s) / ED Diagnoses   Final diagnoses:  Dizziness    ED Discharge Orders    None       Virgina NorfolkCuratolo, Jori Thrall, DO 08/30/19 1918

## 2019-08-30 NOTE — Progress Notes (Signed)
[name] is a 43 y.o. female with the following history as recorded in EpicCare:  Patient Active Problem List   Diagnosis Date Noted  . Suspected COVID-19 virus infection 05/07/2019  . Chronic pain of left ankle 04/13/2018  . Sinusitis 11/25/2016  . Vitamin D deficiency 07/28/2016  . Routine general medical examination at a health care facility 04/25/2016  . Obesity 04/25/2016  . Tobacco abuse 04/25/2016  . Community acquired pneumonia 09/28/2014  . Anemia 09/28/2014  . Menorrhagia 09/28/2014  . Thrombocytopenia ([place]) 09/28/2014  . CAP (community acquired pneumonia) 09/28/2014    Current Outpatient Medications  Medication Sig Dispense Refill  . Diclofenac Sodium (PENNSAID) 2 % SOLN Place 1 application onto the skin 2 (two) times daily. 1 Bottle 3  . meclizine (ANTIVERT) 25 MG tablet Take 1 tablet (25 mg total) by mouth 3 (three) times daily as needed for dizziness. 20 tablet 0  . meloxicam (MOBIC) 7.5 MG tablet Take 1 tablet (7.5 mg total) by mouth daily. 14 tablet 0   No current facility-administered medications for this visit.     Allergies: Fish allergy, Naprosyn [naproxen], and Sulfa antibiotics  Past Medical History:  Diagnosis Date  . Migraine   . Sarcoidosis   . Vertigo     Past Surgical History:  Procedure Laterality Date  . CESAREAN SECTION    . CESAREAN SECTION    . KNEE SURGERY      Family History  Problem Relation Age of Onset  . Lupus Mother   . Healthy Father   . Heart failure Maternal Grandmother   . Diabetes Maternal Grandmother   . Hypertension Maternal Grandmother   . Diabetes Maternal Grandfather   . Diabetes Paternal Grandmother     Social History   Tobacco Use  . Smoking status: Current Every Day Smoker    Packs/day: 1.00    Years: 12.00    Pack years: 12.00    Types: Cigarettes  . Smokeless tobacco: Never Used  Substance Use Topics  . Alcohol use: Yes    Comment: Occasionally - holidays    Subjective:  Patient presents for a  1 week history of dizziness; was seen at the ER in [place] last week with similar symptoms; was noted to have symptomatic bradycardia and admission was recommended; patient opted against admission and was encouraged to see cardiology in follow-up. Has never had problems with bradycardia before- does look like she has had intermittent problems with dizziness in the past; has history of vertigo; Pulse today is noted to be at 45;    LMP- 2-3 years ago;       Objective:  Vitals:   08/30/19 1312  BP: (!) 142/76  Pulse: (!) 45  Temp: 98 F (36.7 C)  TempSrc: Oral  SpO2: 99%  Weight: 191 lb 9.6 oz (86.9 kg)  Height: 5\' 5"  (1.651 m)    General: Well developed, well nourished, in no acute distress  Skin : Warm and dry.  Head: Normocephalic and atraumatic  Eyes: Sclera and conjunctiva clear; pupils round and reactive to light; extraocular movements intact  Ears: External normal; canals clear; tympanic membranes normal  Oropharynx: Pink, supple. No suspicious lesions  Neck: Supple without thyromegaly, adenopathy  Lungs: Respirations unlabored; clear to auscultation bilaterally without wheeze, rales, rhonchi  CVS exam: normal rate and regular rhythm.  Neurologic: Alert and oriented; speech intact; face symmetrical; moves all extremities well; CNII-XII intact without focal deficit   Assessment:  1. Dizziness   2. Bradycardia  Plan:  EKG done in office- does show bradycardia; patient is symptomatic and admission was recommended a week ago due to symptoms. Referral to cardiology updated but again discussed my concern about how low her heart rate is and nature of her symptoms; encouraged ER evaluation again and patient expresses understanding. She is planning to be in a wedding tomorrow and will have to decide about the right time to go to the hospital.  Follow-up to be determined.   No follow-ups on file.  Orders Placed This Encounter  Procedures  . Ambulatory referral to Cardiology     Referral Priority:   Emergency    Referral Type:   Consultation    Referral Reason:   Specialty Services Required    Requested Specialty:   Cardiology    Number of Visits Requested:   1  . EKG 12-Lead    Requested Prescriptions    No prescriptions requested or ordered in this encounter

## 2019-08-30 NOTE — ED Notes (Signed)
Pt CBG 88 

## 2019-08-30 NOTE — ED Notes (Signed)
Pt left without discharge  Instructions uber waiting on her

## 2019-08-30 NOTE — Discharge Instructions (Addendum)
Please return to the ED if symptoms worsen.  Follow-up with ENT and cardiology as discussed.

## 2019-09-03 ENCOUNTER — Other Ambulatory Visit: Payer: Self-pay

## 2019-09-03 ENCOUNTER — Encounter: Payer: Self-pay | Admitting: Adult Health

## 2019-09-03 ENCOUNTER — Ambulatory Visit: Payer: BC Managed Care – PPO | Admitting: Cardiovascular Disease

## 2019-09-03 ENCOUNTER — Ambulatory Visit (INDEPENDENT_AMBULATORY_CARE_PROVIDER_SITE_OTHER): Payer: BC Managed Care – PPO | Admitting: Adult Health

## 2019-09-03 VITALS — BP 128/92 | HR 62 | Ht 65.0 in | Wt 194.2 lb

## 2019-09-03 DIAGNOSIS — R42 Dizziness and giddiness: Secondary | ICD-10-CM

## 2019-09-03 DIAGNOSIS — R001 Bradycardia, unspecified: Secondary | ICD-10-CM

## 2019-09-03 DIAGNOSIS — Z79899 Other long term (current) drug therapy: Secondary | ICD-10-CM

## 2019-09-03 DIAGNOSIS — I10 Essential (primary) hypertension: Secondary | ICD-10-CM

## 2019-09-03 DIAGNOSIS — R000 Tachycardia, unspecified: Secondary | ICD-10-CM

## 2019-09-03 NOTE — Patient Instructions (Signed)
Medication Instructions:  Continue current medications  If you need a refill on your cardiac medications before your next appointment, please call your pharmacy.  Labwork: BMP, CBC HERE IN OUR OFFICE AT [facility]  You will NOT need to fast   Take the provided lab slips with you to the lab for your blood draw.   When you have your labs (blood work) drawn today and your tests are completely normal, you will receive your results only by MyChart Message (if you have MyChart) -OR-  A paper copy in the mail.  If you have any lab test that is abnormal or we need to change your treatment, we will call you to review these results.  Testing/Procedures: Your physician has requested that you have an echocardiogram. Echocardiography is a painless test that uses sound waves to create images of your heart. It provides your doctor with information about the size and shape of your heart and how well your heart's chambers and valves are working. This procedure takes approximately one hour. There are no restrictions for this procedure.  Your physician has recommended that you wear a 7 days Zio Patch monitor. Holter monitors are medical devices that record the heart's electrical activity. Doctors most often use these monitors to diagnose arrhythmias. Arrhythmias are problems with the speed or rhythm of the heartbeat. The monitor is a small, portable device. You can wear one while you do your normal daily activities. This is usually used to diagnose what is causing palpitations/syncope (passing out).   Follow-Up: . Your physician recommends that you schedule a follow-up appointment in: 1 Month with Jory Sims   At [facility], you and your health needs are our priority.  As part of our continuing mission to provide you with exceptional heart care, we have created designated Provider Care Teams.  These Care Teams include your primary Cardiologist (physician) and Advanced Practice Providers (APPs -  Physician  Assistants and Nurse Practitioners) who all work together to provide you with the care you need, when you need it.  Thank you for choosing [facility] at [facility]!!

## 2019-09-03 NOTE — Progress Notes (Signed)
Cardiology Office Note   Date:  09/03/2019   ID:  [name], DOB 04-May-1976, MRN [id number]  PCP:  Lance Sell, NP  Cardiologist:  To be established with Dr.Lomax  PP:[id number] and Palpitations.    History of Present Illness: [name] is a 43 y.o. female who presents for new patient cardiac evaluation with symptoms of bradycardia and dizziness.  She was seen in the emergency room on 08/30/2019, with similar complaints.  The patient stated that the dizziness was worse when she turned her head.  Heart rate was found to be in the 50s per primary care physician.  EKG revealed normal sinus rhythm heart rate in the 60s there was no evidence of heart block or junctional rhythm.  Labs were completed and there was no electrolyte abnormality, kidney injury, or anemia.  [name] did not endorse syncope or presyncope related to the dizziness and bradycardia.  She was recommended to see an ENT for evaluation of vertigo.  [name] has a history of sinusitis, vitamin D deficiency, lupus, ongoing tobacco abuse, thrombocytopenia, and prior history of community-acquired pneumonia in 2015.  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  She is concerned about driving with her symptoms.  She is overdue for DOT physical.  She states that she feels frequent palpitations and dizziness which occur daily on and off throughout the day.  She sometimes feels as if she is having "a panic attack" while she is driving and she has to pull over because she feels so badly and her heart is skipping was associated trouble taking deep breaths.  She is felt no improvement of her symptoms on meclizine.  She does not drink a lot of caffeine.  She denies associated chest pain, or near syncope associated with the palpitations and dizziness.  Past Medical History:  Diagnosis Date  . Iron deficiency anemia   . Migraine   . Sarcoidosis   . Vertigo     Past Surgical History:  Procedure Laterality Date  . CESAREAN  SECTION    . CESAREAN SECTION    . KNEE SURGERY       Current Outpatient Medications  Medication Sig Dispense Refill  . Diclofenac Sodium (PENNSAID) 2 % SOLN Place 1 application onto the skin 2 (two) times daily. (Patient taking differently: Place 1 application onto the skin 2 (two) times daily as needed (Dry skin). ) 1 Bottle 3  . meclizine (ANTIVERT) 25 MG tablet Take 1 tablet (25 mg total) by mouth 3 (three) times daily as needed for dizziness. 20 tablet 0   No current facility-administered medications for this visit.     Allergies:   Fish allergy, Naprosyn [naproxen], and Sulfa antibiotics    Social History:  The patient  reports that she has been smoking cigarettes. She has a 17.00 pack-year smoking history. She has never used smokeless tobacco. She reports current alcohol use. She reports that she does not use drugs.   Family History:  The patient's family history includes Anemia in her sister; Diabetes in her maternal grandfather, maternal grandmother, and paternal grandmother; Healthy in her father; Heart failure in her maternal grandmother; Hypertension in her maternal grandmother; Lupus in her mother; Thyroid disease in her maternal grandmother and sister.    ROS: All other systems are reviewed and negative. Unless otherwise mentioned in H&P    PHYSICAL EXAM: VS:  BP (!) 128/92   Pulse 62   Ht 5\' 5"  (1.651 m)   Wt 194 lb 3.2 oz (88.1  kg)   BMI 32.32 kg/m  , BMI Body mass index is 32.32 kg/m. GEN: Well nourished, well developed, in no acute distress HEENT: normal Neck: no JVD, carotid bruits, or masses Cardiac: RRR; no murmurs, rubs, or gallops,no edema  Respiratory:  Clear to auscultation bilaterally, normal work of breathing GI: soft, nontender, nondistended, + BS MS: no deformity or atrophy Skin: warm and dry, no rash Neuro:  Strength and sensation are intact Psych: euthymic mood, full affect   EKG: Normal sinus rhythm with possible left atrial enlargement.   Heart rate of 62 bpm  Recent Labs: 08/30/2019: BUN 10; Creatinine, Ser 0.79; Hemoglobin 13.0; Platelets 271; Potassium 3.7; Sodium 136    Lipid Panel    Component Value Date/Time   CHOL 143 07/28/2016 1119   TRIG 45.0 07/28/2016 1119   HDL 63.60 07/28/2016 1119   CHOLHDL 2 07/28/2016 1119   VLDL 9.0 07/28/2016 1119   LDLCALC 71 07/28/2016 1119      Wt Readings from Last 3 Encounters:  09/03/19 194 lb 3.2 oz (88.1 kg)  08/30/19 191 lb 9.6 oz (86.9 kg)  04/18/18 191 lb (86.6 kg)      Other studies Reviewed: None completed  ASSESSMENT AND PLAN:  1.  Dizziness: Usually associated with irregular heart rate and palpitations.  She also feels panicky when this occurs.  She has been placed on meclizine which is been unhelpful.  I am going to check an echocardiogram for evaluation of LV function, structural heart disease, or PFO.  I have reviewed her labs from the emergency room, and she does not have evidence of anemia.  2.  Frequent palpitations she has feelings of her heart racing, and then slowing down: My plan is to place a 1 week ZIO real-time cardiac monitor for evaluation of heart rhythm, morphology of arrhythmias, and overall rate for 1 week.  She is advised that she can trigger the device if she is feeling dizziness racing heart rate or slow heart rate or frequent palpitations.  I am going to check a TSH, and a hemoglobin A1c due to family history of thyroid disease and diabetes.  3.  Hypertension: Elevated on today's visit.  She did have orthostatics completed which were negative.  Heart rate did not show bradycardia or tachycardia with position change.   Current medicines are reviewed at length with the patient today.  We will not sign off from a cardiac standpoint toward DOT physical until full cardiac evaluation is completed.  She states that she is overdue to have her DOT physical but I would like to evaluate her better concerning her symptoms.  She is given a letter to avoid  driving for the next couple of weeks while we are completing our work-up.  She will follow-up with Dr. Chilton Si to be acquainted with her cardiologist for ongoing assessment and management and discussion of test results.  Labs/ tests ordered today include: TSH, hemoglobin A1c, echocardiogram, 1 week real-time cardiac monitor.  Bettey Mare. Liborio Nixon, ANP, AACC   09/03/2019 5:04 PM    [facility] [address] Office [phone] Fax [phone]  Notice: This dictation was prepared with Dragon dictation along with smaller phrase technology. Any transcriptional errors that result from this process are unintentional and may not be corrected upon review.

## 2019-09-03 NOTE — Progress Notes (Deleted)
Error

## 2019-09-05 DIAGNOSIS — R000 Tachycardia, unspecified: Secondary | ICD-10-CM | POA: Diagnosis not present

## 2019-09-05 DIAGNOSIS — Z79899 Other long term (current) drug therapy: Secondary | ICD-10-CM | POA: Diagnosis not present

## 2019-09-05 DIAGNOSIS — R001 Bradycardia, unspecified: Secondary | ICD-10-CM | POA: Diagnosis not present

## 2019-09-05 DIAGNOSIS — R42 Dizziness and giddiness: Secondary | ICD-10-CM | POA: Diagnosis not present

## 2019-09-06 ENCOUNTER — Other Ambulatory Visit (INDEPENDENT_AMBULATORY_CARE_PROVIDER_SITE_OTHER): Payer: BC Managed Care – PPO

## 2019-09-06 DIAGNOSIS — R001 Bradycardia, unspecified: Secondary | ICD-10-CM

## 2019-09-06 DIAGNOSIS — J189 Pneumonia, unspecified organism: Secondary | ICD-10-CM

## 2019-09-06 DIAGNOSIS — R42 Dizziness and giddiness: Secondary | ICD-10-CM

## 2019-09-06 DIAGNOSIS — R000 Tachycardia, unspecified: Secondary | ICD-10-CM | POA: Diagnosis not present

## 2019-09-06 LAB — HEMOGLOBIN A1C
Est. average glucose Bld gHb Est-mCnc: 105 mg/dL
Hgb A1c MFr Bld: 5.3 % (ref 4.8–5.6)

## 2019-09-06 LAB — TSH: TSH: 0.448 u[IU]/mL — ABNORMAL LOW (ref 0.450–4.500)

## 2019-09-10 ENCOUNTER — Telehealth: Payer: Self-pay | Admitting: *Deleted

## 2019-09-10 NOTE — Telephone Encounter (Signed)
7 day ZIO XT long term holter monitor to be mailed to the patients home.  Instructions reviewed briefly as they are included in the monitor kit.

## 2019-09-11 ENCOUNTER — Other Ambulatory Visit: Payer: Self-pay

## 2019-09-11 ENCOUNTER — Ambulatory Visit (HOSPITAL_COMMUNITY): Payer: BC Managed Care – PPO | Attending: Cardiology

## 2019-09-11 DIAGNOSIS — R000 Tachycardia, unspecified: Secondary | ICD-10-CM | POA: Diagnosis not present

## 2019-09-12 ENCOUNTER — Ambulatory Visit: Payer: BC Managed Care – PPO | Admitting: Cardiovascular Disease

## 2019-09-17 ENCOUNTER — Ambulatory Visit (INDEPENDENT_AMBULATORY_CARE_PROVIDER_SITE_OTHER): Payer: BC Managed Care – PPO

## 2019-09-17 DIAGNOSIS — R001 Bradycardia, unspecified: Secondary | ICD-10-CM | POA: Diagnosis not present

## 2019-09-17 DIAGNOSIS — R42 Dizziness and giddiness: Secondary | ICD-10-CM | POA: Diagnosis not present

## 2019-10-06 NOTE — Progress Notes (Signed)
[facility] [address] [place], [place] [address] Phone: [phone] Subjective:   [name], am serving as a scribe for Dr. Hulan Saas.   CC: Bilateral knee pain  HFW:[id number]  [name] is a 43 y.o. female coming in with complaint of chronic bilateral knee and leg pain. Surgery on right knee in 2009. Was told she has cysts on back of both knees. Pain throughout the entire knee. Pain is constant. Unable to sleep due to pain. Uses IBU prn.   Patient also complains chronic anterior tibia pain. Pain increases when she is on her feet. Pain feels stabbing and her legs can go numb when she lays down.      Past Medical History:  Diagnosis Date  . Iron deficiency anemia   . Migraine   . Sarcoidosis   . Vertigo    Past Surgical History:  Procedure Laterality Date  . CESAREAN SECTION    . CESAREAN SECTION    . KNEE SURGERY     Social History   Socioeconomic History  . Marital status: Married    Spouse name: Not on file  . Number of children: 3  . Years of education: 73  . Highest education level: Not on file  Occupational History  . Occupation: Diplomatic Services operational officer  . Financial resource strain: Not on file  . Food insecurity    Worry: Not on file    Inability: Not on file  . Transportation needs    Medical: Not on file    Non-medical: Not on file  Tobacco Use  . Smoking status: Current Every Day Smoker    Packs/day: 1.00    Years: 17.00    Pack years: 17.00    Types: Cigarettes  . Smokeless tobacco: Never Used  Substance and Sexual Activity  . Alcohol use: Yes    Comment: Occasionally - holidays  . Drug use: No  . Sexual activity: Not on file  Lifestyle  . Physical activity    Days per week: Not on file    Minutes per session: Not on file  . Stress: Not on file  Relationships  . Social Herbalist on phone: Not on file    Gets together: Not on file    Attends religious service: Not on file    Active  member of club or organization: Not on file    Attends meetings of clubs or organizations: Not on file    Relationship status: Not on file  Other Topics Concern  . Not on file  Social History Narrative   Fun: Dancing   Denies abuse and feels safe at home   Allergies  Allergen Reactions  . Fish Allergy Anaphylaxis and Shortness Of Breath  . Naprosyn [Naproxen] Anxiety and Other (See Comments)    Vaginal bleeding and anxiety  . Sulfa Antibiotics Anxiety and Other (See Comments)    Vaginal bleeding and anxiety   Family History  Problem Relation Age of Onset  . Lupus Mother   . Healthy Father   . Heart failure Maternal Grandmother   . Diabetes Maternal Grandmother   . Hypertension Maternal Grandmother   . Thyroid disease Maternal Grandmother   . Diabetes Maternal Grandfather   . Diabetes Paternal Grandmother   . Thyroid disease Sister   . Anemia Sister          Current Outpatient Medications (Other):  Marland Kitchen  Diclofenac Sodium (PENNSAID) 2 % SOLN, Place 1  application onto the skin 2 (two) times daily. (Patient taking differently: Place 1 application onto the skin 2 (two) times daily as needed (Dry skin). ) .  meclizine (ANTIVERT) 25 MG tablet, Take 1 tablet (25 mg total) by mouth 3 (three) times daily as needed for dizziness. .  Vitamin D, Ergocalciferol, (DRISDOL) 1.25 MG (50000 UT) CAPS capsule, Take 1 capsule (50,000 Units total) by mouth every 7 (seven) days.    Past medical history, social, surgical and family history all reviewed in electronic medical record.  No pertanent information unless stated regarding to the chief complaint.   Review of Systems:  No headache, visual changes, nausea, vomiting, diarrhea, constipation, dizziness, abdominal pain, skin rash, fevers, chills, night sweats, weight loss, swollen lymph nodes, body aches,chest pain, shortness of breath, mood changes.  Positive muscle aches and joint swelling  Objective  Blood pressure 116/80, height 5\' 5"   (1.651 m), weight 198 lb (89.8 kg).    General: No apparent distress alert and oriented x3 mood and affect normal, dressed appropriately.  HEENT: Pupils equal, extraocular movements intact  Respiratory: Patient's speak in full sentences and does not appear short of breath  Cardiovascular: No lower extremity edema, non tender, no erythema  Skin: Warm dry intact with no signs of infection or rash on extremities or on axial skeleton.  Abdomen: Soft nontender  Neuro: Cranial nerves II through XII are intact, neurovascularly intact in all extremities with 2+ DTRs and 2+ pulses.  Lymph: No lymphadenopathy of posterior or anterior cervical chain or axillae bilaterally.  Gait normal with good balance and coordination.  MSK:  Non tender with full range of motion and good stability and symmetric strength and tone of shoulders, elbows, wrist, hip, and ankles bilaterally.   Knee: Bilateral valgus deformity noted. Large thigh to calf ratio.  Right greater than left Tender to palpation over medial and PF joint line.  ROM full in flexion and extension and lower leg rotation. instability with valgus force.  Instability is only on the right knee painful patellar compression. Patellar glide with moderate crepitus. Patellar and quadriceps tendons unremarkable. Hamstring and quadriceps strength is normal.  Limited musculoskeletal ultrasound was performed and interpreted by  Limited ultrasound of patient's right knee shows the patient does have an effusion noted.  Moderate narrowing of the patellofemoral joint and moderate to severe narrowing of the medial joint space.  No cyst formation noted.  Degenerative changes of the medial and lateral meniscus.  MCL appears to be intact. Impression: Arthritic changes with knee effusion   After informed written and verbal consent, patient was seated on exam table. Right knee was prepped with alcohol swab and utilizing anterolateral approach, patient's  right knee space was injected with 4:1  marcaine 0.5%: Kenalog 40mg /dL. Patient tolerated the procedure well without immediate complications.  After informed written and verbal consent, patient was seated on exam table. Left knee was prepped with alcohol swab and utilizing anterolateral approach, patient's left knee space was injected with 4:1  marcaine 0.5%: Kenalog 40mg /dL. Patient tolerated the procedure well without immediate complications.    Impression and Recommendations:     This case required medical decision making of moderate complexity. The above documentation has been reviewed and is accurate and complete Judi Saa, DO       Note: This dictation was prepared with Dragon dictation along with smaller phrase technology. Any transcriptional errors that result from this process are unintentional.

## 2019-10-07 ENCOUNTER — Other Ambulatory Visit: Payer: Self-pay

## 2019-10-07 ENCOUNTER — Ambulatory Visit: Payer: Self-pay

## 2019-10-07 ENCOUNTER — Ambulatory Visit (INDEPENDENT_AMBULATORY_CARE_PROVIDER_SITE_OTHER): Payer: BC Managed Care – PPO | Admitting: Family Medicine

## 2019-10-07 ENCOUNTER — Encounter: Payer: Self-pay | Admitting: Family Medicine

## 2019-10-07 VITALS — BP 116/80 | Ht 65.0 in | Wt 198.0 lb

## 2019-10-07 DIAGNOSIS — G8929 Other chronic pain: Secondary | ICD-10-CM

## 2019-10-07 DIAGNOSIS — M25561 Pain in right knee: Secondary | ICD-10-CM

## 2019-10-07 DIAGNOSIS — M25562 Pain in left knee: Secondary | ICD-10-CM

## 2019-10-07 DIAGNOSIS — M170 Bilateral primary osteoarthritis of knee: Secondary | ICD-10-CM | POA: Diagnosis not present

## 2019-10-07 MED ORDER — VITAMIN D (ERGOCALCIFEROL) 1.25 MG (50000 UNIT) PO CAPS: 50000.0000 [IU] | ORAL_CAPSULE | ORAL | 0 refills | Status: DC

## 2019-10-07 NOTE — Patient Instructions (Addendum)
Good to see you.  Ice 20 minutes 2 times daily. Usually after activity and before bed. pennsaid pinkie amount topically 2 times daily as needed.  Once weekly vitamin D for 12 weeks.  Turmeric 500mg  daily  Tart cherry extract 1200mg  at night Spenco orthotics "total support" See me again in 5-6 weeks

## 2019-10-07 NOTE — Assessment & Plan Note (Addendum)
Bilateral knee x-rays ordered, bilateral knee injections given today.  Discussed icing regimen, topical anti-inflammatories given, once weekly vitamin D given, discussed which activities to do which wants to avoid.  Patient will increase activity slowly over the course of next several weeks.  Follow-up with me again in 4 to 8 weeks worsening pain could be a candidate for viscosupplementation.  Due to the abnormal calf to thigh ratio as well as instability of the right knee I would like patient to be fitted for a custom brace.

## 2019-10-08 NOTE — Progress Notes (Deleted)
Cardiology Office Note   Date:  10/08/2019   ID:  EURYDICE CALIXTO, DOB [birth year], MRN [id number]  PCP:  Evaristo Bury, NP  Cardiologist:   No chief complaint on file.    History of Present Illness: [name] is a 43 y.o. female who presents for new patient cardiac evaluation with symptoms of bradycardia and dizziness.  She was seen in the emergency room on 08/30/2019, with similar complaints.  The patient stated that the dizziness was worse when she turned her head.  Heart rate was found to be in the 50s per primary care physician.  EKG revealed normal sinus rhythm heart rate in the 60s there was no evidence of heart block or junctional rhythm.  [name] has a history of sinusitis, vitamin D deficiency, lupus, ongoing tobacco abuse, thrombocytopenia, and prior history of community-acquired pneumonia in 2015.  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  She was seen last 09/03/2019 complaining of dizziness. I ordered an echocardiogram and a one week Zio real time cardiac monitor.    Past Medical History:  Diagnosis Date   Iron deficiency anemia    Migraine    Sarcoidosis    Vertigo     Past Surgical History:  Procedure Laterality Date   CESAREAN SECTION     CESAREAN SECTION     KNEE SURGERY       Current Outpatient Medications  Medication Sig Dispense Refill   Diclofenac Sodium (PENNSAID) 2 % SOLN Place 1 application onto the skin 2 (two) times daily. (Patient taking differently: Place 1 application onto the skin 2 (two) times daily as needed (Dry skin). ) 1 Bottle 3   meclizine (ANTIVERT) 25 MG tablet Take 1 tablet (25 mg total) by mouth 3 (three) times daily as needed for dizziness. 20 tablet 0   Vitamin D, Ergocalciferol, (DRISDOL) 1.25 MG (50000 UT) CAPS capsule Take 1 capsule (50,000 Units total) by mouth every 7 (seven)  days. 12 capsule 0   No current facility-administered medications for this visit.     Allergies:   Fish allergy, Naprosyn [naproxen], and Sulfa antibiotics    Social History:  The patient  reports that she has been smoking cigarettes. She has a 17.00 pack-year smoking history. She has never used smokeless tobacco. She reports current alcohol use. She reports that she does not use drugs.   Family History:  The patient's family history includes Anemia in her sister; Diabetes in her maternal grandfather, maternal grandmother, and paternal grandmother; Healthy in her father; Heart failure in her maternal grandmother; Hypertension in her maternal grandmother; Lupus in her mother; Thyroid disease in her maternal grandmother and sister.    ROS: All other systems are reviewed and negative. Unless otherwise mentioned in H&P    PHYSICAL EXAM: VS:  There were no vitals taken for this visit. , BMI There is no height or weight on file to calculate BMI. GEN: Well nourished, well developed, in no acute distress HEENT: normal Neck: no JVD, carotid bruits, or masses Cardiac: ***RRR; no murmurs, rubs, or gallops,no edema  Respiratory:  Clear to auscultation bilaterally, normal work of breathing GI: soft, nontender, nondistended, + BS MS: no deformity or atrophy Skin: warm and dry, no rash Neuro:  Strength and sensation are intact Psych: euthymic mood, full affect   EKG:  EKG {ACTION; IS/IS LEX:[id number]} ordered today. The  ekg ordered today demonstrates ***   Recent Labs: 08/30/2019: BUN 10; Creatinine, Ser 0.79; Hemoglobin 13.0; Platelets 271; Potassium 3.7; Sodium 136 09/05/2019: TSH 0.448    Lipid Panel    Component Value Date/Time   CHOL 143 07/28/2016 1119   TRIG 45.0 07/28/2016 1119   HDL 63.60 07/28/2016 1119   CHOLHDL 2 07/28/2016 1119   VLDL 9.0 07/28/2016 1119   LDLCALC 71 07/28/2016 1119      Wt Readings from Last 3 Encounters:  10/07/19 198 lb (89.8 kg)  09/03/19 194 lb  3.2 oz (88.1 kg)  08/30/19 191 lb 9.6 oz (86.9 kg)      Other studies Reviewed:\ Echocardiogram 09/11/2019  1. Left ventricular ejection fraction, by visual estimation, is 55 to 60%. The left ventricle has normal function. Normal left ventricular size. There is no left ventricular hypertrophy. Normal wall motion. Normal diastolic function.  2. Global right ventricle has normal systolic function.The right ventricular size is normal. No increase in right ventricular wall thickness.  3. Left atrial size was normal.  4. Right atrial size was normal.  5. The mitral valve is normal in structure. Trace mitral valve regurgitation. No evidence of mitral stenosis.  6. The tricuspid valve is normal in structure. Tricuspid valve regurgitation is trivial.  7. The aortic valve is normal in structure. Aortic valve regurgitation was not visualized by color flow Doppler. Structurally normal aortic valve, with no evidence of sclerosis or stenosis.  8. The tricuspid regurgitant velocity is 2.16 m/s, and with an assumed right atrial pressure of 3 mmHg, the estimated right ventricular systolic pressure is normal at 21.7 mmHg.  9. The inferior vena cava is normal in size with greater than 50% respiratory variability, suggesting right atrial pressure of 3 mmHg.   ASSESSMENT AND PLAN:  1.  ***   Current medicines are reviewed at length with the patient today.    Labs/ tests ordered today include: *** Phill Myron. West Pugh, ANP, AACC   10/08/2019 1:39 PM    [facility] [place] [address] Office [phone] Fax [phone]  Notice: This dictation was prepared with Dragon dictation along with smaller phrase technology. Any transcriptional errors that result from this process are unintentional and may not be corrected upon review.

## 2019-10-09 ENCOUNTER — Ambulatory Visit: Payer: BC Managed Care – PPO | Admitting: Adult Health

## 2019-10-23 ENCOUNTER — Encounter (HOSPITAL_BASED_OUTPATIENT_CLINIC_OR_DEPARTMENT_OTHER): Payer: Self-pay

## 2019-10-23 ENCOUNTER — Emergency Department (HOSPITAL_BASED_OUTPATIENT_CLINIC_OR_DEPARTMENT_OTHER)
Admission: EM | Admit: 2019-10-23 | Discharge: 2019-10-23 | Disposition: A | Discharge status: Home / Self Care | Payer: BC Managed Care – PPO | Attending: Emergency Medicine | Admitting: Emergency Medicine

## 2019-10-23 ENCOUNTER — Other Ambulatory Visit: Payer: Self-pay

## 2019-10-23 ENCOUNTER — Emergency Department (HOSPITAL_BASED_OUTPATIENT_CLINIC_OR_DEPARTMENT_OTHER): Payer: BC Managed Care – PPO

## 2019-10-23 DIAGNOSIS — M79602 Pain in left arm: Secondary | ICD-10-CM

## 2019-10-23 DIAGNOSIS — I10 Essential (primary) hypertension: Secondary | ICD-10-CM | POA: Diagnosis not present

## 2019-10-23 DIAGNOSIS — F17210 Nicotine dependence, cigarettes, uncomplicated: Secondary | ICD-10-CM | POA: Insufficient documentation

## 2019-10-23 DIAGNOSIS — Z886 Allergy status to analgesic agent status: Secondary | ICD-10-CM | POA: Insufficient documentation

## 2019-10-23 DIAGNOSIS — R42 Dizziness and giddiness: Secondary | ICD-10-CM | POA: Insufficient documentation

## 2019-10-23 DIAGNOSIS — Z91018 Allergy to other foods: Secondary | ICD-10-CM | POA: Insufficient documentation

## 2019-10-23 DIAGNOSIS — Z882 Allergy status to sulfonamides status: Secondary | ICD-10-CM | POA: Insufficient documentation

## 2019-10-23 DIAGNOSIS — R079 Chest pain, unspecified: Secondary | ICD-10-CM | POA: Diagnosis not present

## 2019-10-23 LAB — CBC WITH DIFFERENTIAL/PLATELET
Abs Immature Granulocytes: 0.01 10*3/uL (ref 0.00–0.07)
Basophils Absolute: 0 10*3/uL (ref 0.0–0.1)
Basophils Relative: 0 %
Eosinophils Absolute: 0 10*3/uL (ref 0.0–0.5)
Eosinophils Relative: 0 %
HCT: 39.3 % (ref 36.0–46.0)
Hemoglobin: 12.3 g/dL (ref 12.0–15.0)
Immature Granulocytes: 0 %
Lymphocytes Relative: 24 %
Lymphs Abs: 1.1 10*3/uL (ref 0.7–4.0)
MCH: 28.9 pg (ref 26.0–34.0)
MCHC: 31.3 g/dL (ref 30.0–36.0)
MCV: 92.5 fL (ref 80.0–100.0)
Monocytes Absolute: 0.5 10*3/uL (ref 0.1–1.0)
Monocytes Relative: 11 %
Neutro Abs: 3 10*3/uL (ref 1.7–7.7)
Neutrophils Relative %: 65 %
Platelets: 271 10*3/uL (ref 150–400)
RBC: 4.25 MIL/uL (ref 3.87–5.11)
RDW: 13.2 % (ref 11.5–15.5)
WBC: 4.7 10*3/uL (ref 4.0–10.5)
nRBC: 0 % (ref 0.0–0.2)

## 2019-10-23 LAB — BASIC METABOLIC PANEL
Anion gap: 7 (ref 5–15)
BUN: 8 mg/dL (ref 6–20)
CO2: 24 mmol/L (ref 22–32)
Calcium: 8.9 mg/dL (ref 8.9–10.3)
Chloride: 104 mmol/L (ref 98–111)
Creatinine, Ser: 0.63 mg/dL (ref 0.44–1.00)
GFR calc Af Amer: >60 mL/min (ref >60)
GFR calc non Af Amer: >60 mL/min (ref >60)
Glucose, Bld: 91 mg/dL (ref 70–99)
Potassium: 4.2 mmol/L (ref 3.5–5.1)
Sodium: 135 mmol/L (ref 135–145)

## 2019-10-23 LAB — TROPONIN I (HIGH SENSITIVITY): Troponin I (High Sensitivity): <2 ng/L (ref <18)

## 2019-10-23 NOTE — Progress Notes (Deleted)
{Choose 1 Note Type (Telehealth Visit or Telephone Visit):[phone]}   Date:  10/23/2019   ID:  [name], DOB 03-24-1976, MRN [id number]  {Patient Location:[phone]::"Home"} {Provider Location:([phone]::"Home"}  PCP:  Lance Sell, NP  Cardiologist:  Evalina Field, MD  Electrophysiologist:  None   Evaluation Performed:  {Choose Visit Type:[id number]::"Follow-Up Visit"}  Chief Complaint:  ***  History of Present Illness:    [name] is a 43 y.o. female we are following for ongoing assessment of symptomatic bradycardia and dizziness. The patient stated that the dizziness was worse when she turned her head.  Heart rate was found to be in the 50s per primary care physician.  EKG revealed normal sinus rhythm heart rate in the 60s there was no evidence of heart block or junctional rhythm.  She states that she feels frequent palpitations and dizziness which occur daily on and off throughout the day.  She sometimes feels as if she is having "a panic attack" while she is driving and she has to pull over because she feels so badly and her heart is skipping was associated trouble taking deep breaths.  She is felt no improvement of her symptoms on meclizine.  She does not drink a lot of caffeine.  She denies associated chest pain, or near syncope associated with the palpitations and dizziness.  I ordered an echocardiogram for evaluation of LV function and Zio monitor for evaluation of cardiac arrhythmias causing her symptoms. Orthostatic BP' were negative.   Echo demonstrated normal heart function with EF of 55%-60%.  No valvular abnormalities.   The patient {does/does not:200015} have symptoms concerning for COVID-19 infection (fever, chills, cough, or new shortness of breath).    Past Medical History:  Diagnosis Date  . Iron deficiency anemia   . Migraine   . Sarcoidosis   . Vertigo    Past Surgical History:  Procedure Laterality Date  . CESAREAN SECTION    .  CESAREAN SECTION    . KNEE SURGERY       No outpatient medications have been marked as taking for the 10/24/19 encounter (Appointment) with Lendon Colonel, NP.     Allergies:   Fish allergy, Naprosyn [naproxen], and Sulfa antibiotics   Social History   Tobacco Use  . Smoking status: Current Every Day Smoker    Packs/day: 1.00    Years: 17.00    Pack years: 17.00    Types: Cigarettes  . Smokeless tobacco: Never Used  Substance Use Topics  . Alcohol use: Yes    Comment: Occasionally - holidays  . Drug use: No     Family Hx: The patient's family history includes Anemia in her sister; Diabetes in her maternal grandfather, maternal grandmother, and paternal grandmother; Healthy in her father; Heart failure in her maternal grandmother; Hypertension in her maternal grandmother; Lupus in her mother; Thyroid disease in her maternal grandmother and sister.  ROS:   Please see the history of present illness.    *** All other systems reviewed and are negative.   Prior CV studies:   The following studies were reviewed today: . Left ventricular ejection fraction, by visual estimation, is 55 to 60%. The left ventricle has normal function. Normal left ventricular size. There is no left ventricular hypertrophy. Normal wall motion. Normal diastolic function.  2. Global right ventricle has normal systolic function.The right ventricular size is normal. No increase in right ventricular wall thickness.  3. Left atrial size was normal.  4. Right atrial size was normal.  5. The mitral valve is normal in structure. Trace mitral valve regurgitation. No evidence of mitral stenosis.  6. The tricuspid valve is normal in structure. Tricuspid valve regurgitation is trivial.  7. The aortic valve is normal in structure. Aortic valve regurgitation was not visualized by color flow Doppler. Structurally normal aortic valve, with no evidence of sclerosis or stenosis.  8. The tricuspid regurgitant velocity is  2.16 m/s, and with an assumed right atrial pressure of 3 mmHg, the estimated right ventricular systolic pressure is normal at 21.7 mmHg.  9. The inferior vena cava is normal in size with greater than 50% respiratory variability, suggesting right atrial pressure of 3 mmHg.  Labs/Other Tests and Data Reviewed:    EKG:  {EKG/Telemetry Strips Reviewed:[phone]}  Recent Labs: 08/30/2019: BUN 10; Creatinine, Ser 0.79; Hemoglobin 13.0; Platelets 271; Potassium 3.7; Sodium 136 09/05/2019: TSH 0.448   Recent Lipid Panel Lab Results  Component Value Date/Time   CHOL 143 07/28/2016 11:19 AM   TRIG 45.0 07/28/2016 11:19 AM   HDL 63.60 07/28/2016 11:19 AM   CHOLHDL 2 07/28/2016 11:19 AM   LDLCALC 71 07/28/2016 11:19 AM    Wt Readings from Last 3 Encounters:  10/07/19 198 lb (89.8 kg)  09/03/19 194 lb 3.2 oz (88.1 kg)  08/30/19 191 lb 9.6 oz (86.9 kg)     Objective:    Vital Signs:  There were no vitals taken for this visit.   {[facility] Virtual Exam (Optional):[phone]::"VITAL SIGNS:  reviewed"}  ASSESSMENT & PLAN:    1. ***  COVID-19 Education: The signs and symptoms of COVID-19 were discussed with the patient and how to seek care for testing (follow up with PCP or arrange E-visit).  ***The importance of social distancing was discussed today.  Time:   Today, I have spent *** minutes with the patient with telehealth technology discussing the above problems.     Medication Adjustments/Labs and Tests Ordered: Current medicines are reviewed at length with the patient today.  Concerns regarding medicines are outlined above.   Tests Ordered: No orders of the defined types were placed in this encounter.   Medication Changes: No orders of the defined types were placed in this encounter.   Disposition:  Follow up {follow up:15908}  Signed, Bettey Mare. Liborio Nixon, ANP, AACC  10/23/2019 8:00 AM    [facility]

## 2019-10-23 NOTE — ED Notes (Signed)
IV x1 attempt right AC, unable to thread.

## 2019-10-23 NOTE — Discharge Instructions (Signed)
You are seen in the emergency department today with elevated blood pressure and left arm discomfort.  We did labs looking at your heart and those were normal.  Please follow-up with your nose and throat regarding your dizziness.  Please review the information regarding the DASH diet for control of your high blood pressure.  Discussed with your PCP if you need to go on medications.  Return to the emergency department with any new or suddenly worsening symptoms.

## 2019-10-23 NOTE — ED Triage Notes (Signed)
Pt states her PCP is watching her BP x 1 month-she is seeing cards for c/co dizziness,vertigo and low heart-no new rx meds by either MD-pt c/o tightness to left UE x 2 days-NAD-steady gait

## 2019-10-23 NOTE — ED Provider Notes (Signed)
Emergency Department Provider Note   I have reviewed the triage vital signs and the nursing notes.   HISTORY  Chief Complaint Hypertension   HPI [name] is a 43 y.o. female with PMH reviewed below presents to the emergency department for evaluation of intermittent vertigo symptoms with left arm tightness and elevated blood pressure at home.  Patient states she is been monitoring her blood pressure at home over the past month with readings into the systolic 150s.  She denies chest pain, shortness of breath, heart palpitations.  She has had some left arm tightness intermittently over the past 2 days but this does not radiate to her neck or chest.  She denies any fevers or chills.  No back or abdominal pain.  She notes intermittent vertigo and has been referred to cardiology and has finished an ambulatory monitor.  She has a telehealth visit tomorrow to discuss the results of that.  She is not currently on blood pressure medication but continues to follow with her primary care doctor.  She denies headache or vision changes.  No weakness or numbness in the extremities.  No difficulty walking unless she is having an episode of vertigo. None currently.   Past Medical History:  Diagnosis Date   Iron deficiency anemia    Migraine    Sarcoidosis    Vertigo     Patient Active Problem List   Diagnosis Date Noted   Degenerative arthritis of knee, bilateral 10/07/2019   Suspected COVID-19 virus infection 05/07/2019   Chronic pain of left ankle 04/13/2018   Sinusitis 11/25/2016   Vitamin D deficiency 07/28/2016   Routine general medical examination at a health care facility 04/25/2016   Obesity 04/25/2016   Tobacco abuse 04/25/2016   Community acquired pneumonia 09/28/2014   Anemia 09/28/2014   Menorrhagia 09/28/2014   Thrombocytopenia (HCC) 09/28/2014   CAP (community acquired pneumonia) 09/28/2014    Past Surgical History:  Procedure Laterality Date    CESAREAN SECTION     CESAREAN SECTION     KNEE SURGERY     TUBAL LIGATION      Allergies Fish allergy, Naprosyn [naproxen], and Sulfa antibiotics  Family History  Problem Relation Age of Onset   Lupus Mother    Healthy Father    Heart failure Maternal Grandmother    Diabetes Maternal Grandmother    Hypertension Maternal Grandmother    Thyroid disease Maternal Grandmother    Diabetes Maternal Grandfather    Diabetes Paternal Grandmother    Thyroid disease Sister    Anemia Sister     Social History Social History   Tobacco Use   Smoking status: Current Every Day Smoker    Packs/day: 1.00    Years: 17.00    Pack years: 17.00    Types: Cigarettes   Smokeless tobacco: Never Used  Substance Use Topics   Alcohol use: Yes    Comment: occ   Drug use: No    Review of Systems  Constitutional: No fever/chills Eyes: No visual changes. ENT: No sore throat. Positive intermittent vertigo.  Cardiovascular: Denies chest pain. Respiratory: Denies shortness of breath. Gastrointestinal: No abdominal pain.  No nausea, no vomiting.  No diarrhea.  No constipation. Genitourinary: Negative for dysuria. Musculoskeletal: Negative for back pain. Positive left arm tightness.  Skin: Negative for rash. Neurological: Negative for headaches, focal weakness or numbness.  10-point ROS otherwise negative.  ____________________________________________   PHYSICAL EXAM:  VITAL SIGNS: ED Triage Vitals  Enc Vitals Group  BP 10/23/19 1444 (!) 152/100     Pulse Rate 10/23/19 1444 65     Resp 10/23/19 1444 18     Temp 10/23/19 1444 98.6 F (37 C)     Temp Source 10/23/19 1444 Oral     SpO2 10/23/19 1444 100 %     Weight 10/23/19 1445 196 lb (88.9 kg)     Height 10/23/19 1445 5\' 5"  (1.651 m)   Constitutional: Alert and oriented. Well appearing and in no acute distress. Eyes: Conjunctivae are normal. PERRL.  Head: Atraumatic. Ears:  Healthy appearing ear canals and TMs  bilaterally Nose: No congestion/rhinnorhea. Mouth/Throat: Mucous membranes are moist.   Neck: No stridor.   Cardiovascular: Normal rate, regular rhythm. Good peripheral circulation. Grossly normal heart sounds.   Respiratory: Normal respiratory effort.  No retractions. Lungs CTAB. Gastrointestinal: Soft and nontender. No distention.  Musculoskeletal: No lower extremity tenderness nor edema. No gross deformities of extremities. Neurologic:  Normal speech and language. No gross focal neurologic deficits are appreciated.  Cranial nerve exam 2 through 12 normal.  Normal finger-to-nose testing.  No pronator drift.  No heel to shin abnormality.  Skin:  Skin is warm, dry and intact. No rash noted.   ____________________________________________   LABS (all labs ordered are listed, but only abnormal results are displayed)  Labs Reviewed  BASIC METABOLIC PANEL  CBC WITH DIFFERENTIAL/PLATELET  TROPONIN I (HIGH SENSITIVITY)  TROPONIN I (HIGH SENSITIVITY)   ____________________________________________  EKG   EKG Interpretation  Date/Time:  Wednesday October 23 2019 14:41:44 EST Ventricular Rate:  60 PR Interval:  174 QRS Duration: 72 QT Interval:  436 QTC Calculation: 436 R Axis:   20 Text Interpretation: Normal sinus rhythm Normal ECG No STEMI Confirmed by Nanda Quinton [phone]) on 10/23/2019 3:09:59 PM       ____________________________________________  RADIOLOGY  Dg Chest Portable 1 View  Result Date: 10/23/2019 CLINICAL DATA:  Chest pain. Additional history provided: Dizziness, vertigo, tightness left upper extremity for 2 days. EXAM: PORTABLE CHEST 1 VIEW COMPARISON:  Chest radiograph 11/20/2017 FINDINGS: Heart size within normal limits. There is no airspace consolidation within the lungs. No evidence of pleural effusion or pneumothorax. No acute bony abnormality. Overlying cardiac monitoring leads. IMPRESSION: No evidence of acute cardiopulmonary abnormality. Electronically  Signed   By: Kellie Simmering DO   On: 10/23/2019 15:41    ____________________________________________   PROCEDURES  Procedure(s) performed:   Procedures  None  ____________________________________________   INITIAL IMPRESSION / ASSESSMENT AND PLAN / ED COURSE  Pertinent labs & imaging results that were available during my care of the patient were reviewed by me and considered in my medical decision making (see chart for details).   Patient presents to the emergency department with intermittent left arm tightness with intermittent vertigo symptoms.  Blood pressure here is mildly elevated but not severe.  I do not appreciate any evidence on exam to suspect acute hypertensive emergency.  Left arm tightness could be an anginal equivalent.  I have EKG as above with no acute ischemic findings.  Plan for troponins, screening lab work.  Will give patient dietary guidelines and sodium goals to help control blood pressure without medications.  She has follow-up with cardiology tomorrow.  Her exam is not consistent with central vertigo.  I do not feel she needs emergency imaging of the head.  She may benefit from ENT follow-up and vestibular rehab.  04:40 PM  Labs and imaging reviewed with no acute findings.  Patient given contact information for  ENT regarding her likely peripheral vertigo.  Discussed the DASH diet and PCP/cardiology follow-up plan.  Return to the emergency department with any new or worsening symptoms.  ____________________________________________  FINAL CLINICAL IMPRESSION(S) / ED DIAGNOSES  Final diagnoses:  Essential hypertension  Vertigo  Left arm pain    Note:  This document was prepared using Dragon voice recognition software and may include unintentional dictation errors.  Alona Bene, MD, [facility] Emergency Medicine    Burton Gahan, Arlyss Repress, MD 10/23/19 [phone]

## 2019-10-24 ENCOUNTER — Telehealth: Payer: Self-pay

## 2019-10-24 ENCOUNTER — Telehealth: Payer: BC Managed Care – PPO | Admitting: Adult Health

## 2019-10-24 ENCOUNTER — Telehealth: Payer: Self-pay | Admitting: Adult Health

## 2019-10-24 NOTE — Telephone Encounter (Signed)
Per pt call went to med center this week concerning her BP being HIGH she would like a call back any time before 4pm if she gets a call back today.  Tomorrow all day.  Pt call trying to do the Appt that was canceled for today.

## 2019-10-24 NOTE — Telephone Encounter (Signed)
Copied from [place] [id number]. Topic: Referral - Request for Referral >> Oct 24, 2019 11:57 AM Richardo Priest, NT wrote: Has patient seen PCP for this complaint? yes *If NO, is insurance requiring patient see PCP for this issue before PCP can refer them? Referral for which specialty: ENT Preferred provider/office: [address] Reason for referral: fluid behind ears, dizziness worsening >> Oct 24, 2019  1:02 PM Peace, Tammy L wrote: Would Mickel Baas be able to enter a referral based off of last OV?

## 2019-10-24 NOTE — Telephone Encounter (Signed)
Unable to leave message mailbox full.Will try later ./cy 

## 2019-10-25 ENCOUNTER — Other Ambulatory Visit: Payer: Self-pay

## 2019-10-25 ENCOUNTER — Emergency Department (HOSPITAL_BASED_OUTPATIENT_CLINIC_OR_DEPARTMENT_OTHER)
Admission: EM | Admit: 2019-10-25 | Discharge: 2019-10-25 | Disposition: A | Discharge status: Home / Self Care | Payer: BC Managed Care – PPO | Attending: Emergency Medicine | Admitting: Emergency Medicine

## 2019-10-25 ENCOUNTER — Encounter (HOSPITAL_BASED_OUTPATIENT_CLINIC_OR_DEPARTMENT_OTHER): Payer: Self-pay | Admitting: *Deleted

## 2019-10-25 ENCOUNTER — Other Ambulatory Visit: Payer: Self-pay | Admitting: Family

## 2019-10-25 DIAGNOSIS — F17210 Nicotine dependence, cigarettes, uncomplicated: Secondary | ICD-10-CM | POA: Diagnosis not present

## 2019-10-25 DIAGNOSIS — R42 Dizziness and giddiness: Secondary | ICD-10-CM

## 2019-10-25 DIAGNOSIS — Z882 Allergy status to sulfonamides status: Secondary | ICD-10-CM | POA: Diagnosis not present

## 2019-10-25 DIAGNOSIS — Z91018 Allergy to other foods: Secondary | ICD-10-CM | POA: Diagnosis not present

## 2019-10-25 DIAGNOSIS — Z886 Allergy status to analgesic agent status: Secondary | ICD-10-CM | POA: Insufficient documentation

## 2019-10-25 DIAGNOSIS — Z79899 Other long term (current) drug therapy: Secondary | ICD-10-CM | POA: Diagnosis not present

## 2019-10-25 DIAGNOSIS — I10 Essential (primary) hypertension: Secondary | ICD-10-CM | POA: Diagnosis not present

## 2019-10-25 HISTORY — DX: Essential (primary) hypertension: I10

## 2019-10-25 MED ORDER — DIAZEPAM 5 MG PO TABS
2.5000 mg | ORAL_TABLET | Freq: Once | ORAL | Status: AC
  Administered 2019-10-25: 01:00:00 2.5 mg via ORAL
  Filled 2019-10-25: qty 1

## 2019-10-25 MED ORDER — AMLODIPINE BESYLATE 5 MG PO TABS
5.0000 mg | ORAL_TABLET | Freq: Once | ORAL | Status: AC
  Administered 2019-10-25: 5 mg via ORAL
  Filled 2019-10-25: qty 1

## 2019-10-25 MED ORDER — DIAZEPAM 5 MG PO TABS: ORAL_TABLET | ORAL | 0 refills | Status: DC

## 2019-10-25 MED ORDER — AMLODIPINE BESYLATE 5 MG PO TABS: 5.0000 mg | ORAL_TABLET | Freq: Every day | ORAL | 0 refills | Status: DC

## 2019-10-25 NOTE — ED Triage Notes (Addendum)
Pt c/o increased BP with dizziness and left arm pain x 3 month " off and on", seen here yesterday for same

## 2019-10-25 NOTE — Telephone Encounter (Signed)
Referral is done; she needs to make sure she is seeing her cardiologist about her blood pressure.

## 2019-10-25 NOTE — Telephone Encounter (Signed)
Patient has been seen in the ER today

## 2019-10-25 NOTE — Telephone Encounter (Signed)
My chart message sent to patient today.

## 2019-10-25 NOTE — ED Provider Notes (Addendum)
[facility] Provider Note: Lowella Dell, MD, FACEP  CSN: [id number] MRN: [id number] ARRIVAL: 10/25/19 at 0032 ROOM: [facility]06/[facility]   CHIEF COMPLAINT  Hypertension   HISTORY OF PRESENT ILLNESS  10/25/19 1:00 AM [name] is a 43 y.o. female who was seen 2 days ago for elevated BP with dizziness and left arm pain for 3 months "off and on".  She had an extensive work-up including chest x-ray, troponin, CBC and BMET which were normal.  She was diagnosed with peripheral vertigo and referred to ENT.  She was also diagnosed with essential hypertension but not started on any antihypertensive.  She has been taking her blood pressure frequently throughout the day and it has been reading higher than normal.  She is not having any symptoms, specifically no headache, blurred vision, numbness, weakness, chest pain or shortness of breath.  She has had some burning on the left side of her neck but that is not new.  She still has intermittent vertigo but states meclizine does not work for her.   Past Medical History:  Diagnosis Date  . Hypertension   . Iron deficiency anemia   . Migraine   . Sarcoidosis   . Vertigo     Past Surgical History:  Procedure Laterality Date  . CESAREAN SECTION    . CESAREAN SECTION    . KNEE SURGERY    . TUBAL LIGATION      Family History  Problem Relation Age of Onset  . Lupus Mother   . Healthy Father   . Heart failure Maternal Grandmother   . Diabetes Maternal Grandmother   . Hypertension Maternal Grandmother   . Thyroid disease Maternal Grandmother   . Diabetes Maternal Grandfather   . Diabetes Paternal Grandmother   . Thyroid disease Sister   . Anemia Sister     Social History   Tobacco Use  . Smoking status: Current Every Day Smoker    Packs/day: 1.00    Years: 17.00    Pack years: 17.00    Types: Cigarettes  . Smokeless tobacco: Never Used  Substance Use Topics  . Alcohol use: Yes    Comment: occ  . Drug use: No     Prior to Admission medications   Medication Sig Start Date End Date Taking? Authorizing Provider  amLODipine (NORVASC) 5 MG tablet Take 1 tablet (5 mg total) by mouth daily. 10/25/19   Gildo Crisco, MD  diazepam (VALIUM) 5 MG tablet Take 1/2 tablet (2.5 mg) 3 times daily as needed for vertigo.  May cause drowsiness. 10/25/19   Bion Todorov, MD  Diclofenac Sodium (PENNSAID) 2 % SOLN Place 1 application onto the skin 2 (two) times daily. Patient taking differently: Place 1 application onto the skin 2 (two) times daily as needed (Dry skin).  04/13/18   Myra Rude, MD  Vitamin D, Ergocalciferol, (DRISDOL) 1.25 MG (50000 UT) CAPS capsule Take 1 capsule (50,000 Units total) by mouth every 7 (seven) days. 10/07/19   Judi Saa, DO    Allergies Fish allergy, Naprosyn [naproxen], and Sulfa antibiotics   REVIEW OF SYSTEMS  Negative except as noted here or in the History of Present Illness.   PHYSICAL EXAMINATION  Initial Vital Signs Blood pressure (!) 161/95, pulse 61, temperature 98.9 F (37.2 C), resp. rate 16, height 5\' 5"  (1.651 m), weight 88 kg, SpO2 99 %.  Examination General: Well-developed, well-nourished female in no acute distress; appearance consistent with age of record HENT: normocephalic; atraumatic Eyes: pupils  equal, round and reactive to light; extraocular muscles intact Neck: supple; nontender Heart: regular rate and rhythm Lungs: clear to auscultation bilaterally Abdomen: soft; nondistended; nontender; bowel sounds present Extremities: No deformity; full range of motion; pulses normal Neurologic: Awake, alert and oriented; motor function intact in all extremities and symmetric; no facial droop Skin: Warm and dry Psychiatric: Normal mood and affect   RESULTS  Summary of this visit's results, reviewed and interpreted by myself:   EKG Interpretation  Date/Time:    Ventricular Rate:    PR Interval:    QRS Duration:   QT Interval:    QTC Calculation:   R  Axis:     Text Interpretation:        Laboratory Studies: No results found for this or any previous visit (from the past 24 hour(s)). Imaging Studies: Dg Chest Portable 1 View  Result Date: 10/23/2019 CLINICAL DATA:  Chest pain. Additional history provided: Dizziness, vertigo, tightness left upper extremity for 2 days. EXAM: PORTABLE CHEST 1 VIEW COMPARISON:  Chest radiograph 11/20/2017 FINDINGS: Heart size within normal limits. There is no airspace consolidation within the lungs. No evidence of pleural effusion or pneumothorax. No acute bony abnormality. Overlying cardiac monitoring leads. IMPRESSION: No evidence of acute cardiopulmonary abnormality. Electronically Signed   By: Kellie Simmering DO   On: 10/23/2019 15:41    ED COURSE and MDM  Nursing notes, initial and subsequent vitals signs, including pulse oximetry, reviewed and interpreted by myself.  Vitals:   10/25/19 0041 10/25/19 0044  BP:  (!) 161/95  Pulse:  61  Resp:  16  Temp:  98.9 F (37.2 C)  SpO2:  99%  Weight: 88 kg   Height: 5\' 5"  (1.651 m)    Medications  amLODipine (NORVASC) tablet 5 mg (has no administration in time range)  diazepam (VALIUM) tablet 2.5 mg (has no administration in time range)    We will start patient on Norvasc 5 mg daily for blood pressure and low-dose Valium for her vertigo.  She was advised to follow-up with her PCP for blood pressure recheck and medication management.  She was advised against repeatedly taking her blood pressure as this can cause anxiety and subsequent increase in blood pressure.  She was advised to take her blood pressure in the morning and evening at the same time and write these numbers down to show her PCP.  PROCEDURES  Procedures   ED DIAGNOSES     ICD-10-CM   1. Hypertension not at goal  I10   2. Vertigo  R42        Carrie Trouten, MD 10/25/19 0112    Shanon Rosser, MD 10/25/19 [id number]

## 2019-10-29 ENCOUNTER — Encounter: Payer: Self-pay | Admitting: Family

## 2019-10-29 ENCOUNTER — Emergency Department (HOSPITAL_COMMUNITY)
Admission: EM | Admit: 2019-10-29 | Discharge: 2019-10-29 | Disposition: A | Discharge status: Home / Self Care | Payer: BC Managed Care – PPO | Attending: Emergency Medicine | Admitting: Emergency Medicine

## 2019-10-29 ENCOUNTER — Ambulatory Visit (INDEPENDENT_AMBULATORY_CARE_PROVIDER_SITE_OTHER): Payer: BC Managed Care – PPO | Admitting: Family

## 2019-10-29 ENCOUNTER — Other Ambulatory Visit: Payer: Self-pay

## 2019-10-29 ENCOUNTER — Encounter (HOSPITAL_COMMUNITY): Payer: Self-pay | Admitting: *Deleted

## 2019-10-29 VITALS — BP 130/82 | HR 72 | Temp 97.7°F | Ht 65.0 in | Wt 197.2 lb

## 2019-10-29 DIAGNOSIS — I10 Essential (primary) hypertension: Secondary | ICD-10-CM

## 2019-10-29 DIAGNOSIS — N939 Abnormal uterine and vaginal bleeding, unspecified: Secondary | ICD-10-CM | POA: Insufficient documentation

## 2019-10-29 DIAGNOSIS — R42 Dizziness and giddiness: Secondary | ICD-10-CM | POA: Diagnosis not present

## 2019-10-29 DIAGNOSIS — Z720 Tobacco use: Secondary | ICD-10-CM | POA: Diagnosis not present

## 2019-10-29 DIAGNOSIS — F17210 Nicotine dependence, cigarettes, uncomplicated: Secondary | ICD-10-CM | POA: Insufficient documentation

## 2019-10-29 DIAGNOSIS — N926 Irregular menstruation, unspecified: Secondary | ICD-10-CM

## 2019-10-29 LAB — COMPREHENSIVE METABOLIC PANEL
ALT: 12 U/L (ref 0–44)
AST: 19 U/L (ref 15–41)
Albumin: 3.3 g/dL — ABNORMAL LOW (ref 3.5–5.0)
Alkaline Phosphatase: 86 U/L (ref 38–126)
Anion gap: 10 (ref 5–15)
BUN: 7 mg/dL (ref 6–20)
CO2: 23 mmol/L (ref 22–32)
Calcium: 9.3 mg/dL (ref 8.9–10.3)
Chloride: 104 mmol/L (ref 98–111)
Creatinine, Ser: 0.7 mg/dL (ref 0.44–1.00)
GFR calc Af Amer: >60 mL/min (ref >60)
GFR calc non Af Amer: >60 mL/min (ref >60)
Glucose, Bld: 90 mg/dL (ref 70–99)
Potassium: 4 mmol/L (ref 3.5–5.1)
Sodium: 137 mmol/L (ref 135–145)
Total Bilirubin: 0.5 mg/dL (ref 0.3–1.2)
Total Protein: 7.8 g/dL (ref 6.5–8.1)

## 2019-10-29 LAB — LIPASE, BLOOD: Lipase: 45 U/L (ref 11–51)

## 2019-10-29 LAB — URINALYSIS, ROUTINE W REFLEX MICROSCOPIC: RBC / HPF: >50 RBC/hpf — ABNORMAL HIGH (ref 0–5)

## 2019-10-29 LAB — I-STAT BETA HCG BLOOD, ED (MC, WL, AP ONLY): I-stat hCG, quantitative: <5 m[IU]/mL (ref <5)

## 2019-10-29 LAB — CBC
HCT: 38.4 % (ref 36.0–46.0)
Hemoglobin: 12.3 g/dL (ref 12.0–15.0)
MCH: 29.5 pg (ref 26.0–34.0)
MCHC: 32 g/dL (ref 30.0–36.0)
MCV: 92.1 fL (ref 80.0–100.0)
Platelets: 269 10*3/uL (ref 150–400)
RBC: 4.17 MIL/uL (ref 3.87–5.11)
RDW: 13.1 % (ref 11.5–15.5)
WBC: 4.6 10*3/uL (ref 4.0–10.5)
nRBC: 0 % (ref 0.0–0.2)

## 2019-10-29 MED ORDER — AMLODIPINE BESYLATE 5 MG PO TABS: 5.0000 mg | ORAL_TABLET | Freq: Every day | ORAL | 1 refills | Status: DC

## 2019-10-29 MED ORDER — SODIUM CHLORIDE 0.9% FLUSH: 3.0000 mL | Freq: Once | INTRAVENOUS | Status: DC

## 2019-10-29 NOTE — Discharge Instructions (Signed)
Stop taking the Valium.  Continue taking amlodipine for blood pressure.  If your symptoms do not resolve in the next 48 to 72 hours, you can try discontinuing the amlodipine to see if this will help your symptoms. Neither of these medications contain aspirin.  You can also talk to your pharmacist to see if either medication has filler agents similar to what is found in aspirin tablets.  However, your vaginal bleeding could be related to being perimenopausal.  Follow-up with your OB/GYN for reevaluation.  Return to the emergency department if any concerning signs or symptoms develop such as facial swelling, difficulty swallowing, wheezing, rash, severe abdominal pain, persistent vomiting, worsening bleeding, or loss of consciousness.

## 2019-10-29 NOTE — Progress Notes (Signed)
[name] is a 43 y.o. female with the following history as recorded in EpicCare:  Patient Active Problem List   Diagnosis Date Noted  . Degenerative arthritis of knee, bilateral 10/07/2019  . Suspected COVID-19 virus infection 05/07/2019  . Chronic pain of left ankle 04/13/2018  . Sinusitis 11/25/2016  . Vitamin D deficiency 07/28/2016  . Routine general medical examination at a health care facility 04/25/2016  . Obesity 04/25/2016  . Tobacco abuse 04/25/2016  . Community acquired pneumonia 09/28/2014  . Anemia 09/28/2014  . Menorrhagia 09/28/2014  . Thrombocytopenia ([place]) 09/28/2014  . CAP (community acquired pneumonia) 09/28/2014    Current Outpatient Medications  Medication Sig Dispense Refill  . amLODipine (NORVASC) 5 MG tablet Take 1 tablet (5 mg total) by mouth daily. 90 tablet 1  . diazepam (VALIUM) 5 MG tablet Take 1/2 tablet (2.5 mg) 3 times daily as needed for vertigo.  May cause drowsiness. 10 tablet 0  . Diclofenac Sodium (PENNSAID) 2 % SOLN Place 1 application onto the skin 2 (two) times daily. (Patient taking differently: Place 1 application onto the skin 2 (two) times daily as needed (Dry skin). ) 1 Bottle 3  . Vitamin D, Ergocalciferol, (DRISDOL) 1.25 MG (50000 UT) CAPS capsule Take 1 capsule (50,000 Units total) by mouth every 7 (seven) days. 12 capsule 0   No current facility-administered medications for this visit.     Allergies: Fish allergy, Asa [aspirin], Naprosyn [naproxen], and Sulfa antibiotics  Past Medical History:  Diagnosis Date  . Hypertension   . Iron deficiency anemia   . Migraine   . Sarcoidosis   . Vertigo     Past Surgical History:  Procedure Laterality Date  . CESAREAN SECTION    . CESAREAN SECTION    . KNEE SURGERY    . TUBAL LIGATION      Family History  Problem Relation Age of Onset  . Lupus Mother   . Healthy Father   . Heart failure Maternal Grandmother   . Diabetes Maternal Grandmother   . Hypertension Maternal  Grandmother   . Thyroid disease Maternal Grandmother   . Diabetes Maternal Grandfather   . Diabetes Paternal Grandmother   . Thyroid disease Sister   . Anemia Sister     Social History   Tobacco Use  . Smoking status: Current Every Day Smoker    Packs/day: 1.00    Years: 17.00    Pack years: 17.00    Types: Cigarettes  . Smokeless tobacco: Never Used  Substance Use Topics  . 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    Objective:  Vitals:   10/29/19 1348  BP: 130/82  Pulse: 72  Temp: 97.7 F (36.5 C)  TempSrc: Oral  SpO2: 99%  Weight: 197 lb 3.2 oz (89.4 kg)  Height: 5\' 5"  (1.651 m)    General: Well developed, well nourished,  in no acute distress  Skin : Warm and dry.  Head: Normocephalic and atraumatic  Lungs: Respirations unlabored; clear to auscultation bilaterally without wheeze, rales, rhonchi  CVS exam: normal rate and regular rhythm.  Neurologic: Alert and oriented; speech intact; face symmetrical; moves all extremities well; CNII-XII intact without focal deficit   Assessment:  1. Essential hypertension   2. Tobacco abuse   3. Excessive bleeding in premenopausal period   4. Dizziness     Plan:  1. Stay on current medication; refill updated; needs to follow up in 6 months unless cardiology takes over management; 2. Stressed need to quit smoking; 3. Encouraged to follow-up with her  GYN; 4. She is to contact cardiology and ENT to complete pending work ups.   No follow-ups on file.  No orders of the defined types were placed in this encounter.   Requested Prescriptions   Signed Prescriptions Disp Refills  . amLODipine (NORVASC) 5 MG tablet 90 tablet 1    Sig: Take 1 tablet (5 mg total) by mouth daily.

## 2019-10-29 NOTE — ED Notes (Signed)
Patient verbalizes understanding of discharge instructions. Opportunity for questioning and answers were provided. Armband removed by staff, pt discharged from ED.  

## 2019-10-29 NOTE — ED Triage Notes (Signed)
Pt reports recently starting new meds, has hx of allergic reaction to asa and now reports vaginal bleeding and abd pain, thinks that new meds contain asa. No acute distress is noted at triage and airway intact.

## 2019-10-29 NOTE — ED Provider Notes (Addendum)
[facility] EMERGENCY DEPARTMENT Provider Note   CSN: [id number] Arrival date & time: 10/29/19  6761     History   Chief Complaint Chief Complaint  Patient presents with  . Allergic Reaction    HPI [name] is a 43 y.o. female with history of hypertension, iron deficiency anemia, migraine headaches, sarcoidosis, vertigo presents for evaluation of acute onset, persistent vaginal bleeding since yesterday evening.  Reports that she has gone through 4 or 5 pads since yesterday.  States that the bleeding feels like her usual menstrual cycles but she has not had a period in about a year and prior to that was perimenopausal.  Notes mild lower abdominal pain cramping consistent with menstrual cycles.  She states that symptoms began after she took a tablet of Valium for her vertigo.  She was recently seen in the ED on 10/25/2019 for her vertigo and elevated blood pressures and was started on Norvasc 5 mg and Valium.  She thinks that the Valium is the culprit of her vaginal bleeding.  Previously she has had vaginal bleeding after taking aspirin so she is concerned that her medications may have aspirin in them.  She denies shortness of breath, wheezing, facial swelling, rash.  Has been able to tolerate p.o. without difficulty.     HPI  Past Medical History:  Diagnosis Date  . Hypertension   . Iron deficiency anemia   . Migraine   . Sarcoidosis   . Vertigo     Patient Active Problem List   Diagnosis Date Noted  . Degenerative arthritis of knee, bilateral 10/07/2019  . Suspected COVID-19 virus infection 05/07/2019  . Chronic pain of left ankle 04/13/2018  . Sinusitis 11/25/2016  . Vitamin D deficiency 07/28/2016  . Routine general medical examination at a health care facility 04/25/2016  . Obesity 04/25/2016  . Tobacco abuse 04/25/2016  . Community acquired pneumonia 09/28/2014  . Anemia 09/28/2014  . Menorrhagia 09/28/2014  . Thrombocytopenia ([place]) 09/28/2014   . CAP (community acquired pneumonia) 09/28/2014    Past Surgical History:  Procedure Laterality Date  . CESAREAN SECTION    . CESAREAN SECTION    . KNEE SURGERY    . TUBAL LIGATION       OB History   No obstetric history on file.      Home Medications    Prior to Admission medications   Medication Sig Start Date End Date Taking? Authorizing Provider  amLODipine (NORVASC) 5 MG tablet Take 1 tablet (5 mg total) by mouth daily. 10/25/19   Molpus, John, MD  diazepam (VALIUM) 5 MG tablet Take 1/2 tablet (2.5 mg) 3 times daily as needed for vertigo.  May cause drowsiness. 10/25/19   Molpus, John, MD  Diclofenac Sodium (PENNSAID) 2 % SOLN Place 1 application onto the skin 2 (two) times daily. Patient taking differently: Place 1 application onto the skin 2 (two) times daily as needed (Dry skin).  04/13/18   Rosemarie Ax, MD  Vitamin D, Ergocalciferol, (DRISDOL) 1.25 MG (50000 UT) CAPS capsule Take 1 capsule (50,000 Units total) by mouth every 7 (seven) days. 10/07/19   Lyndal Pulley, DO    Family History Family History  Problem Relation Age of Onset  . Lupus Mother   . Healthy Father   . Heart failure Maternal Grandmother   . Diabetes Maternal Grandmother   . Hypertension Maternal Grandmother   . Thyroid disease Maternal Grandmother   . Diabetes Maternal Grandfather   . Diabetes Paternal  Grandmother   . Thyroid disease Sister   . Anemia Sister     Social History Social History   Tobacco Use  . Smoking status: Current Every Day Smoker    Packs/day: 1.00    Years: 17.00    Pack years: 17.00    Types: Cigarettes  . Smokeless tobacco: Never Used  Substance Use Topics  . Alcohol use: Yes    Comment: occ  . Drug use: No     Allergies   Fish allergy, Asa [aspirin], Naprosyn [naproxen], and Sulfa antibiotics   Review of Systems Review of Systems  HENT: Positive for trouble swallowing. Negative for facial swelling.   Respiratory: Negative for shortness of  breath and wheezing.   Genitourinary: Positive for vaginal bleeding.  Skin: Negative for rash.  All other systems reviewed and are negative.    Physical Exam Updated Vital Signs BP 131/85 (BP Location: Left Arm)   Pulse 72   Temp 98.3 F (36.8 C) (Oral)   Resp 18   SpO2 100%   Physical Exam Vitals and nursing note reviewed.  Constitutional:      General: She is not in acute distress.    Appearance: She is well-developed.  HENT:     Head: Normocephalic and atraumatic.     Mouth/Throat:     Comments: Tolerating secretions without difficulty.  No swelling of the lips or tongue.  No periorbital swelling.  No uvular edema.  No abnormal phonation. Eyes:     General:        Right eye: No discharge.        Left eye: No discharge.     Conjunctiva/sclera: Conjunctivae normal.  Neck:     Vascular: No JVD.     Trachea: No tracheal deviation.     Comments: No upper airway stridor Cardiovascular:     Rate and Rhythm: Normal rate and regular rhythm.  Pulmonary:     Effort: Pulmonary effort is normal.     Breath sounds: Normal breath sounds.     Comments: Speaking in full sentences without difficulty, SPO2 saturations 100% on room air Abdominal:     General: Bowel sounds are normal. There is no distension.     Palpations: Abdomen is soft.     Tenderness: There is no abdominal tenderness. There is no guarding or rebound.  Musculoskeletal:     Cervical back: Neck supple.  Skin:    General: Skin is warm and dry.     Findings: No erythema.  Neurological:     Mental Status: She is alert.  Psychiatric:        Behavior: Behavior normal.      ED Treatments / Results  Labs (all labs ordered are listed, but only abnormal results are displayed) Labs Reviewed  COMPREHENSIVE METABOLIC PANEL - Abnormal; Notable for the following components:      Result Value   Albumin 3.3 (*)    All other components within normal limits  URINALYSIS, ROUTINE W REFLEX MICROSCOPIC - Abnormal; Notable  for the following components:   Color, Urine RED (*)    APPearance   (*)    Value: TEST NOT REPORTED DUE TO COLOR INTERFERENCE OF URINE PIGMENT   Glucose, UA   (*)    Value: TEST NOT REPORTED DUE TO COLOR INTERFERENCE OF URINE PIGMENT   Hgb urine dipstick   (*)    Value: TEST NOT REPORTED DUE TO COLOR INTERFERENCE OF URINE PIGMENT   Bilirubin Urine   (*)  Value: TEST NOT REPORTED DUE TO COLOR INTERFERENCE OF URINE PIGMENT   Ketones, ur   (*)    Value: TEST NOT REPORTED DUE TO COLOR INTERFERENCE OF URINE PIGMENT   Protein, ur   (*)    Value: TEST NOT REPORTED DUE TO COLOR INTERFERENCE OF URINE PIGMENT   Nitrite   (*)    Value: TEST NOT REPORTED DUE TO COLOR INTERFERENCE OF URINE PIGMENT   Leukocytes,Ua   (*)    Value: TEST NOT REPORTED DUE TO COLOR INTERFERENCE OF URINE PIGMENT   RBC / HPF >50 (*)    Bacteria, UA RARE (*)    All other components within normal limits  LIPASE, BLOOD  CBC  I-STAT BETA HCG BLOOD, ED ([facility], [facility], [facility] ONLY)    EKG None  Radiology No results found.  Procedures Procedures (including critical care time)  Medications Ordered in ED Medications  sodium chloride flush (NS) 0.9 % injection 3 mL (has no administration in time range)     Initial Impression / Assessment and Plan / ED Course  I have reviewed the triage vital signs and the nursing notes.  Pertinent labs & imaging results that were available during my care of the patient were reviewed by me and considered in my medical decision making (see chart for details).        Patient presents for evaluation of vaginal bleeding that began after taking Valium.  She is concerned that the Valium contained aspirin which previously has caused vaginal bleeding when she has taken it.  The patient is afebrile, vital signs are stable.  Her blood pressure is within normal limits.  She is nontoxic in appearance.  No signs of angioedema or respiratory distress.  No evidence of anaphylactic reaction.  Abdomen is  soft and nontender.  Doubt acute surgical abdominal pathology including obstruction, perforation, ovarian torsion, PID, or TOA.  Lab work reviewed by me shows no leukocytosis, no anemia, no renal insufficiency, no metabolic derangements.  Suspect she is either having a menstrual period and has not yet fully menopausal or could potentially be having a reaction to a filler agent in her new medications that could have been present and aspirin.  In any case, she is in no apparent distress and is hemodynamically stable, tolerating secretions without difficulty.  I instructed her to discontinue her medications and follow-up with her PCP or cardiology for medication management for her hypertension.  Also recommended follow-up with OB/GYN for reevaluation of abnormal vaginal bleeding.  Discussed strict ED return precautions. Patient verbalized understanding of and agreement with plan and is safe for discharge home at this time.   Final Clinical Impressions(s) / ED Diagnoses   Final diagnoses:  Abnormal vaginal bleeding    ED Discharge Orders    None       Bennye AlmFawze, Micaella Gitto A, PA-C 10/29/19 1228    Milagros Lollykstra, Richard S, MD 10/30/19 1411    5:39 PM Note addended to document ROS and physical examination    Bennye AlmFawze, Chad Tiznado A, PA-C 12/16/19 1743    Milagros Lollykstra, Richard S, MD 12/17/19 2205

## 2019-10-29 NOTE — Patient Instructions (Addendum)
[facility] ENT [phone]  Please call your GYN to schedule a follow-up and cardiology to get that work up completed;

## 2019-10-31 ENCOUNTER — Telehealth: Payer: Self-pay | Admitting: Family

## 2019-10-31 NOTE — Telephone Encounter (Signed)
It was actually put in on the 4th but I will be sure to send it over to them

## 2019-10-31 NOTE — Telephone Encounter (Signed)
Referral Request - Has patient seen PCP for this complaint? Yes.   *If NO, is insurance requiring patient see PCP for this issue before PCP can refer them? Referral for which specialty: ENT Preferred provider/office: [facility] Reason for referral: patient has an appt on 11/05/19 and she needs her referral to be send to them before then. If any questions please call patient, thanks.

## 2019-10-31 NOTE — Telephone Encounter (Signed)
I did this referral 2 weeks ago- does it need to be done again?

## 2019-10-31 NOTE — Telephone Encounter (Signed)
Saw Murray on 12/8.

## 2019-11-05 DIAGNOSIS — R42 Dizziness and giddiness: Secondary | ICD-10-CM | POA: Diagnosis not present

## 2019-11-05 DIAGNOSIS — H6983 Other specified disorders of Eustachian tube, bilateral: Secondary | ICD-10-CM | POA: Diagnosis not present

## 2019-11-05 NOTE — Progress Notes (Signed)
Virtual Visit via Video Note   This visit type was conducted due to national recommendations for restrictions regarding the COVID-19 Pandemic (e.g. social distancing) in an effort to limit this patient's exposure and mitigate transmission in our community.  Due to her co-morbid illnesses, this patient is at least at moderate risk for complications without adequate follow up.  This format is felt to be most appropriate for this patient at this time.  All issues noted in this document were discussed and addressed.  A limited physical exam was performed with this format.  Please refer to the patient's chart for her consent to telehealth for [facility].   Date:  11/05/2019   ID:  [name], DOB July 23, 1976, MRN [id number]  Patient Location: Home Provider Location: Home  PCP:  Evaristo Bury, NP  Cardiologist:  Reatha Harps, MD  Electrophysiologist:  None   Evaluation Performed:  Follow-Up Visit  Chief Complaint:  Dizziness  History of Present Illness:    [name] is a 43 y.o. female we are following for ongoing assessment and management of bradycardia and dizziness.  She has a history of sinusitis, vitamin D deficiency, lupus, ongoing tobacco abuse, thrombocytopenia, iron deficiency anemia and prior history of community-acquired pneumonia.  She works as a Naval architect.  She complained of frequent palpitations and dizziness which occur on a daily basis.  She also reported that she feels as if she was having a "panic attack" while driving and she had to pull over because she felt so bad.  She had associated dyspnea.  She was started on meclizine but had no improvement in her symptoms.  When last seen in the office on 09/03/2019 I ordered a echocardiogram for evidence of structural heart disease or PFO.  I also ordered a 1 week ZIO cardiac monitor to evaluate for frequency and morphology of her palpitations.  A TSH was checked along with a hemoglobin A1c due to  family history of thyroid disease and diabetes.  Echocardiogram dated 09/11/2019 revealed normal LVEF of 55 to 60%, there was no LVH, no diastolic dysfunction.  There is no evidence of PFO or valvular abnormalities.   Cardiac monitor reviewed and was did not find any abnormalities. HR range from 46-180 with average HR of 76. No SVT or arrhythmias.   She was seen in the emergency room on 10/29/2019 in the setting of abdominal pain and cramping consistent with menstrual cycles.  She took a Valium for her vertigo and was found to have elevated blood pressures, as well as worsening vaginal bleeding.  Her blood pressure was 131/85 with a pulse of 72.  She had no evidence of anaphylactic reaction.  ER physician postulated that she may be having reaction to a filler agent and her new medications that have caused her symptoms along with aspirin.  She was advised to discontinue Valium and follow-up with her OB/GYN for reevaluation of abnormal vaginal bleeding.  She continues to have dizziness at times. She has been seen by ENT and is due for a MRI of the brain in January 2021. She is thinking of applying for disability.   The patient does not have symptoms concerning for COVID-19 infection (fever, chills, cough, or new shortness of breath).    Past Medical History:  Diagnosis Date  . Hypertension   . Iron deficiency anemia   . Migraine   . Sarcoidosis   . Vertigo    Past Surgical History:  Procedure Laterality Date  .  CESAREAN SECTION    . CESAREAN SECTION    . KNEE SURGERY    . TUBAL LIGATION       No outpatient medications have been marked as taking for the 11/06/19 encounter (Appointment) with Lendon Colonel, NP.     Allergies:   Fish allergy, Asa [aspirin], Naprosyn [naproxen], and Sulfa antibiotics   Social History   Tobacco Use  . Smoking status: Current Every Day Smoker    Packs/day: 1.00    Years: 17.00    Pack years: 17.00    Types: Cigarettes  . Smokeless tobacco: Never  Used  Substance Use Topics  . Alcohol use: Yes    Comment: occ  . Drug use: No     Family Hx: The patient's family history includes Anemia in her sister; Diabetes in her maternal grandfather, maternal grandmother, and paternal grandmother; Healthy in her father; Heart failure in her maternal grandmother; Hypertension in her maternal grandmother; Lupus in her mother; Thyroid disease in her maternal grandmother and sister.  ROS:   Please see the history of present illness.    All other systems reviewed and are negative.   Prior CV studies:   The following studies were reviewed today: Cardiac Monitor 09/17/2019 Patient had a min HR of 46 bpm, max HR of 180 bpm, and avg HR of 75 bpm. Predominant underlying rhythm was Sinus Rhythm. Isolated SVEs were rare (<1.0%), and no SVE Couplets or SVE Triplets were present. Isolated VEs were rare (<1.0%), and no VE Couplets or VE Triplets were present.  Labs/Other Tests and Data Reviewed:    EKG:  No ECG reviewed.  Recent Labs: 09/05/2019: TSH 0.448 10/29/2019: ALT 12; BUN 7; Creatinine, Ser 0.70; Hemoglobin 12.3; Platelets 269; Potassium 4.0; Sodium 137   Recent Lipid Panel Lab Results  Component Value Date/Time   CHOL 143 07/28/2016 11:19 AM   TRIG 45.0 07/28/2016 11:19 AM   HDL 63.60 07/28/2016 11:19 AM   CHOLHDL 2 07/28/2016 11:19 AM   LDLCALC 71 07/28/2016 11:19 AM    Wt Readings from Last 3 Encounters:  10/29/19 197 lb 3.2 oz (89.4 kg)  10/25/19 194 lb 0.1 oz (88 kg)  10/23/19 196 lb (88.9 kg)     Objective:    Vital Signs:  There were no vitals taken for this visit.   VITAL SIGNS:  reviewed GEN:  no acute distress EYES:  sclerae anicteric, EOMI - Extraocular Movements Intact RESPIRATORY:  normal respiratory effort, symmetric expansion MUSCULOSKELETAL:  no obvious deformities. NEURO:  alert and oriented x 3, no obvious focal deficit PSYCH:  normal affect  ASSESSMENT & PLAN:    1.  Chronic dizziness: Patient is  debilitated by this.  She has stopped working and driving as a result.  Cardiac monitor was negative for life-threatening cardiac arrhythmias, SVT, but did have evidence of rapid heart rate of 180 noted at midnight 1 evening, with heart rates over 100 in the late evening.  However average heart rate was 76 bpm.  The patient felt some dizziness while I was speaking with her over the video telemedicine visit today.  Will await MRI results from ENT.  May need to consider changing her amlodipine to metoprolol to assist with rapid heart rate should this occur.  She will need to have her blood pressure checked every day.  I am reluctant to give her high doses of beta-blocker with a normal resting heart rate in the 70s, and normal variations of heart rate throughout the day with rare  exceptions.  2.  Hypertension: She states that she has highs and lows with her blood pressure sometimes as high as 200/128 and at other times more normal.  When seen in the ER recently her blood pressure was normal.  We will continue her on amlodipine 5 mg currently.  She is advised to take her blood pressure daily and record.  3.  Self-admitted anxiety: The patient states that she feels at times she is having panic attacks.  We will have her follow-up with her primary care physician to discuss further and possible antianxiety medication should this occur.  However this will be deferred to their recommendations on PCP assessment.  COVID-19 Education: The signs and symptoms of COVID-19 were discussed with the patient and how to seek care for testing (follow up with PCP or arrange E-visit). The importance of social distancing was discussed today.  Time:   Today, I have spent 15 minutes with the patient with telehealth technology discussing the above problems.     Medication Adjustments/Labs and Tests Ordered: Current medicines are reviewed at length with the patient today.  Concerns regarding medicines are outlined above.   Tests  Ordered: No orders of the defined types were placed in this encounter.   Medication Changes: No orders of the defined types were placed in this encounter.   Disposition:  Follow up 1 month  Signed, Bettey MareKathryn M. Liborio NixonLawrence DNP, ANP, AACC  11/05/2019 10:58 AM    [facility]

## 2019-11-06 ENCOUNTER — Encounter: Payer: Self-pay | Admitting: Adult Health

## 2019-11-06 ENCOUNTER — Telehealth (INDEPENDENT_AMBULATORY_CARE_PROVIDER_SITE_OTHER): Payer: BC Managed Care – PPO | Admitting: Adult Health

## 2019-11-06 ENCOUNTER — Other Ambulatory Visit: Payer: Self-pay | Admitting: Otolaryngology

## 2019-11-06 VITALS — Ht 65.0 in | Wt 196.0 lb

## 2019-11-06 DIAGNOSIS — I10 Essential (primary) hypertension: Secondary | ICD-10-CM | POA: Diagnosis not present

## 2019-11-06 DIAGNOSIS — R42 Dizziness and giddiness: Secondary | ICD-10-CM

## 2019-11-06 NOTE — Patient Instructions (Addendum)
Medication Instructions:  Continue current medications  If you need a refill on your cardiac medications before your next appointment, please call your pharmacy.  Labwork: None Ordered   Testing/Procedures: None Ordered  Reduce your risk of getting COVID-19 With your heart disease it is especially important for people at increased risk of severe illness from COVID-19, and those who live with them, to protect themselves from getting COVID-19. The best way to protect yourself and to help reduce the spread of the virus that causes COVID-19 is to: Marland Kitchen Limit your interactions with other people as much as possible. . Take precautions to prevent getting COVID-19 when you do interact with others. If you start feeling sick and think you may have COVID-19, get in touch with your healthcare provider within 24 hours.  Follow-Up: Monday January 18th @ 10:45 am  At [facility], you and your health needs are our priority.  As part of our continuing mission to provide you with exceptional heart care, we have created designated Provider Care Teams.  These Care Teams include your primary Cardiologist (physician) and Advanced Practice Providers (APPs -  Physician Assistants and Nurse Practitioners) who all work together to provide you with the care you need, when you need it.  Thank you for choosing [facility] at [facility]!!     Happy Holidays!!

## 2019-11-08 ENCOUNTER — Telehealth: Payer: Self-pay | Admitting: Cardiovascular Disease

## 2019-11-08 NOTE — Telephone Encounter (Signed)
Mrs. Strokes notified of monitor results. No treatment needed.   [place] T. Audie Box, [place]  [address] [place], [place] [address] [phone]  9:38 AM

## 2019-12-03 ENCOUNTER — Other Ambulatory Visit: Payer: BC Managed Care – PPO

## 2019-12-04 ENCOUNTER — Other Ambulatory Visit: Payer: Self-pay

## 2019-12-04 ENCOUNTER — Ambulatory Visit
Admission: RE | Admit: 2019-12-04 | Discharge: 2019-12-04 | Disposition: A | Discharge status: Home / Self Care | Payer: BC Managed Care – PPO | Source: Ambulatory Visit | Attending: Otolaryngology | Admitting: Otolaryngology

## 2019-12-04 DIAGNOSIS — R42 Dizziness and giddiness: Secondary | ICD-10-CM | POA: Diagnosis not present

## 2019-12-04 MED ORDER — GADOBENATE DIMEGLUMINE 529 MG/ML IV SOLN
18.0000 mL | Freq: Once | INTRAVENOUS | Status: AC | PRN
  Administered 2019-12-04: 18 mL via INTRAVENOUS

## 2019-12-06 ENCOUNTER — Other Ambulatory Visit: Payer: BC Managed Care – PPO

## 2019-12-07 NOTE — Progress Notes (Signed)
Cardiology Office Note   Date:  12/09/2019   ID:  NYLA CREASON, DOB 01-24-1976, MRN [id number]  PCP:  Evaristo Bury, NP  Cardiologist: Dr. Flora Lipps No chief complaint on file.    History of Present Illness: [name] is a 44 y.o. female who presents for ongoing assessment and management of bradycardia and dizziness.  She has a history of sinusitis, vitamin D deficiency, lupus, ongoing tobacco abuse, thrombocytopenia, iron deficiency anemia and prior history of community-acquired pneumonia.  She works as a Naval [employer].  She complained of frequent palpitations and dizziness which occur on a daily basis.  She also reported that she feels as if she was having a "panic attack" while driving and she had to pull over because she felt so bad.  She had associated dyspnea.  She was started on meclizine but had no improvement in her symptoms.   Echocardiogram dated 09/11/2019 revealed normal LVEF of 55 to 60%, there was no LVH, no diastolic dysfunction.  There is no evidence of PFO or valvular abnormalities.  Cardiac monitor reviewed and was did not find any abnormalities. HR range from 46-180 with average HR of 76. No SVT or arrhythmias.   On last office visit dated 11/06/2019 she continued to have some dizziness and was also found to be hypertensive.  She was planned to have an MRI by her ENT, which was completed on 12/04/2019, (study has not yet been read) ,for ongoing dizziness.  She was continued on amlodipine 5 mg.  Cardiac monitor did reveal rapid heart rhythm which was rare but noted to have rates up to 180 bpm.  I was concerned about adding beta-blocker with essentially normal blood pressures prior to office visit.  She was also to talk with her PCP about antianxiety medications.  [name] comes today without any further complaints of racing heart rate.  She continues to experience vertigo especially when riding in a car in the backseat.  She has not yet followed up with ENT for  results of MRI.  I have reviewed with her cardiac monitor results.  Past Medical History:  Diagnosis Date  . Hypertension   . Iron deficiency anemia   . Migraine   . Sarcoidosis   . Vertigo     Past Surgical History:  Procedure Laterality Date  . CESAREAN SECTION    . CESAREAN SECTION    . KNEE SURGERY    . TUBAL LIGATION       Current Outpatient Medications  Medication Sig Dispense Refill  . amLODipine (NORVASC) 5 MG tablet Take 1 tablet (5 mg total) by mouth daily. 90 tablet 1  . diazepam (VALIUM) 5 MG tablet Take 1/2 tablet (2.5 mg) 3 times daily as needed for vertigo.  May cause drowsiness. 10 tablet 0  . Vitamin D, Ergocalciferol, (DRISDOL) 1.25 MG (50000 UT) CAPS capsule Take 1 capsule (50,000 Units total) by mouth every 7 (seven) days. 12 capsule 0   No current facility-administered medications for this visit.    Allergies:   Fish allergy, Asa [aspirin], Naprosyn [naproxen], and Sulfa antibiotics    Social History:  The patient  reports that she has been smoking cigarettes. She has a 17.00 pack-year smoking history. She has never used smokeless tobacco. She reports current alcohol use. She reports that she does not use drugs.   Family History:  The patient's family history includes Anemia in her sister; Diabetes in her maternal grandfather, maternal grandmother, and paternal grandmother; Healthy in her father; Heart  failure in her maternal grandmother; Hypertension in her maternal grandmother; Lupus in her mother; Thyroid disease in her maternal grandmother and sister.    ROS: All other systems are reviewed and negative. Unless otherwise mentioned in H&P    PHYSICAL EXAM: VS:  BP 116/80   Pulse 70   Ht 5\' 5"  (1.651 m)   Wt 195 lb 12.8 oz (88.8 kg)   SpO2 97%   BMI 32.58 kg/m  , BMI Body mass index is 32.58 kg/m. GEN: Well nourished, well developed, in no acute distress HEENT: normal Neck: no JVD, carotid bruits, or masses Cardiac: RRR; no murmurs, rubs, or  gallops,no edema  Respiratory:  Clear to auscultation bilaterally, normal work of breathing GI: soft, nontender, nondistended, + BS MS: no deformity or atrophy Skin: warm and dry, no rash Neuro:  Strength and sensation are intact Psych: euthymic mood, full affect   EKG: Not completed this office visit Recent Labs: 09/05/2019: TSH 0.448 10/29/2019: ALT 12; BUN 7; Creatinine, Ser 0.70; Hemoglobin 12.3; Platelets 269; Potassium 4.0; Sodium 137    Lipid Panel    Component Value Date/Time   CHOL 143 07/28/2016 1119   TRIG 45.0 07/28/2016 1119   HDL 63.60 07/28/2016 1119   CHOLHDL 2 07/28/2016 1119   VLDL 9.0 07/28/2016 1119   LDLCALC 71 07/28/2016 1119      Wt Readings from Last 3 Encounters:  12/09/19 195 lb 12.8 oz (88.8 kg)  11/06/19 196 lb (88.9 kg)  10/29/19 197 lb 3.2 oz (89.4 kg)      Other studies Reviewed: Cardiac Monitor 09/17/2019 Patient had a min HR of 46 bpm, max HR of 180 bpm, and avg HR of 75 bpm. Predominant underlying rhythm was Sinus Rhythm. Isolated SVEs were rare (<1.0%), and no SVE Couplets or SVE Triplets were present. Isolated VEs were rare (<1.0%), and no VE Couplets or VE Triplets were present.  Echocardiogram 09/11/2019  1. Left ventricular ejection fraction, by visual estimation, is 55 to 60%. The left ventricle has normal function. Normal left ventricular size. There is no left ventricular hypertrophy. Normal wall motion. Normal diastolic function.  2. Global right ventricle has normal systolic function.The right ventricular size is normal. No increase in right ventricular wall thickness.  3. Left atrial size was normal.  4. Right atrial size was normal.  5. The mitral valve is normal in structure. Trace mitral valve regurgitation. No evidence of mitral stenosis.  6. The tricuspid valve is normal in structure. Tricuspid valve regurgitation is trivial.  7. The aortic valve is normal in structure. Aortic valve regurgitation was not visualized by  color flow Doppler. Structurally normal aortic valve, with no evidence of sclerosis or stenosis.  8. The tricuspid regurgitant velocity is 2.16 m/s, and with an assumed right atrial pressure of 3 mmHg, the estimated right ventricular systolic pressure is normal at 21.7 mmHg.  9. The inferior vena cava is normal in size with greater than 50% respiratory variability, suggesting right atrial pressure of 3 mmHg.  ASSESSMENT AND PLAN:  1.  Dizziness: After extensive cardiac work-up to include echocardiogram, cardiac monitor, lab reviews, there is no cardiac etiology found which may be contributing to her symptoms.  I have advised her to follow-up with ENT and PCP for ongoing testing and management of her symptoms.  I have reviewed her cardiac monitor and given her a copy for her own records.  2.  Hypertension: Blood pressures currently very well controlled.  She is making a concerted effort to lose  weight and to exercise more.  She has lost approximately 5 pounds by limiting salt in her diet.  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  I have advised her to try and wean herself off or switch to diet drinks by substituting them throughout the day.  She verbalizes understanding.  I did counsel her on weight loss contributing to hypotension on amlodipine.  If she loses anywhere from 15 to 20 pounds she will need to call us so that we can adjust her antihypertensive medications to prevent syncope and hypotension.   Current medicines are reviewed at length with the patient today.    Labs/ tests ordered today include: None  [name], ANP, AACC   12/09/2019 12:14 PM    [facility] [address] Office [phone] Fax [phone]  Notice: This dictation was prepared with Dragon dictation along with smaller phrase technology. Any transcriptional errors that result from this process are unintentional and may not be  corrected upon review.

## 2019-12-09 ENCOUNTER — Encounter: Payer: Self-pay | Admitting: Adult Health

## 2019-12-09 ENCOUNTER — Ambulatory Visit (INDEPENDENT_AMBULATORY_CARE_PROVIDER_SITE_OTHER): Payer: BC Managed Care – PPO | Admitting: Adult Health

## 2019-12-09 ENCOUNTER — Other Ambulatory Visit: Payer: Self-pay

## 2019-12-09 VITALS — BP 116/80 | HR 70 | Ht 65.0 in | Wt 195.8 lb

## 2019-12-09 DIAGNOSIS — R42 Dizziness and giddiness: Secondary | ICD-10-CM

## 2019-12-09 DIAGNOSIS — R000 Tachycardia, unspecified: Secondary | ICD-10-CM | POA: Diagnosis not present

## 2019-12-09 DIAGNOSIS — I10 Essential (primary) hypertension: Secondary | ICD-10-CM

## 2019-12-09 NOTE — Patient Instructions (Signed)
Medication Instructions:  Your physician recommends that you continue on your current medications as directed. Please refer to the Current Medication list given to you today.  *If you need a refill on your cardiac medications before your next appointment, please call your pharmacy*  Lab Work: none If you have labs (blood work) drawn today and your tests are completely normal, you will receive your results only by: Marland Kitchen MyChart Message (if you have MyChart) OR . A paper copy in the mail If you have any lab test that is abnormal or we need to change your treatment, we will call you to review the results.  Testing/Procedures: none  Follow-Up: At [facility], you and your health needs are our priority.  As part of our continuing mission to provide you with exceptional heart care, we have created designated Provider Care Teams.  These Care Teams include your primary Cardiologist (physician) and Advanced Practice Providers (APPs -  Physician Assistants and Nurse Practitioners) who all work together to provide you with the care you need, when you need it.  Your next appointment:   6 month(s)  The format for your next appointment:   In Person  Provider:   Joni Reining, DNP  Other Instructions PLEASE CONTACT OUR OFFICE FOR WEIGHT LOSS OF 10-15 LBS SO THAT YOUR MEDICATION(S) CAN BE ADJUSTED

## 2019-12-31 ENCOUNTER — Other Ambulatory Visit: Payer: Self-pay | Admitting: Family Medicine

## 2020-02-07 DIAGNOSIS — R42 Dizziness and giddiness: Secondary | ICD-10-CM | POA: Diagnosis not present

## 2020-02-20 ENCOUNTER — Ambulatory Visit: Payer: BC Managed Care – PPO | Attending: Internal Medicine

## 2020-02-20 DIAGNOSIS — Z23 Encounter for immunization: Secondary | ICD-10-CM

## 2020-02-20 NOTE — Progress Notes (Signed)
   Covid-19 Vaccination Clinic  Name:  [name]    MRN: [id number] DOB: 01/13/1976  02/20/2020  [name] was observed post Covid-19 immunization for 15 minutes without incident. She was provided with Vaccine Information Sheet and instruction to access the V-Safe system.   [name] was instructed to call 911 with any severe reactions post vaccine: Marland Kitchen Difficulty breathing  . Swelling of face and throat  . A fast heartbeat  . A bad rash all over body  . Dizziness and weakness   Immunizations Administered    Name Date Dose VIS Date Route   Pfizer COVID-19 Vaccine 02/20/2020  3:03 PM 0.3 mL 11/01/2019 Intramuscular   Manufacturer: [employer], [employer]   Lot: [id number]   NDC: [address]-7

## 2020-03-16 ENCOUNTER — Ambulatory Visit: Payer: BC Managed Care – PPO | Attending: Internal Medicine

## 2020-03-16 DIAGNOSIS — Z23 Encounter for immunization: Secondary | ICD-10-CM

## 2020-03-16 NOTE — Progress Notes (Signed)
   Covid-19 Vaccination Clinic  Name:  TEAIRRA MILLAR    MRN: [id number] DOB: May 24, 1976  03/16/2020  Ms. Antigua was observed post Covid-19 immunization for 30 minutes based on pre-vaccination screening without incident. She was provided with Vaccine Information Sheet and instruction to access the V-Safe system.   [name] was instructed to call 911 with any severe reactions post vaccine: Marland Kitchen Difficulty breathing  . Swelling of face and throat  . A fast heartbeat  . A bad rash all over body  . Dizziness and weakness   Immunizations Administered    Name Date Dose VIS Date Route   Pfizer COVID-19 Vaccine 03/16/2020  3:28 PM 0.3 mL 01/15/2019 Intramuscular   Manufacturer: [employer], [employer]   Lot: [id number]   NDC: [address]-6

## 2020-05-09 ENCOUNTER — Other Ambulatory Visit: Payer: Self-pay | Admitting: Family Medicine

## 2020-05-16 ENCOUNTER — Other Ambulatory Visit: Payer: Self-pay | Admitting: Internal Medicine

## 2020-05-22 ENCOUNTER — Telehealth (INDEPENDENT_AMBULATORY_CARE_PROVIDER_SITE_OTHER): Payer: BC Managed Care – PPO | Admitting: Family

## 2020-05-22 DIAGNOSIS — J019 Acute sinusitis, unspecified: Secondary | ICD-10-CM | POA: Diagnosis not present

## 2020-05-22 MED ORDER — AMOXICILLIN-POT CLAVULANATE 875-125 MG PO TABS: 1.0000 | ORAL_TABLET | Freq: Two times a day (BID) | ORAL | 0 refills | Status: AC

## 2020-05-22 NOTE — Progress Notes (Signed)
[name] is a 44 y.o. female with the following history as recorded in EpicCare:  Patient Active Problem List   Diagnosis Date Noted  . Degenerative arthritis of knee, bilateral 10/07/2019  . Suspected COVID-19 virus infection 05/07/2019  . Chronic pain of left ankle 04/13/2018  . Sinusitis 11/25/2016  . Vitamin D deficiency 07/28/2016  . Routine general medical examination at a health care facility 04/25/2016  . Obesity 04/25/2016  . Tobacco abuse 04/25/2016  . Community acquired pneumonia 09/28/2014  . Anemia 09/28/2014  . Menorrhagia 09/28/2014  . Thrombocytopenia (HCC) 09/28/2014  . CAP (community acquired pneumonia) 09/28/2014    Current Outpatient Medications  Medication Sig Dispense Refill  . meclizine (ANTIVERT) 25 MG tablet Take 25 mg by mouth 3 (three) times daily as needed for dizziness.    Marland Kitchen amLODipine (NORVASC) 5 MG tablet TAKE 1 TABLET BY MOUTH EVERY DAY 90 tablet 0  . amoxicillin-clavulanate (AUGMENTIN) 875-125 MG tablet Take 1 tablet by mouth 2 (two) times daily for 10 days. 20 tablet 0  . diazepam (VALIUM) 5 MG tablet Take 1/2 tablet (2.5 mg) 3 times daily as needed for vertigo.  May cause drowsiness. (Patient not taking: Reported on 05/22/2020) 10 tablet 0  . Vitamin D, Ergocalciferol, (DRISDOL) 1.25 MG (50000 UNIT) CAPS capsule TAKE 1 CAPSULE (50,000 UNITS TOTAL) BY MOUTH EVERY 7 (SEVEN) DAYS. (Patient not taking: Reported on 05/22/2020) 12 capsule 0   No current facility-administered medications for this visit.    Allergies: Fish allergy, Asa [aspirin], Naprosyn [naproxen], and Sulfa antibiotics  Past Medical History:  Diagnosis Date  . Hypertension   . Iron deficiency anemia   . Migraine   . Sarcoidosis   . Vertigo     Past Surgical History:  Procedure Laterality Date  . CESAREAN SECTION    . CESAREAN SECTION    . KNEE SURGERY    . TUBAL LIGATION      Family History  Problem Relation Age of Onset  . Lupus Mother   . Healthy Father   . Heart  failure Maternal Grandmother   . Diabetes Maternal Grandmother   . Hypertension Maternal Grandmother   . Thyroid disease Maternal Grandmother   . Diabetes Maternal Grandfather   . Diabetes Paternal Grandmother   . Thyroid disease Sister   . Anemia Sister     Social History   Tobacco Use  . Smoking status: Current Every Day Smoker    Packs/day: 1.00    Years: 17.00    Pack years: 17.00    Types: Cigarettes  . Smokeless tobacco: Never Used  Substance Use Topics  . Alcohol use: Yes    Comment: occ    Subjective:   I connected with [name] on 05/22/20 at 10:40 AM EDT by a telephone call and verified that I am speaking with the correct person using two identifiers.   I discussed the limitations of evaluation and management by telemedicine and the availability of in person appointments. The patient expressed understanding and agreed to proceed. Provider in office/ patient is at home; provider and patient are telephone call.   Patient concerned for sinus infection; symptoms present x 1 week; notes she is prone to sinus infections; not using any OTC medications; no concerns for COVID- fully vaccinated;     Objective:  There were no vitals filed for this visit.  General: Well developed, well nourished, in no acute distress  Head: Normocephalic and atraumatic  Lungs: Respirations unlabored;  Neurologic: Alert and oriented;  speech intact; face symmetrical; moves all extremities well; CNII-XII intact without focal deficit  Assessment:  1. Acute sinusitis, recurrence not specified, unspecified location     Plan:  Rx for Augmentin 875 mg bid x 10 days; increase fluids, rest and follow-up worse, no better.  Time spent 7 minutes   No follow-ups on file.  No orders of the defined types were placed in this encounter.   Requested Prescriptions   Signed Prescriptions Disp Refills  . amoxicillin-clavulanate (AUGMENTIN) 875-125 MG tablet 20 tablet 0    Sig: Take 1 tablet by  mouth 2 (two) times daily for 10 days.

## 2020-05-27 NOTE — Progress Notes (Signed)
Virtual Visit via Telephone Note   This visit type was conducted due to national recommendations for restrictions regarding the COVID-19 Pandemic (e.g. social distancing) in an effort to limit this patient's exposure and mitigate transmission in our community.  Due to her co-morbid illnesses, this patient is at least at moderate risk for complications without adequate follow up.  This format is felt to be most appropriate for this patient at this time.  The patient did not have access to video technology/had technical difficulties with video requiring transitioning to audio format only (telephone).  All issues noted in this document were discussed and addressed.  No physical exam could be performed with this format.  Please refer to the patient's chart for her  consent to telehealth for [facility].   Date:  05/28/2020   ID:  [name], DOB 1976/04/25, MRN [id number]  Patient Location: Home Provider Location: Home  PCP:  Evaristo Bury, NP  Cardiologist:  Reatha Harps, MD  Electrophysiologist:  None   Evaluation Performed:  Follow-Up Visit  Chief Complaint:  Follow Up   History of Present Illness:    [name] is a 44 y.o. female who presents for ongoing assessment and management of bradycardia and dizziness. She has a history of sinusitis, vitamin D deficiency, lupus, ongoing tobacco abuse, thrombocytopenia, iron deficiency anemia and prior history of community-acquired pneumonia. She works as a Naval architect. She complained of frequent palpitations and dizziness which occur on a daily basis.She also reported that she feels as if she was having a "panic attack" while driving and she had to pull over because she felt so bad. She had associated dyspnea. She was started on meclizine but had no improvement in her symptoms.  Echocardiogram dated 09/11/2019 revealed normal LVEF of 55 to 60%, there was no LVH, no diastolic dysfunction. There is no evidence of PFO or  valvular abnormalities. Cardiac monitor reviewed and was did not find any abnormalities. HR range from 46-180 with average HR of 76. No SVT or arrhythmias.  On last office visit,dated 12/09/2019 she had no further complaints of racing HR or dizziness. She does experience vertigo when riding in the back seat of a car.   She continue to have generalized arthritis pain and dizziness. She has run out of meclizine.  She has recently been found to be allergic to Valium.   The patient does not  have symptoms concerning for COVID-19 infection (fever, chills, cough, or new shortness of breath).    Past Medical History:  Diagnosis Date  . Hypertension   . Iron deficiency anemia   . Migraine   . Sarcoidosis   . Vertigo    Past Surgical History:  Procedure Laterality Date  . CESAREAN SECTION    . CESAREAN SECTION    . KNEE SURGERY    . TUBAL LIGATION       Current Meds  Medication Sig  . amLODipine (NORVASC) 5 MG tablet TAKE 1 TABLET BY MOUTH EVERY DAY  . amoxicillin-clavulanate (AUGMENTIN) 875-125 MG tablet Take 1 tablet by mouth 2 (two) times daily for 10 days.  . meclizine (ANTIVERT) 25 MG tablet Take 1 tablet (25 mg total) by mouth 3 (three) times daily as needed for dizziness.  . [DISCONTINUED] meclizine (ANTIVERT) 25 MG tablet Take 25 mg by mouth 3 (three) times daily as needed for dizziness.     Allergies:   Fish allergy, Valium [diazepam], Asa [aspirin], Naprosyn [naproxen], and Sulfa antibiotics   Social History  Tobacco Use  . Smoking status: Current Every Day Smoker    Packs/day: 1.00    Years: 17.00    Pack years: 17.00    Types: Cigarettes  . Smokeless tobacco: Never Used  Vaping Use  . Vaping Use: Never used  Substance Use Topics  . Alcohol use: Yes    Comment: occ  . Drug use: No     Family Hx: The patient's family history includes Anemia in her sister; Diabetes in her maternal grandfather, maternal grandmother, and paternal grandmother; Healthy in her  father; Heart failure in her maternal grandmother; Hypertension in her maternal grandmother; Lupus in her mother; Thyroid disease in her maternal grandmother and sister.  ROS:   Please see the history of present illness.    All other systems reviewed and are negative.   Prior CV studies:   The following studies were reviewed today:  Cardiac Monitor 09/17/2019 Patient had a min HR of 46 bpm, max HR of 180 bpm, and avg HR of 75 bpm. Predominant underlying rhythm was Sinus Rhythm. Isolated SVEs were rare (<1.0%), and no SVE Couplets or SVE Triplets were present. Isolated VEs were rare (<1.0%), and no VE Couplets or VE Triplets were present.  Echocardiogram 09/11/2019  1. Left ventricular ejection fraction, by visual estimation, is 55 to 60%. The left ventricle has normal function. Normal left ventricular size. There is no left ventricular hypertrophy. Normal wall motion. Normal diastolic function. 2. Global right ventricle has normal systolic function.The right ventricular size is normal. No increase in right ventricular wall thickness. 3. Left atrial size was normal. 4. Right atrial size was normal. 5. The mitral valve is normal in structure. Trace mitral valve regurgitation. No evidence of mitral stenosis. 6. The tricuspid valve is normal in structure. Tricuspid valve regurgitation is trivial. 7. The aortic valve is normal in structure. Aortic valve regurgitation was not visualized by color flow Doppler. Structurally normal aortic valve, with no evidence of sclerosis or stenosis. 8. The tricuspid regurgitant velocity is 2.16 m/s, and with an assumed right atrial pressure of 3 mmHg, the estimated right ventricular systolic pressure is normal at 21.7 mmHg. 9. The inferior vena cava is normal in size with greater than 50% respiratory variability, suggesting right atrial pressure of 3 mmHg.  Labs/Other Tests and Data Reviewed:    EKG:  No ECG reviewed.  Recent Labs: 09/05/2019:  TSH 0.448 10/29/2019: ALT 12; BUN 7; Creatinine, Ser 0.70; Hemoglobin 12.3; Platelets 269; Potassium 4.0; Sodium 137   Recent Lipid Panel Lab Results  Component Value Date/Time   CHOL 143 07/28/2016 11:19 AM   TRIG 45.0 07/28/2016 11:19 AM   HDL 63.60 07/28/2016 11:19 AM   CHOLHDL 2 07/28/2016 11:19 AM   LDLCALC 71 07/28/2016 11:19 AM    Wt Readings from Last 3 Encounters:  05/28/20 190 lb (86.2 kg)  12/09/19 195 lb 12.8 oz (88.8 kg)  11/06/19 196 lb (88.9 kg)     Objective:    Vital Signs:  BP (!) 133/95   Pulse 61   Ht 5\' 5"  (1.651 m)   Wt 190 lb (86.2 kg)   BMI 31.62 kg/m    VITAL SIGNS:  reviewed GEN:  no acute distress RESPIRATORY:  normal respiratory effort, symmetric expansion NEURO:  alert and oriented x 3, no obvious focal deficit PSYCH:  normal affect  ASSESSMENT & PLAN:    1. Chronic Dizziness:  Refill on meclizine. Referral to neurology by PCP in process.   2. Hypertension: Continue amlodipine. Well  controlled. Check BMET for kidney function.   3. Anemia: Has not had labs checked by PCP. Will check CBC and send the results to PCP.   COVID-19 Education: The signs and symptoms of COVID-19 were discussed with the patient and how to seek care for testing (follow up with PCP or arrange E-visit).  The importance of social distancing was discussed today.  Time:   Today, I have spent 20 minutes with the patient with telehealth technology discussing the above problems.     Medication Adjustments/Labs and Tests Ordered: Current medicines are reviewed at length with the patient today.  Concerns regarding medicines are outlined above.   Tests Ordered: Orders Placed This Encounter  Procedures  . CBC  . Basic Metabolic Panel (BMET)    Medication Changes: Meds ordered this encounter  Medications  . meclizine (ANTIVERT) 25 MG tablet    Sig: Take 1 tablet (25 mg total) by mouth 3 (three) times daily as needed for dizziness.    Dispense:  50 tablet    Refill:   3    Disposition:  Follow up 6 months  Signed, Bettey Mare. Liborio Nixon, ANP, AACC  05/28/2020 11:04 AM    [facility]

## 2020-05-28 ENCOUNTER — Encounter: Payer: Self-pay | Admitting: Adult Health

## 2020-05-28 ENCOUNTER — Telehealth (INDEPENDENT_AMBULATORY_CARE_PROVIDER_SITE_OTHER): Payer: BC Managed Care – PPO | Admitting: Adult Health

## 2020-05-28 VITALS — BP 133/95 | HR 61 | Ht 65.0 in | Wt 190.0 lb

## 2020-05-28 DIAGNOSIS — I10 Essential (primary) hypertension: Secondary | ICD-10-CM | POA: Diagnosis not present

## 2020-05-28 DIAGNOSIS — D649 Anemia, unspecified: Secondary | ICD-10-CM | POA: Diagnosis not present

## 2020-05-28 DIAGNOSIS — Z79899 Other long term (current) drug therapy: Secondary | ICD-10-CM

## 2020-05-28 DIAGNOSIS — R42 Dizziness and giddiness: Secondary | ICD-10-CM

## 2020-05-28 MED ORDER — MECLIZINE HCL 25 MG PO TABS: 25.0000 mg | ORAL_TABLET | Freq: Three times a day (TID) | ORAL | 3 refills | Status: DC | PRN

## 2020-05-28 NOTE — Patient Instructions (Signed)
Medication Instructions:  Continue current medications  *If you need a refill on your cardiac medications before your next appointment, please call your pharmacy*   Lab Work: CBC and BMP  If you have labs (blood work) drawn today and your tests are completely normal, you will receive your results only by: Marland Kitchen MyChart Message (if you have MyChart) OR . A paper copy in the mail If you have any lab test that is abnormal or we need to change your treatment, we will call you to review the results.   Testing/Procedures: None Ordered   Follow-Up: At [facility], you and your health needs are our priority.  As part of our continuing mission to provide you with exceptional heart care, we have created designated Provider Care Teams.  These Care Teams include your primary Cardiologist (physician) and Advanced Practice Providers (APPs -  Physician Assistants and Nurse Practitioners) who all work together to provide you with the care you need, when you need it.  We recommend signing up for the patient portal called "MyChart".  Sign up information is provided on this After Visit Summary.  MyChart is used to connect with patients for Virtual Visits (Telemedicine).  Patients are able to view lab/test results, encounter notes, upcoming appointments, etc.  Non-urgent messages can be sent to your provider as well.   To learn more about what you can do with MyChart, go to [email or web address].    Your next appointment:   3 month(s)  The format for your next appointment:   In Person  Provider:   You may see Reatha Harps, MD or one of the following Advanced Practice Providers on your designated Care Team:    Azalee Course, PA-C  Micah Flesher, PA-C or   Judy Pimple, [place]

## 2020-06-04 ENCOUNTER — Emergency Department (HOSPITAL_COMMUNITY)
Admission: EM | Admit: 2020-06-04 | Discharge: 2020-06-04 | Disposition: A | Discharge status: Home / Self Care | Payer: BC Managed Care – PPO | Attending: Emergency Medicine | Admitting: Emergency Medicine

## 2020-06-04 ENCOUNTER — Other Ambulatory Visit: Payer: Self-pay

## 2020-06-04 ENCOUNTER — Encounter (HOSPITAL_COMMUNITY): Payer: Self-pay | Admitting: Emergency Medicine

## 2020-06-04 ENCOUNTER — Emergency Department (HOSPITAL_COMMUNITY): Payer: BC Managed Care – PPO

## 2020-06-04 DIAGNOSIS — R0789 Other chest pain: Secondary | ICD-10-CM | POA: Diagnosis not present

## 2020-06-04 DIAGNOSIS — R519 Headache, unspecified: Secondary | ICD-10-CM | POA: Diagnosis not present

## 2020-06-04 DIAGNOSIS — Z79899 Other long term (current) drug therapy: Secondary | ICD-10-CM | POA: Diagnosis not present

## 2020-06-04 DIAGNOSIS — F17210 Nicotine dependence, cigarettes, uncomplicated: Secondary | ICD-10-CM | POA: Diagnosis not present

## 2020-06-04 DIAGNOSIS — R079 Chest pain, unspecified: Secondary | ICD-10-CM

## 2020-06-04 DIAGNOSIS — I10 Essential (primary) hypertension: Secondary | ICD-10-CM | POA: Insufficient documentation

## 2020-06-04 DIAGNOSIS — R21 Rash and other nonspecific skin eruption: Secondary | ICD-10-CM | POA: Diagnosis not present

## 2020-06-04 LAB — CBC
HCT: 37.5 % (ref 36.0–46.0)
Hemoglobin: 11.7 g/dL — ABNORMAL LOW (ref 12.0–15.0)
MCH: 28.7 pg (ref 26.0–34.0)
MCHC: 31.2 g/dL (ref 30.0–36.0)
MCV: 91.9 fL (ref 80.0–100.0)
Platelets: 232 10*3/uL (ref 150–400)
RBC: 4.08 MIL/uL (ref 3.87–5.11)
RDW: 13.1 % (ref 11.5–15.5)
WBC: 4.4 10*3/uL (ref 4.0–10.5)
nRBC: 0 % (ref 0.0–0.2)

## 2020-06-04 LAB — BASIC METABOLIC PANEL
Anion gap: 10 (ref 5–15)
BUN: 9 mg/dL (ref 6–20)
CO2: 24 mmol/L (ref 22–32)
Calcium: 8.6 mg/dL — ABNORMAL LOW (ref 8.9–10.3)
Chloride: 104 mmol/L (ref 98–111)
Creatinine, Ser: 0.71 mg/dL (ref 0.44–1.00)
GFR calc Af Amer: >60 mL/min (ref >60)
GFR calc non Af Amer: >60 mL/min (ref >60)
Glucose, Bld: 85 mg/dL (ref 70–99)
Potassium: 3.8 mmol/L (ref 3.5–5.1)
Sodium: 138 mmol/L (ref 135–145)

## 2020-06-04 LAB — TROPONIN I (HIGH SENSITIVITY)
Troponin I (High Sensitivity): 2 ng/L (ref <18)
Troponin I (High Sensitivity): <2 ng/L (ref <18)

## 2020-06-04 LAB — POC URINE PREG, ED: Preg Test, Ur: NEGATIVE

## 2020-06-04 MED ORDER — SODIUM CHLORIDE 0.9% FLUSH: 3.0000 mL | Freq: Once | INTRAVENOUS | Status: DC

## 2020-06-04 NOTE — Discharge Instructions (Signed)
You were seen in the emergency department today for chest pain. Your work-up in the emergency department has been overall reassuring. Your labs have been fairly normal and or similar to previous blood work you have had done. Your EKG and the enzyme we use to check your heart did not show an acute heart attack at this time. Your chest x-ray was normal.   I suspect that most of your symptoms today were due to missing her blood pressure for the past 4-5 days.  Please make sure you are taking this regularly and follow-up with your primary care doctor regarding your hypertension.  We would like you to follow up closely with your primary care provider and/or the cardiologist provided in your discharge instructions within 1-3 days. Return to the ER immediately should you experience any new or worsening symptoms including but not limited to return of pain, worsened pain, vomiting, shortness of breath, dizziness, lightheadedness, passing out, or any other concerns that you may have.

## 2020-06-04 NOTE — ED Notes (Addendum)
Patient is taking 5mg  of Norvasc per PA. Patient forgot to take her medication for 5 days.

## 2020-06-04 NOTE — ED Triage Notes (Signed)
Patient complaining of chest pain across her chest and high blood pressure. Patient is complaining of a headache also. Patient states this started at 1 am.

## 2020-06-04 NOTE — ED Provider Notes (Signed)
[facility]-EMERGENCY DEPT Provider Note   CSN: [id number] Arrival date & time: 06/04/20  0200     History Chief Complaint  Patient presents with  . Chest Pain    RAYLEEN WYRICK is a 44 y.o. female.  ELIYANA PAGLIARO is a 44 y.o. female with a history of hypertension, sarcoidosis, migraine, vertigo and iron deficiency anemia, who presents to the emergency department for evaluation of chest pain, headache and high blood pressure.  She states around midnight she had started to notice a gradual onset headache and some left-sided chest pain.  She took her blood pressure and noted it was 188/115.  She checked it again and got a systolic blood pressure of 190.  She states she continued to have some mild left-sided chest pain which worried her so she decided to come in.  She denies any associated shortness of breath.  No lightheadedness or syncope.  No vision changes, she has noted some dizziness but does have chronic vertigo.  Reports this is not worse or different than the usual vertigo she experiences.  No numbness weakness or tingling.  No lower extremity swelling.  She reports that she is supposed to take 5 mg of amlodipine daily but states that she went out of town 4 days ago and has not taken her blood pressure medication since then because she forgot to bring it with her.  She has not taken it today either.  No other aggravating or alleviating factors.        Past Medical History:  Diagnosis Date  . Hypertension   . Iron deficiency anemia   . Migraine   . Sarcoidosis   . Vertigo     Patient Active Problem List   Diagnosis Date Noted  . Degenerative arthritis of knee, bilateral 10/07/2019  . Suspected COVID-19 virus infection 05/07/2019  . Chronic pain of left ankle 04/13/2018  . Sinusitis 11/25/2016  . Vitamin D deficiency 07/28/2016  . Routine general medical examination at a health care facility 04/25/2016  . Obesity 04/25/2016  . Tobacco abuse  04/25/2016  . Community acquired pneumonia 09/28/2014  . Anemia 09/28/2014  . Menorrhagia 09/28/2014  . Thrombocytopenia (HCC) 09/28/2014  . CAP (community acquired pneumonia) 09/28/2014    Past Surgical History:  Procedure Laterality Date  . CESAREAN SECTION    . CESAREAN SECTION    . KNEE SURGERY    . TUBAL LIGATION       OB History   No obstetric history on file.     Family History  Problem Relation Age of Onset  . Lupus Mother   . Healthy Father   . Heart failure Maternal Grandmother   . Diabetes Maternal Grandmother   . Hypertension Maternal Grandmother   . Thyroid disease Maternal Grandmother   . Diabetes Maternal Grandfather   . Diabetes Paternal Grandmother   . Thyroid disease Sister   . Anemia Sister     Social History   Tobacco Use  . Smoking status: Current Every Day Smoker    Packs/day: 1.00    Years: 17.00    Pack years: 17.00    Types: Cigarettes  . Smokeless tobacco: Never Used  Vaping Use  . Vaping Use: Never used  Substance Use Topics  . Alcohol use: Yes    Comment: occ  . Drug use: No    Home Medications Prior to Admission medications   Medication Sig Start Date End Date Taking? Authorizing Provider  amLODipine (NORVASC) 5 MG tablet TAKE  1 TABLET BY MOUTH EVERY DAY 05/18/20   Olive Bass, FNP  meclizine (ANTIVERT) 25 MG tablet Take 1 tablet (25 mg total) by mouth 3 (three) times daily as needed for dizziness. 05/28/20   Jodelle Gross, NP    Allergies    Fish allergy, Naproxen, Other, Sulfa antibiotics, Valium [diazepam], and Asa [aspirin]  Review of Systems   Review of Systems  Constitutional: Negative for chills and fever.  HENT: Negative.   Eyes: Negative for visual disturbance.  Respiratory: Negative for cough and shortness of breath.   Cardiovascular: Positive for chest pain. Negative for palpitations and leg swelling.  Gastrointestinal: Negative for abdominal pain, nausea and vomiting.  Genitourinary: Negative  for dysuria.  Musculoskeletal: Negative for arthralgias and myalgias.  Skin: Negative for color change and rash.  Neurological: Positive for dizziness and headaches. Negative for syncope, facial asymmetry, weakness, light-headedness and numbness.    Physical Exam Updated Vital Signs BP 126/81 (BP Location: Right Arm)   Pulse (!) 53   Temp 98.7 F (37.1 C) (Oral)   Resp 14   Ht 5\' 5"  (1.651 m)   Wt 88.5 kg   SpO2 100%   BMI 32.45 kg/m   Physical Exam Vitals and nursing note reviewed.  Constitutional:      General: She is not in acute distress.    Appearance: She is well-developed. She is not ill-appearing or diaphoretic.  HENT:     Head: Normocephalic and atraumatic.  Eyes:     General:        Right eye: No discharge.        Left eye: No discharge.     Pupils: Pupils are equal, round, and reactive to light.  Cardiovascular:     Rate and Rhythm: Normal rate and regular rhythm.     Pulses:          Radial pulses are 2+ on the right side and 2+ on the left side.     Heart sounds: Normal heart sounds.  Pulmonary:     Effort: Pulmonary effort is normal. No respiratory distress.     Breath sounds: Normal breath sounds. No wheezing or rales.     Comments: Respirations equal and unlabored, patient able to speak in full sentences, lungs clear to auscultation bilaterally Chest:     Chest wall: No tenderness.  Abdominal:     General: Bowel sounds are normal. There is no distension.     Palpations: Abdomen is soft. There is no mass.     Tenderness: There is no abdominal tenderness. There is no guarding.     Comments: Abdomen soft, nondistended, nontender to palpation in all quadrants without guarding or peritoneal signs  Musculoskeletal:        General: No deformity.     Cervical back: Neck supple.     Right lower leg: No tenderness. No edema.     Left lower leg: No tenderness. No edema.  Skin:    General: Skin is warm and dry.     Capillary Refill: Capillary refill takes  less than 2 seconds.  Neurological:     Mental Status: She is alert.     Coordination: Coordination normal.     Comments: Speech is clear, able to follow commands CN III-XII intact Normal strength in upper and lower extremities bilaterally including dorsiflexion and plantar flexion, strong and equal grip strength Sensation normal to light and sharp touch Moves extremities without ataxia, coordination intact   Psychiatric:  Mood and Affect: Mood normal.        Behavior: Behavior normal.     ED Results / Procedures / Treatments   Labs (all labs ordered are listed, but only abnormal results are displayed) Labs Reviewed  BASIC METABOLIC PANEL - Abnormal; Notable for the following components:      Result Value   Calcium 8.6 (*)    All other components within normal limits  CBC - Abnormal; Notable for the following components:   Hemoglobin 11.7 (*)    All other components within normal limits  POC URINE PREG, ED  TROPONIN I (HIGH SENSITIVITY)  TROPONIN I (HIGH SENSITIVITY)    EKG None  Radiology DG Chest 2 View  Result Date: 06/04/2020 CLINICAL DATA:  44 year old female with chest pain. EXAM: CHEST - 2 VIEW COMPARISON:  Chest radiograph dated 10/23/2019. FINDINGS: The heart size and mediastinal contours are within normal limits. Both lungs are clear. The visualized skeletal structures are unremarkable. IMPRESSION: No active cardiopulmonary disease. Electronically Signed   By: Elgie Collard M.D.   On: 06/04/2020 02:42    Procedures Procedures (including critical care time)  Medications Ordered in ED Medications - No data to display  ED Course  I have reviewed the triage vital signs and the nursing notes.  Pertinent labs & imaging results that were available during my care of the patient were reviewed by me and considered in my medical decision making (see chart for details).    MDM Rules/Calculators/A&P                          Patient presents to the  emergency department with chest pain. Patient nontoxic appearing, in no apparent distress, vitals without significant abnormality aside from hypertension.  Patient states that she has been without her amlodipine over the past 4-5 days while she was out of town.  Tonight after developing chest pain and headache she checked her blood pressure and noted it to be 188/115.  Fairly benign physical exam.   DDX: ACS, pulmonary embolism, dissection, pneumothorax, pneumonia, arrhythmia, severe anemia, MSK, GERD, anxiety. Evaluation initiated with labs, EKG, and CXR. Patient on cardiac monitor.   CBC: No leukocytosis, stable hemoglobin BMP: Calcium of 8.6, no other electrolyte derangements, normal renal function Troponin: Negative x2 EKG: Sinus rhythm without ischemic changes CXR: Negative, without infiltrate, effusion, pneumothorax, or fracture/dislocation.   EKG without obvious acute ischemia, delta troponin negative, doubt ACS. Patient is low risk wells, PERC negative, doubt pulmonary embolism. Pain is not a tearing sensation, symmetric pulses, no widening of mediastinum on CXR, doubt dissection. Cardiac monitor reviewed, no notable arrhythmias or tachycardia.  Patient has normal neurologic exam and headache has resolved.  Patient has taken her home blood pressure medication here in the emergency department and her blood pressure has significantly improved.  Patient has appeared hemodynamically stable throughout ER visit and appears safe for discharge with close PCP/cardiology follow up. I discussed results, treatment plan, need for PCP follow-up, and return precautions with the patient. Provided opportunity for questions, patient confirmed understanding and is in agreement with plan.   BP 126/81 (BP Location: Right Arm)   Pulse (!) 53   Temp 98.7 F (37.1 C) (Oral)   Resp 14   Ht 5\' 5"  (1.651 m)   Wt 88.5 kg   SpO2 100%   BMI 32.45 kg/m    Final Clinical Impression(s) / ED Diagnoses Final  diagnoses:  Hypertension, unspecified type  Left-sided chest pain  Acute nonintractable headache, unspecified headache type    Rx / DC Orders ED Discharge Orders    None       Legrand RamsFord, Nevayah Faust N, PA-C 06/04/20 0912    Nira Connardama, Pedro Eduardo, MD 06/05/20 [id number]

## 2020-06-15 DIAGNOSIS — R410 Disorientation, unspecified: Secondary | ICD-10-CM | POA: Diagnosis not present

## 2020-06-15 DIAGNOSIS — Z8669 Personal history of other diseases of the nervous system and sense organs: Secondary | ICD-10-CM | POA: Diagnosis not present

## 2020-06-15 DIAGNOSIS — R2689 Other abnormalities of gait and mobility: Secondary | ICD-10-CM | POA: Diagnosis not present

## 2020-06-15 DIAGNOSIS — R110 Nausea: Secondary | ICD-10-CM | POA: Diagnosis not present

## 2020-06-26 ENCOUNTER — Telehealth: Payer: Self-pay | Admitting: Adult Health

## 2020-06-26 NOTE — Telephone Encounter (Signed)
Completed FMLA paperwork completed & signed by Joni Reining, NP.  Placed at front desk for patient to pickup 06/26/20  [facility]

## 2020-08-12 ENCOUNTER — Other Ambulatory Visit: Payer: Self-pay | Admitting: Family

## 2020-08-24 NOTE — Progress Notes (Signed)
Cardiology Office Note:   Date:  08/27/2020  NAME:  [name]    MRN: [id number] DOB:  Apr 19, 1976   PCP:  Evaristo Bury, NP  Cardiologist:  Reatha Harps, MD  Electrophysiologist:  None   Referring MD: Evaristo Bury, NP   Chief Complaint  Patient presents with  . Dizziness   History of Present Illness:   [name] is a 44 y.o. female with a hx of HTN who presents for follow-up of dizziness. Normal echo. Normal monitor. No need for further cardiac evaluation.  She reports for the last several years has had intermittent dizziness.  Reports it is occurring daily.  Reports when she turns her head or changes position she can get blurry vision and nausea.  Can occur when laying down.  Can occur anytime.  She reports can also occur with walking.  Associated symptoms include nausea.  I did review her recent monitor which shows no significant arrhythmias or bradycardia.  I did review her EKG from July 2021 which shows sinus bradycardia heart rate 51 with no evidence of infarction or ischemic changes.  She has normal intervals.  Her echocardiogram also shows normal LV function with no significant LVH or underlying pathology.  She does report she has sarcoidosis but is not being treated for this.  She is unclear of who diagnosis where this came from.  She does report that the meclizine does help her symptoms of dizziness.  Apparently she was evaluated by ENT in [place].  They tried maneuvers and this did not help her.  She had a brain MRI in January 2021 that was normal.  She reports that ENT referred her to neurology for this is not taken place.  She denies any chest pain or shortness of breath.  Her cardiovascular exam is normal today.  Past Medical History: Past Medical History:  Diagnosis Date  . Hypertension   . Iron deficiency anemia   . Migraine   . Sarcoidosis   . Vertigo     Past Surgical History: Past Surgical History:  Procedure Laterality Date  .  CESAREAN SECTION    . CESAREAN SECTION    . KNEE SURGERY    . TUBAL LIGATION      Current Medications: Current Meds  Medication Sig  . amLODipine (NORVASC) 5 MG tablet TAKE 1 TABLET BY MOUTH EVERY DAY  . meclizine (ANTIVERT) 25 MG tablet Take 1 tablet (25 mg total) by mouth 3 (three) times daily as needed for dizziness.     Allergies:    Fish allergy, Naproxen, Other, Sulfa antibiotics, Valium [diazepam], and Asa [aspirin]   Social History: Social History   Socioeconomic History  . Marital status: Married    Spouse name: Not on file  . Number of children: 3  . Years of education: 44  . Highest education level: Not on file  Occupational History  . Occupation: Hospital doctor  Tobacco Use  . Smoking status: Current Every Day Smoker    Packs/day: 1.00    Years: 17.00    Pack years: 17.00    Types: Cigarettes  . Smokeless tobacco: Never Used  Vaping Use  . Vaping Use: Never used  Substance and Sexual Activity  . Alcohol use: Yes    Comment: occ  . Drug use: No  . Sexual activity: Not on file  Other Topics Concern  . Not on file  Social History Narrative   Fun: Dancing   Denies abuse and feels safe at home  Social Determinants of Health   Financial Resource Strain:   . Difficulty of Paying Living Expenses: Not on file  Food Insecurity:   . Worried About Programme researcher, broadcasting/film/video in the Last Year: Not on file  . Ran Out of Food in the Last Year: Not on file  Transportation Needs:   . Lack of Transportation (Medical): Not on file  . Lack of Transportation (Non-Medical): Not on file  Physical Activity:   . Days of Exercise per Week: Not on file  . Minutes of Exercise per Session: Not on file  Stress:   . Feeling of Stress : Not on file  Social Connections:   . Frequency of Communication with Friends and Family: Not on file  . Frequency of Social Gatherings with Friends and Family: Not on file  . Attends Religious Services: Not on file  . Active Member of Clubs or  Organizations: Not on file  . Attends Banker Meetings: Not on file  . Marital Status: Not on file     Family History: The patient's family history includes Anemia in her sister; Diabetes in her maternal grandfather, maternal grandmother, and paternal grandmother; Healthy in her father; Heart failure in her maternal grandmother; Hypertension in her maternal grandmother; Lupus in her mother; Thyroid disease in her maternal grandmother and sister.  ROS:   All other ROS reviewed and negative. Pertinent positives noted in the HPI.     EKGs/Labs/Other Studies Reviewed:   The following studies were personally reviewed by me today:  [place]  1. Brief supraventricular tachycardia not associated with patient triggered event.  2. No sustained arrhythmias.  3. Rare Ectopy.  4. No atrial fibrillation.   TTE 09/11/2019 1. Left ventricular ejection fraction, by visual estimation, is 55 to  60%. The left ventricle has normal function. Normal left ventricular size.  There is no left ventricular hypertrophy. Normal wall motion. Normal  diastolic function.  2. Global right ventricle has normal systolic function.The right  ventricular size is normal. No increase in right ventricular wall  thickness.  3. Left atrial size was normal.  4. Right atrial size was normal.  5. The mitral valve is normal in structure. Trace mitral valve  regurgitation. No evidence of mitral stenosis.  6. The tricuspid valve is normal in structure. Tricuspid valve  regurgitation is trivial.  7. The aortic valve is normal in structure. Aortic valve regurgitation  was not visualized by color flow Doppler. Structurally normal aortic  valve, with no evidence of sclerosis or stenosis.  8. The tricuspid regurgitant velocity is 2.16 m/s, and with an assumed  right atrial pressure of 3 mmHg, the estimated right ventricular systolic  pressure is normal at 21.7 mmHg.  9. The inferior vena cava is normal in size  with greater than 50%  respiratory variability, suggesting right atrial pressure of 3 mmHg.   Recent Labs: 09/05/2019: TSH 0.448 10/29/2019: ALT 12 06/04/2020: BUN 9; Creatinine, Ser 0.71; Hemoglobin 11.7; Platelets 232; Potassium 3.8; Sodium 138   Recent Lipid Panel    Component Value Date/Time   CHOL 143 07/28/2016 1119   TRIG 45.0 07/28/2016 1119   HDL 63.60 07/28/2016 1119   CHOLHDL 2 07/28/2016 1119   VLDL 9.0 07/28/2016 1119   LDLCALC 71 07/28/2016 1119    Physical Exam:   VS:  BP 115/73   Pulse 85   Temp (!) 94.1 F (34.5 C)   Ht 5\' 5"  (1.651 m)   Wt 194 lb 9.6 oz (88.3 kg)  SpO2 99%   BMI 32.38 kg/m    Wt Readings from Last 3 Encounters:  08/27/20 194 lb 9.6 oz (88.3 kg)  06/04/20 195 lb (88.5 kg)  05/28/20 190 lb (86.2 kg)    General: Well nourished, well developed, in no acute distress Heart: Atraumatic, normal size  Eyes: PEERLA, EOMI  Neck: Supple, no JVD Endocrine: No thryomegaly Cardiac: Normal S1, S2; RRR; no murmurs, rubs, or gallops Lungs: Clear to auscultation bilaterally, no wheezing, rhonchi or rales  Abd: Soft, nontender, no hepatomegaly  Ext: No edema, pulses 2+ Musculoskeletal: No deformities, BUE and BLE strength normal and equal Skin: Warm and dry, no rashes   Neuro: Alert and oriented to person, place, time, and situation, CNII-XII grossly intact, no focal deficits  Psych: Normal mood and affect   ASSESSMENT:   [name] is a 44 y.o. female who presents for the following: 1. Dizziness     PLAN:   1. Dizziness -Daily episodes of dizziness.  Normal monitor.  Normal echo.  Symptoms occur with position change.  She has no significant findings on her EKG.  EKGs demonstrate sinus bradycardia with normal intervals.  This is noncardiac.  Brain MRI is normal.  ENT evaluation has been not helpful.  I recommend she see neurology.  We refilled her meclizine today.  She also requested vitamin D refill.  We have also referred her to neurology.   We will see her as needed.  Disposition: Return if symptoms worsen or fail to improve.  Medication Adjustments/Labs and Tests Ordered: Current medicines are reviewed at length with the patient today.  Concerns regarding medicines are outlined above.  No orders of the defined types were placed in this encounter.  No orders of the defined types were placed in this encounter.   Patient Instructions  Medication Instructions:  Start Meclizine 25 mg three times daily as needed for dizziness  Start Calcium-Vitamin D supplement    *If you need a refill on your cardiac medications before your next appointment, please call your pharmacy*  Follow-Up: At [facility], you and your health needs are our priority.  As part of our continuing mission to provide you with exceptional heart care, we have created designated Provider Care Teams.  These Care Teams include your primary Cardiologist (physician) and Advanced Practice Providers (APPs -  Physician Assistants and Nurse Practitioners) who all work together to provide you with the care you need, when you need it.  We recommend signing up for the patient portal called "MyChart".  Sign up information is provided on this After Visit Summary.  MyChart is used to connect with patients for Virtual Visits (Telemedicine).  Patients are able to view lab/test results, encounter notes, upcoming appointments, etc.  Non-urgent messages can be sent to your provider as well.   To learn more about what you can do with MyChart, go to [email or web address].    Your next appointment:   As needed  The format for your next appointment:   In Person  Provider:   Lennie OdorWesley O'Neal, MD   Other Instructions Referral to Neuro- they will contact you to set up an appointment.     Time Spent with Patient: I have spent a total of 25 minutes with patient reviewing hospital notes, telemetry, EKGs, labs and examining the patient as well as establishing an assessment and plan  that was discussed with the patient.  > 50% of time was spent in direct patient care.  Signed, Lenna GilfordWesley T. Flora Lipps'Neal, MD [facility]  [address], [address] [place], [place] [address] [phone]  08/27/2020 3:26 PM

## 2020-08-27 ENCOUNTER — Other Ambulatory Visit: Payer: Self-pay

## 2020-08-27 ENCOUNTER — Encounter: Payer: Self-pay | Admitting: Cardiovascular Disease

## 2020-08-27 ENCOUNTER — Ambulatory Visit (INDEPENDENT_AMBULATORY_CARE_PROVIDER_SITE_OTHER): Payer: BC Managed Care – PPO | Admitting: Cardiovascular Disease

## 2020-08-27 VITALS — BP 115/73 | HR 85 | Temp 94.1°F | Ht 65.0 in | Wt 194.6 lb

## 2020-08-27 DIAGNOSIS — R42 Dizziness and giddiness: Secondary | ICD-10-CM | POA: Diagnosis not present

## 2020-08-27 MED ORDER — RA HI-CAL PLUS VITAMIN D 500-200 MG-UNIT PO TABS: 1.0000 | ORAL_TABLET | Freq: Every day | ORAL | 1 refills | Status: DC

## 2020-08-27 MED ORDER — MECLIZINE HCL 25 MG PO TABS: 25.0000 mg | ORAL_TABLET | Freq: Three times a day (TID) | ORAL | 3 refills | Status: DC | PRN

## 2020-08-27 NOTE — Patient Instructions (Signed)
Medication Instructions:  Start Meclizine 25 mg three times daily as needed for dizziness  Start Calcium-Vitamin D supplement    *If you need a refill on your cardiac medications before your next appointment, please call your pharmacy*  Follow-Up: At [facility], you and your health needs are our priority.  As part of our continuing mission to provide you with exceptional heart care, we have created designated Provider Care Teams.  These Care Teams include your primary Cardiologist (physician) and Advanced Practice Providers (APPs -  Physician Assistants and Nurse Practitioners) who all work together to provide you with the care you need, when you need it.  We recommend signing up for the patient portal called "MyChart".  Sign up information is provided on this After Visit Summary.  MyChart is used to connect with patients for Virtual Visits (Telemedicine).  Patients are able to view lab/test results, encounter notes, upcoming appointments, etc.  Non-urgent messages can be sent to your provider as well.   To learn more about what you can do with MyChart, go to [email or web address].    Your next appointment:   As needed  The format for your next appointment:   In Person  Provider:   Lennie Odor, MD   Other Instructions Referral to Neuro- they will contact you to set up an appointment.

## 2020-09-03 ENCOUNTER — Ambulatory Visit: Payer: Medicaid Other | Admitting: Neurology

## 2020-09-04 ENCOUNTER — Telehealth: Payer: BC Managed Care – PPO | Admitting: Family Medicine

## 2020-09-04 ENCOUNTER — Encounter: Payer: Self-pay | Admitting: Family Medicine

## 2020-09-04 ENCOUNTER — Other Ambulatory Visit: Payer: Self-pay

## 2020-09-04 NOTE — Progress Notes (Signed)
Unable to reach pt for video visit.

## 2020-09-07 ENCOUNTER — Other Ambulatory Visit: Payer: Self-pay

## 2020-09-07 ENCOUNTER — Telehealth (INDEPENDENT_AMBULATORY_CARE_PROVIDER_SITE_OTHER): Payer: BC Managed Care – PPO | Admitting: Family Medicine

## 2020-09-07 ENCOUNTER — Encounter: Payer: Self-pay | Admitting: Family Medicine

## 2020-09-07 VITALS — Wt 194.0 lb

## 2020-09-07 DIAGNOSIS — U071 COVID-19: Secondary | ICD-10-CM

## 2020-09-07 MED ORDER — FLUTICASONE PROPIONATE 50 MCG/ACT NA SUSP: 1.0000 | Freq: Every day | NASAL | 0 refills | Status: DC

## 2020-09-07 NOTE — Progress Notes (Signed)
Virtual Visit via Telephone Note  I connected with [name] on 09/07/20 at  2:00 PM EDT by telephone and verified that I am speaking with the correct person using two identifiers.   I discussed the limitations, risks, security and privacy concerns of performing an evaluation and management service by telephone and the availability of in person appointments. I also discussed with the patient that there may be a patient responsible charge related to this service. The patient expressed understanding and agreed to proceed.  Location patient: home Location provider: work or home office Participants present for the call: patient, provider Patient did not have a visit in the prior 7 days to address this/these issue(s).   History of Present Illness: Pt is a 44 yo female with pmh sig for HTN, tobacco use tested positive for COVID on Wed.  Pt endorses coughing, rhinorrhea, sore throat, sneezing, back pain, chills,and HA.  Unsure of fever.  Having occasional facial pressure and wheezing..  Denies ear pain or pressure, changes in appetite.  Pt eating citrus fruits, garlic, taking Tylenol severe cold, and drinking tea.  Pt walking around her neighborhood to get fresh air.  Pt is fully vaccinated.   Observations/Objective: Patient sounds cheerful and well on the phone. I do not appreciate any SOB. Speech and thought processing are grossly intact. Patient reported vitals:   Assessment and Plan: COVID-19 virus infection -pt tested positive 5 days ago -discussed supportive care. -will send message for pt to possibly receive ab infusion. -Given precautions -Plan: flonase   Follow Up Instructions: F/u prn  I did not refer this patient for an OV in the next 24 hours for this/these issue(s).  I discussed the assessment and treatment plan with the patient. The patient was provided an opportunity to ask questions and all were answered. The patient agreed with the plan and demonstrated an  understanding of the instructions.   The patient was advised to call back or seek an in-person evaluation if the symptoms worsen or if the condition fails to improve as anticipated.  I provided 16 minutes of non-face-to-face time during this encounter.   Deeann Saint, MD

## 2020-09-07 NOTE — Progress Notes (Signed)
Pt did not join video visit.  Links sent by this provider and by CMA.

## 2020-09-08 ENCOUNTER — Ambulatory Visit (HOSPITAL_COMMUNITY)
Admission: RE | Admit: 2020-09-08 | Discharge: 2020-09-08 | Disposition: A | Discharge status: Home / Self Care | Payer: BC Managed Care – PPO | Source: Ambulatory Visit | Attending: Pulmonary Disease | Admitting: Pulmonary Disease

## 2020-09-08 ENCOUNTER — Other Ambulatory Visit: Payer: Self-pay | Admitting: Nurse Practitioner

## 2020-09-08 DIAGNOSIS — U071 COVID-19: Secondary | ICD-10-CM

## 2020-09-08 MED ORDER — EPINEPHRINE 0.3 MG/0.3ML IJ SOAJ: 0.3000 mg | Freq: Once | INTRAMUSCULAR | Status: DC | PRN

## 2020-09-08 MED ORDER — METHYLPREDNISOLONE SODIUM SUCC 125 MG IJ SOLR: 125.0000 mg | Freq: Once | INTRAMUSCULAR | Status: DC | PRN

## 2020-09-08 MED ORDER — SODIUM CHLORIDE 0.9 % IV SOLN: Freq: Once | INTRAVENOUS | Status: AC

## 2020-09-08 MED ORDER — SODIUM CHLORIDE 0.9 % IV SOLN: INTRAVENOUS | Status: DC | PRN

## 2020-09-08 MED ORDER — ALBUTEROL SULFATE HFA 108 (90 BASE) MCG/ACT IN AERS: 2.0000 | INHALATION_SPRAY | Freq: Once | RESPIRATORY_TRACT | Status: DC | PRN

## 2020-09-08 MED ORDER — FAMOTIDINE IN NACL 20-0.9 MG/50ML-% IV SOLN: 20.0000 mg | Freq: Once | INTRAVENOUS | Status: DC | PRN

## 2020-09-08 MED ORDER — DIPHENHYDRAMINE HCL 50 MG/ML IJ SOLN: 50.0000 mg | Freq: Once | INTRAMUSCULAR | Status: DC | PRN

## 2020-09-08 NOTE — Progress Notes (Signed)
  Diagnosis: COVID-19  Physician: Dr. Wright  Procedure: [facility] Infusion [facility] Med: bamlanivimab\etesevimab infusion - Provided patient with bamlanimivab\etesevimab fact sheet for patients, parents and caregivers prior to infusion.  Complications: No immediate complications noted.  Discharge: Discharged home   Ansah-Mensah, Jossette Zirbel 09/08/2020   

## 2020-09-08 NOTE — Progress Notes (Signed)
I connected by phone with Dyann Ruddle on 09/08/2020 at 8:41 AM to discuss the potential use of an new treatment for mild to moderate COVID-19 viral infection in non-hospitalized patients.  This patient is a 44 y.o. female that meets the FDA criteria for Emergency Use Authorization of bamlanivimab/etesevimab or casirivimab\imdevimab.  Has a (+) direct SARS-CoV-2 viral test result  Has mild or moderate COVID-19   Is ? 45 years of age and weighs ? 40 kg  Is NOT hospitalized due to COVID-19  Is NOT requiring oxygen therapy or requiring an increase in baseline oxygen flow rate due to COVID-19  Is within 10 days of symptom onset  Has at least one of the high risk factor(s) for progression to severe COVID-19 and/or hospitalization as defined in EUA.  Specific high risk criteria : BMI > 25, Cardiovascular disease or hypertension and Other high risk medical condition per CDC:  SVI   I have spoken and communicated the following to the patient or parent/caregiver:  1. FDA has authorized the emergency use of bamlanivimab/etesevimab and casirivimab\imdevimab for the treatment of mild to moderate COVID-19 in adults and pediatric patients with positive results of direct SARS-CoV-2 viral testing who are 20 years of age and older weighing at least 40 kg, and who are at high risk for progressing to severe COVID-19 and/or hospitalization.  2. The significant known and potential risks and benefits of bamlanivimab/etesevimab and casirivimab\imdevimab, and the extent to which such potential risks and benefits are unknown.  3. Information on available alternative treatments and the risks and benefits of those alternatives, including clinical trials.  4. Patients treated with bamlanivimab/etesevimab and casirivimab\imdevimab should continue to self-isolate and use infection control measures (e.g., wear mask, isolate, social distance, avoid sharing personal items, clean and disinfect "high touch" surfaces,  and frequent handwashing) according to CDC guidelines.   5. The patient or parent/caregiver has the option to accept or refuse bamlanivimab/evesevimab or casirivimab\imdevimab .  After reviewing this information with the patient, the patient has agreed to receive one of the available covid 19 monoclonal antibodies and will be provided an appropriate fact sheet prior to infusion.Consuello Masse, DNP, AGNP-C [phone] (Infusion Center Hotline)

## 2020-09-08 NOTE — Discharge Instructions (Signed)
COVID-19 COVID-19 is a respiratory infection that is caused by a virus called severe acute respiratory syndrome coronavirus 2 (SARS-CoV-2). The disease is also known as coronavirus disease or novel coronavirus. In some people, the virus may not cause any symptoms. In others, it may cause a serious infection. The infection can get worse quickly and can lead to complications, such as:  Pneumonia, or infection of the lungs.  Acute respiratory distress syndrome or ARDS. This is a condition in which fluid build-up in the lungs prevents the lungs from filling with air and passing oxygen into the blood.  Acute respiratory failure. This is a condition in which there is not enough oxygen passing from the lungs to the body or when carbon dioxide is not passing from the lungs out of the body.  Sepsis or septic shock. This is a serious bodily reaction to an infection.  Blood clotting problems.  Secondary infections due to bacteria or fungus.  Organ failure. This is when your body's organs stop working. The virus that causes COVID-19 is contagious. This means that it can spread from person to person through droplets from coughs and sneezes (respiratory secretions). What are the causes? This illness is caused by a virus. You may catch the virus by:  Breathing in droplets from an infected person. Droplets can be spread by a person breathing, speaking, singing, coughing, or sneezing.  Touching something, like a table or a doorknob, that was exposed to the virus (contaminated) and then touching your mouth, nose, or eyes. What increases the risk? Risk for infection You are more likely to be infected with this virus if you:  Are within 6 feet (2 meters) of a person with COVID-19.  Provide care for or live with a person who is infected with COVID-19.  Spend time in crowded indoor spaces or live in shared housing. Risk for serious illness You are more likely to become seriously ill from the virus if  you:  Are 50 years of age or older. The higher your age, the more you are at risk for serious illness.  Live in a nursing home or long-term care facility.  Have cancer.  Have a long-term (chronic) disease such as: ? Chronic lung disease, including chronic obstructive pulmonary disease or asthma. ? A long-term disease that lowers your body's ability to fight infection (immunocompromised). ? Heart disease, including heart failure, a condition in which the arteries that lead to the heart become narrow or blocked (coronary artery disease), a disease which makes the heart muscle thick, weak, or stiff (cardiomyopathy). ? Diabetes. ? Chronic kidney disease. ? Sickle cell disease, a condition in which red blood cells have an abnormal "sickle" shape. ? Liver disease.  Are obese. What are the signs or symptoms? Symptoms of this condition can range from mild to severe. Symptoms may appear any time from 2 to 14 days after being exposed to the virus. They include:  A fever or chills.  A cough.  Difficulty breathing.  Headaches, body aches, or muscle aches.  Runny or stuffy (congested) nose.  A sore throat.  New loss of taste or smell. Some people may also have stomach problems, such as nausea, vomiting, or diarrhea. Other people may not have any symptoms of COVID-19. How is this diagnosed? This condition may be diagnosed based on:  Your signs and symptoms, especially if: ? You live in an area with a COVID-19 outbreak. ? You recently traveled to or from an area where the virus is common. ? You   provide care for or live with a person who was diagnosed with COVID-19. ? You were exposed to a person who was diagnosed with COVID-19.  A physical exam.  Lab tests, which may include: ? Taking a sample of fluid from the back of your nose and throat (nasopharyngeal fluid), your nose, or your throat using a swab. ? A sample of mucus from your lungs (sputum). ? Blood tests.  Imaging tests,  which may include, X-rays, CT scan, or ultrasound. How is this treated? At present, there is no medicine to treat COVID-19. Medicines that treat other diseases are being used on a trial basis to see if they are effective against COVID-19. Your health care provider will talk with you about ways to treat your symptoms. For most people, the infection is mild and can be managed at home with rest, fluids, and over-the-counter medicines. Treatment for a serious infection usually takes places in a hospital intensive care unit (ICU). It may include one or more of the following treatments. These treatments are given until your symptoms improve.  Receiving fluids and medicines through an IV.  Supplemental oxygen. Extra oxygen is given through a tube in the nose, a face mask, or a hood.  Positioning you to lie on your stomach (prone position). This makes it easier for oxygen to get into the lungs.  Continuous positive airway pressure (CPAP) or bi-level positive airway pressure (BPAP) machine. This treatment uses mild air pressure to keep the airways open. A tube that is connected to a motor delivers oxygen to the body.  Ventilator. This treatment moves air into and out of the lungs by using a tube that is placed in your windpipe.  Tracheostomy. This is a procedure to create a hole in the neck so that a breathing tube can be inserted.  Extracorporeal membrane oxygenation (ECMO). This procedure gives the lungs a chance to recover by taking over the functions of the heart and lungs. It supplies oxygen to the body and removes carbon dioxide. Follow these instructions at home: Lifestyle  If you are sick, stay home except to get medical care. Your health care provider will tell you how long to stay home. Call your health care provider before you go for medical care.  Rest at home as told by your health care provider.  Do not use any products that contain nicotine or tobacco, such as cigarettes,  e-cigarettes, and chewing tobacco. If you need help quitting, ask your health care provider.  Return to your normal activities as told by your health care provider. Ask your health care provider what activities are safe for you. General instructions  Take over-the-counter and prescription medicines only as told by your health care provider.  Drink enough fluid to keep your urine pale yellow.  Keep all follow-up visits as told by your health care provider. This is important. How is this prevented?  There is no vaccine to help prevent COVID-19 infection. However, there are steps you can take to protect yourself and others from this virus. To protect yourself:   Do not travel to areas where COVID-19 is a risk. The areas where COVID-19 is reported change often. To identify high-risk areas and travel restrictions, check the CDC travel website: wwwnc.cdc.gov/travel/notices  If you live in, or must travel to, an area where COVID-19 is a risk, take precautions to avoid infection. ? Stay away from people who are sick. ? Wash your hands often with soap and water for 20 seconds. If soap and water   are not available, use an alcohol-based hand sanitizer. ? Avoid touching your mouth, face, eyes, or nose. ? Avoid going out in public, follow guidance from your state and local health authorities. ? If you must go out in public, wear a cloth face covering or face mask. Make sure your mask covers your nose and mouth. ? Avoid crowded indoor spaces. Stay at least 6 feet (2 meters) away from others. ? Disinfect objects and surfaces that are frequently touched every day. This may include:  Counters and tables.  Doorknobs and light switches.  Sinks and faucets.  Electronics, such as phones, remote controls, keyboards, computers, and tablets. To protect others: If you have symptoms of COVID-19, take steps to prevent the virus from spreading to others.  If you think you have a COVID-19 infection, contact  your health care provider right away. Tell your health care team that you think you may have a COVID-19 infection.  Stay home. Leave your house only to seek medical care. Do not use public transport.  Do not travel while you are sick.  Wash your hands often with soap and water for 20 seconds. If soap and water are not available, use alcohol-based hand sanitizer.  Stay away from other members of your household. Let healthy household members care for children and pets, if possible. If you have to care for children or pets, wash your hands often and wear a mask. If possible, stay in your own room, separate from others. Use a different bathroom.  Make sure that all people in your household wash their hands well and often.  Cough or sneeze into a tissue or your sleeve or elbow. Do not cough or sneeze into your hand or into the air.  Wear a cloth face covering or face mask. Make sure your mask covers your nose and mouth. Where to find more information  Centers for Disease Control and Prevention: www.cdc.gov/coronavirus/2019-ncov/index.html  World Health Organization: www.who.int/health-topics/coronavirus Contact a health care provider if:  You live in or have traveled to an area where COVID-19 is a risk and you have symptoms of the infection.  You have had contact with someone who has COVID-19 and you have symptoms of the infection. Get help right away if:  You have trouble breathing.  You have pain or pressure in your chest.  You have confusion.  You have bluish lips and fingernails.  You have difficulty waking from sleep.  You have symptoms that get worse. These symptoms may represent a serious problem that is an emergency. Do not wait to see if the symptoms will go away. Get medical help right away. Call your local emergency services (911 in the [place] Do not drive yourself to the hospital. Let the emergency medical personnel know if you think you have  COVID-19. Summary  COVID-19 is a respiratory infection that is caused by a virus. It is also known as coronavirus disease or novel coronavirus. It can cause serious infections, such as pneumonia, acute respiratory distress syndrome, acute respiratory failure, or sepsis.  The virus that causes COVID-19 is contagious. This means that it can spread from person to person through droplets from breathing, speaking, singing, coughing, or sneezing.  You are more likely to develop a serious illness if you are 50 years of age or older, have a weak immune system, live in a nursing home, or have chronic disease.  There is no medicine to treat COVID-19. Your health care provider will talk with you about ways to treat your symptoms.    Take steps to protect yourself and others from infection. Wash your hands often and disinfect objects and surfaces that are frequently touched every day. Stay away from people who are sick and wear a mask if you are sick. This information is not intended to replace advice given to you by your health care provider. Make sure you discuss any questions you have with your health care provider. Document Revised: 09/06/2019 Document Reviewed: 12/13/2018 Elsevier Patient Education  2020 Elsevier Inc. What types of side effects do monoclonal antibody drugs cause?  Common side effects  In general, the more common side effects caused by monoclonal antibody drugs include: . Allergic reactions, such as hives or itching . Flu-like signs and symptoms, including chills, fatigue, fever, and muscle aches and pains . Nausea, vomiting . Diarrhea . Skin rashes . Low blood pressure   The CDC is recommending patients who receive monoclonal antibody treatments wait at least 90 days before being vaccinated.  Currently, there are no data on the safety and efficacy of mRNA COVID-19 vaccines in persons who received monoclonal antibodies or convalescent plasma as part of COVID-19 treatment. Based  on the estimated half-life of such therapies as well as evidence suggesting that reinfection is uncommon in the 90 days after initial infection, vaccination should be deferred for at least 90 days, as a precautionary measure until additional information becomes available, to avoid interference of the antibody treatment with vaccine-induced immune responses. 

## 2020-09-16 ENCOUNTER — Other Ambulatory Visit: Payer: BC Managed Care – PPO

## 2020-09-16 ENCOUNTER — Ambulatory Visit (INDEPENDENT_AMBULATORY_CARE_PROVIDER_SITE_OTHER): Payer: BC Managed Care – PPO | Admitting: Nurse Practitioner

## 2020-09-16 VITALS — BP 122/84 | HR 50 | Temp 97.1°F | Ht 65.0 in | Wt 191.0 lb

## 2020-09-16 DIAGNOSIS — Z8616 Personal history of COVID-19: Secondary | ICD-10-CM | POA: Insufficient documentation

## 2020-09-16 NOTE — Assessment & Plan Note (Signed)
Stay well hydrated  Stay active  Deep breathing exercises  May start vitamin C 2,000 mg daily, vitamin D3 2,000 IU daily, Zinc 220 mg daily  May take tylenol or fever or pain  May take mucinex DM twice daily  Will order chest x ray   Follow up:  Follow up as needed

## 2020-09-16 NOTE — Patient Instructions (Addendum)
History of Covid 19:   Stay well hydrated  Stay active  Deep breathing exercises  May start vitamin C 2,000 mg daily, vitamin D3 2,000 IU daily, Zinc 220 mg daily  May take tylenol or fever or pain  May take mucinex DM twice daily  Will order chest x ray   Follow up:  Follow up as needed

## 2020-09-16 NOTE — Progress Notes (Signed)
@Patient  ID: , female    DOB: 12/30/1975, 44 y.o.   MRN: [id number]  Chief Complaint  Patient presents with  . New Patient (Initial Visit)    COVID 10/13 recieved infusion. No sx wants to make sure lungs are clear    Referring provider: 11/13, NP  43 year old female with history of migraine, hypertension, sarcoidosis, obesity.  HPI  Patient presents today for post COVID care clinic visit.  Patient was diagnosed with Covid on 09/02/2020.  She did receive monoclonal antibody infusion.  Patient was fully vaccinated.  Patient was not hospitalized.  Patient states that she is now recovering.  She denies any significant shortness of breath or cough at this time.  She is requesting a chest x-ray to make sure she does not have any pneumonia. Denies f/c/s, n/v/d, hemoptysis, PND, chest pain or edema.        Allergies  Allergen Reactions  . Fish Allergy Anaphylaxis and Shortness Of Breath  . Naproxen Anxiety, Other (See Comments) and Anaphylaxis    Vaginal bleeding and anxiety Other reaction(s): Bleeding (intolerance) Stomach bleeding Vaginal bleeding and anxiety  . Other Anaphylaxis and Rash  . Sulfa Antibiotics Anxiety, Other (See Comments) and Anaphylaxis    Vaginal bleeding and anxiety Vaginal bleeding and anxiety Vaginal bleeding and anxiety  . Valium [Diazepam] Nausea And Vomiting  . Asa [Aspirin]     Immunization History  Administered Date(s) Administered  . PFIZER SARS-COV-2 Vaccination 02/20/2020, 03/16/2020    Past Medical History:  Diagnosis Date  . Hypertension   . Iron deficiency anemia   . Migraine   . Sarcoidosis   . Vertigo     Tobacco History: Social History   Tobacco Use  Smoking Status Current Every Day Smoker  . Packs/day: 1.00  . Years: 17.00  . Pack years: 17.00  . Types: Cigarettes  Smokeless Tobacco Never Used   Ready to quit: Yes Counseling given: Yes   Outpatient Encounter Medications as of 09/16/2020   Medication Sig  . albuterol (VENTOLIN HFA) 108 (90 Base) MCG/ACT inhaler Inhale into the lungs.  09/18/2020 amLODipine (NORVASC) 5 MG tablet TAKE 1 TABLET BY MOUTH EVERY DAY  . calcium-vitamin D (RA HI-CAL PLUS VITAMIN D) 500-200 MG-UNIT tablet Take 1 tablet by mouth daily with breakfast.  . fluticasone (FLONASE) 50 MCG/ACT nasal spray Place 1 spray into both nostrils daily.  . meclizine (ANTIVERT) 25 MG tablet Take 1 tablet (25 mg total) by mouth 3 (three) times daily as needed for dizziness.  . benzonatate (TESSALON) 200 MG capsule Take 200 mg by mouth every 8 (eight) hours.   No facility-administered encounter medications on file as of 09/16/2020.     Review of Systems  Review of Systems  Constitutional: Negative.  Negative for fatigue and fever.  HENT: Negative.   Respiratory: Negative for cough and shortness of breath.   Cardiovascular: Negative.  Negative for chest pain, palpitations and leg swelling.  Gastrointestinal: Negative.   Allergic/Immunologic: Negative.   Neurological: Negative.   Psychiatric/Behavioral: Negative.        Physical Exam  BP 122/84 (BP Location: Left Arm)   Pulse (!) 50   Temp (!) 97.1 F (36.2 C)   Ht 5\' 5"  (1.651 m)   Wt 191 lb 0.1 oz (86.6 kg)   SpO2 99%   BMI 31.78 kg/m   Wt Readings from Last 5 Encounters:  09/16/20 191 lb 0.1 oz (86.6 kg)  09/07/20 194 lb (88 kg)  08/27/20 194 lb  9.6 oz (88.3 kg)  06/04/20 195 lb (88.5 kg)  05/28/20 190 lb (86.2 kg)     Physical Exam Vitals and nursing note reviewed.  Constitutional:      General: She is not in acute distress.    Appearance: She is well-developed.  Cardiovascular:     Rate and Rhythm: Normal rate and regular rhythm.  Pulmonary:     Effort: Pulmonary effort is normal.     Breath sounds: Normal breath sounds.  Musculoskeletal:     Right lower leg: No edema.     Left lower leg: No edema.  Neurological:     Mental Status: She is alert and oriented to person, place, and time.   Psychiatric:        Mood and Affect: Mood normal.        Behavior: Behavior normal.        Assessment & Plan:   History of COVID-19 Stay well hydrated  Stay active  Deep breathing exercises  May start vitamin C 2,000 mg daily, vitamin D3 2,000 IU daily, Zinc 220 mg daily  May take tylenol or fever or pain  May take mucinex DM twice daily  Will order chest x ray   Follow up:  Follow up as needed      Ivonne Andrew, NP 09/16/2020

## 2020-09-17 ENCOUNTER — Ambulatory Visit
Admission: RE | Admit: 2020-09-17 | Discharge: 2020-09-17 | Disposition: A | Discharge status: Home / Self Care | Payer: BC Managed Care – PPO | Source: Ambulatory Visit | Attending: Nurse Practitioner | Admitting: Nurse Practitioner

## 2020-09-17 DIAGNOSIS — R0602 Shortness of breath: Secondary | ICD-10-CM | POA: Diagnosis not present

## 2020-09-28 ENCOUNTER — Encounter: Payer: Self-pay | Admitting: Neurology

## 2020-10-20 ENCOUNTER — Ambulatory Visit: Payer: BC Managed Care – PPO | Admitting: Neurology

## 2020-10-23 ENCOUNTER — Encounter: Payer: Self-pay | Admitting: Nurse Practitioner

## 2020-10-23 ENCOUNTER — Telehealth (INDEPENDENT_AMBULATORY_CARE_PROVIDER_SITE_OTHER): Payer: BC Managed Care – PPO | Admitting: Nurse Practitioner

## 2020-10-23 VITALS — BP 142/85 | HR 67 | Ht 65.0 in | Wt 195.0 lb

## 2020-10-23 DIAGNOSIS — J069 Acute upper respiratory infection, unspecified: Secondary | ICD-10-CM | POA: Diagnosis not present

## 2020-10-23 MED ORDER — HYDROCODONE-HOMATROPINE 5-1.5 MG/5ML PO SYRP: 5.0000 mL | ORAL_SOLUTION | Freq: Four times a day (QID) | ORAL | 0 refills | Status: DC | PRN

## 2020-10-23 MED ORDER — CHLORPHENIRAMINE-ACETAMINOPHEN 2-325 MG PO TABS: 2.0000 | ORAL_TABLET | Freq: Four times a day (QID) | ORAL | 0 refills | Status: DC

## 2020-10-23 MED ORDER — GUAIFENESIN-CODEINE 100-10 MG/5ML PO SOLN: 5.0000 mL | Freq: Four times a day (QID) | ORAL | 0 refills | Status: DC | PRN

## 2020-10-23 NOTE — Patient Instructions (Signed)

## 2020-10-23 NOTE — Progress Notes (Signed)
Virtual Visit via Video Note  I connected with@ on 10/23/20 at  1:00 PM EST by a video enabled telemedicine application and verified that I am speaking with the correct person using two identifiers.  Location: Patient:Home Provider: Office Participants: patient and provider  I discussed the limitations of evaluation and management by telemedicine and the availability of in person appointments. I also discussed with the patient that there may be a patient responsible charge related to this service. The patient expressed understanding and agreed to proceed.  CC:Pt c/o nasal congestion, cough, head pressure, itchy throat and runny eyes x3 days. Pt tested negative for COVID today.   History of Present Illness: URI  This is a new problem. The current episode started in the past 7 days. The problem has been unchanged. There has been no fever. Associated symptoms include congestion, coughing, headaches, a plugged ear sensation, rhinorrhea and sinus pain. Pertinent negatives include no abdominal pain, chest pain, diarrhea, dysuria, joint pain, joint swelling, nausea, neck pain, rash, sneezing, sore throat, swollen glands, vomiting or wheezing. She has tried nothing for the symptoms.   Observations/Objective: Physical Exam Constitutional:      General: She is not in acute distress. Pulmonary:     Effort: Pulmonary effort is normal.  Neurological:     Mental Status: She is alert and oriented to person, place, and time.    Assessment and Plan: Baneza was seen today for acute visit.  Diagnoses and all orders for this visit:  Viral upper respiratory tract infection -     Discontinue: HYDROcodone-homatropine (HYCODAN) 5-1.5 MG/5ML syrup; Take 5 mLs by mouth every 6 (six) hours as needed. -     Chlorpheniramine-APAP 2-325 MG TABS; Take 2 tablets by mouth every 6 (six) hours. -     guaiFENesin-codeine 100-10 MG/5ML syrup; Take 5 mLs by mouth every 6 (six) hours as needed for cough.   Follow Up  Instructions: URI Instructions: Encourage adequate oral hydration. Use over-the-counter  "cold" medicines  such as "Tylenol cold" , "Advil cold",  "Mucinex" or" Mucinex D"  for cough and congestion.  Avoid decongestants if you have high blood pressure. Use" Delsym" or" Robitussin" cough syrup varietis for cough.  You can use plain "Tylenol" or "Advi"l for fever, chills and achyness.  "Common cold" symptoms are usually triggered by a virus.  The antibiotics are usually not necessary. On average, a" viral cold" illness would take 4-7 days to resolve. Please, make an appointment if you are not better or if you're worse.  I discussed the assessment and treatment plan with the patient. The patient was provided an opportunity to ask questions and all were answered. The patient agreed with the plan and demonstrated an understanding of the instructions.   The patient was advised to call back or seek an in-person evaluation if the symptoms worsen or if the condition fails to improve as anticipated.  Alysia Penna, NP

## 2020-10-27 ENCOUNTER — Encounter: Payer: Self-pay | Admitting: Neurology

## 2020-10-27 ENCOUNTER — Ambulatory Visit: Payer: BC Managed Care – PPO | Admitting: Neurology

## 2020-10-27 ENCOUNTER — Telehealth: Payer: Self-pay

## 2020-10-27 NOTE — Telephone Encounter (Signed)
Pt did not show for their appt with Dr. Athar today.  

## 2020-11-09 ENCOUNTER — Ambulatory Visit (INDEPENDENT_AMBULATORY_CARE_PROVIDER_SITE_OTHER): Payer: BC Managed Care – PPO | Admitting: Neurology

## 2020-11-09 ENCOUNTER — Encounter: Payer: Self-pay | Admitting: Neurology

## 2020-11-09 ENCOUNTER — Other Ambulatory Visit: Payer: Self-pay

## 2020-11-09 DIAGNOSIS — R42 Dizziness and giddiness: Secondary | ICD-10-CM | POA: Insufficient documentation

## 2020-11-09 DIAGNOSIS — G43109 Migraine with aura, not intractable, without status migrainosus: Secondary | ICD-10-CM | POA: Diagnosis not present

## 2020-11-09 HISTORY — DX: Migraine with aura, not intractable, without status migrainosus: G43.109

## 2020-11-09 MED ORDER — TOPIRAMATE 25 MG PO TABS: ORAL_TABLET | ORAL | 3 refills | Status: DC

## 2020-11-09 NOTE — Patient Instructions (Signed)
We will start Topamax for the headache and vertigo.  Topamax (topiramate) is a seizure medication that has an FDA approval for seizures and for migraine headache. Potential side effects of this medication include weight loss, cognitive slowing, tingling in the fingers and toes, and carbonated drinks will taste bad. If any significant side effects are noted on this drug, please contact our office.

## 2020-11-09 NOTE — Progress Notes (Signed)
Reason for visit: Vertigo, headache  Referring physician: Dr. Aggie Cosier is a 44 y.o. female  History of present illness:  [name] is a 44 year old right-handed black female with a history of episodes of headache and dizziness since her teenage years.  The patient indicates that her symptoms initially were intermittent, she would get episodes of vertigo and headache that would come and go.  The patient has been placed on Imitrex and Topamax in the past, she recalls that she gained benefit from the Topamax.  She does not recall exactly why she stopped the drug.  Beginning about 4 years ago, the patient converted to having virtually daily vertigo associated with some nausea and occasional vomiting.  The patient also has a lot of anxiety with these events.  She is still getting some headaches but headaches are not daily in nature, they may occur once a month or so.  The patient was seen by Dr. Lazarus Salines and underwent MRI of the brain and January 2021 that was unremarkable.  The patient has undergone vestibular therapy without much benefit.  The patient has not been able to work over the last 4 years because of the vertigo, she had worked as a Naval architect.  In the past, she was given a trial on diazepam but had significant nausea and vomiting on the medication.  The patient does report some neck discomfort, but this is not severe.  She does have some tinnitus and muffled hearing at times.  She also reports episodes of syncope that occurred off and on since her teenage years, she underwent a cardiac monitor study and a 2D echocardiogram that were unrevealing.  The patient may have some difficulty with gait instability, no visual loss with the vertigo.  She denies any weakness of the extremities, she may have some occasional numbness into the left hand and left thigh.  She comes to the office today for further evaluation.  Past Medical History:  Diagnosis Date  . Hypertension   . Iron  deficiency anemia   . Migraine   . Sarcoidosis   . Vertigo     Past Surgical History:  Procedure Laterality Date  . CESAREAN SECTION    . CESAREAN SECTION    . KNEE SURGERY    . TUBAL LIGATION      Family History  Problem Relation Age of Onset  . Lupus Mother   . Healthy Father   . Heart failure Maternal Grandmother   . Diabetes Maternal Grandmother   . Hypertension Maternal Grandmother   . Thyroid disease Maternal Grandmother   . Diabetes Maternal Grandfather   . Diabetes Paternal Grandmother   . Thyroid disease Sister   . Anemia Sister     Social history:  reports that she has been smoking cigarettes. She has a 17.00 pack-year smoking history. She has never used smokeless tobacco. She reports current alcohol use. She reports that she does not use drugs.  Medications:  Prior to Admission medications   Medication Sig Start Date End Date Taking? Authorizing Provider  albuterol (VENTOLIN HFA) 108 (90 Base) MCG/ACT inhaler Inhale into the lungs. 07/07/20  Yes [provider]  amLODipine (NORVASC) 5 MG tablet TAKE 1 TABLET BY MOUTH EVERY DAY 08/12/20  Yes Olive Bass, FNP  calcium-vitamin D (RA HI-CAL PLUS VITAMIN D) 500-200 MG-UNIT tablet Take 1 tablet by mouth daily with breakfast. 08/27/20  Yes O'Neal, Ronnald Ramp, MD  Chlorpheniramine-APAP 2-325 MG TABS Take 2 tablets by mouth  every 6 (six) hours. 10/23/20  Yes Nche, Bonna Gains, NP  fluticasone (FLONASE) 50 MCG/ACT nasal spray Place 1 spray into both nostrils daily. 09/07/20  Yes Deeann Saint, MD  guaiFENesin-codeine 100-10 MG/5ML syrup Take 5 mLs by mouth every 6 (six) hours as needed for cough. 10/23/20  Yes Nche, Bonna Gains, NP  meclizine (ANTIVERT) 25 MG tablet Take 1 tablet (25 mg total) by mouth 3 (three) times daily as needed for dizziness. 08/27/20  Yes O'Neal, Ronnald Ramp, MD      Allergies  Allergen Reactions  . Fish Allergy Anaphylaxis and Shortness Of Breath  . Naproxen Anxiety,  Other (See Comments) and Anaphylaxis    Vaginal bleeding and anxiety Other reaction(s): Bleeding (intolerance) Stomach bleeding Vaginal bleeding and anxiety  . Other Anaphylaxis and Rash  . Sulfa Antibiotics Anxiety, Other (See Comments) and Anaphylaxis    Vaginal bleeding and anxiety Vaginal bleeding and anxiety Vaginal bleeding and anxiety  . Valium [Diazepam] Nausea And Vomiting  . Asa [Aspirin]     ROS:  Out of a complete 14 system review of symptoms, the patient complains only of the following symptoms, and all other reviewed systems are negative.  Vertigo Headache Gait instability Nausea  Blood pressure 125/87, pulse 62, height 5\' 5"  (1.651 m), weight 196 lb 9.6 oz (89.2 kg).  Physical Exam  General: The patient is alert and cooperative at the time of the examination.  Eyes: Pupils are equal, round, and reactive to light. Discs are flat bilaterally.  Neck: The neck is supple, no carotid bruits are noted.  Respiratory: The respiratory examination is clear.  Cardiovascular: The cardiovascular examination reveals a regular rate and rhythm, no obvious murmurs or rubs are noted.  Skin: Extremities are without significant edema.  Neurologic Exam  Mental status: The patient is alert and oriented x 3 at the time of the examination. The patient has apparent normal recent and remote memory, with an apparently normal attention span and concentration ability.  Cranial nerves: Facial symmetry is present. There is good sensation of the face to pinprick and soft touch bilaterally. The strength of the facial muscles and the muscles to head turning and shoulder shrug are normal bilaterally. Speech is well enunciated, no aphasia or dysarthria is noted. Extraocular movements are full. Visual fields are full. The tongue is midline, and the patient has symmetric elevation of the soft palate. No obvious hearing deficits are noted.  Motor: The motor testing reveals 5 over 5 strength of  all 4 extremities. Good symmetric motor tone is noted throughout.  Sensory: Sensory testing is intact to pinprick, soft touch, vibration sensation, and position sense on all 4 extremities. No evidence of extinction is noted.  Coordination: Cerebellar testing reveals good finger-nose-finger and heel-to-shin bilaterally.  Gait and station: Gait is normal. Tandem gait is minimally unsteady. Romberg is negative. No drift is seen.  Reflexes: Deep tendon reflexes are symmetric and normal bilaterally. Toes are downgoing bilaterally.   MRI brain 12/04/19:  IMPRESSION: Normal examination. No abnormality seen to explain the presenting Symptoms.  * MRI scan images were reviewed online. I agree with the written report.    Assessment/Plan:  1.  History of migraine headache, vertigo  2.  Chronic daily vertigo  3.  Episodic syncope  The patient in the past has had a history of headache associated with vertigo that is typical for migraine.  The episodes initially were intermittent since her teenage years, but over the last 4 years she has had daily vertigo.  She is on meclizine on a regular basis without significant benefit, the meclizine causes a lot of daytime drowsiness.  We will initiate treatment with Topamax at this time as she seemed to have a positive response to this previously.  She will follow up here in 4 months, she will call our office for any dose adjustments.  Marlan Palau MD 11/09/2020 3:42 PM  [facility] [address] [place], [place] [address]  Phone [phone] Fax [phone]

## 2020-11-24 ENCOUNTER — Ambulatory Visit: Payer: BC Managed Care – PPO | Admitting: Neurology

## 2020-12-02 DIAGNOSIS — Z1152 Encounter for screening for COVID-19: Secondary | ICD-10-CM | POA: Diagnosis not present

## 2021-02-23 IMAGING — DX DG CHEST 1V PORT
1 series · 1 of 1 positions shown · non-contrast
Comparison: Chest radiograph 11/20/2017

CLINICAL DATA: Chest pain. Additional history provided: Dizziness,
vertigo, tightness left upper extremity for 2 days.

EXAM:
PORTABLE CHEST 1 VIEW

[chest ap]
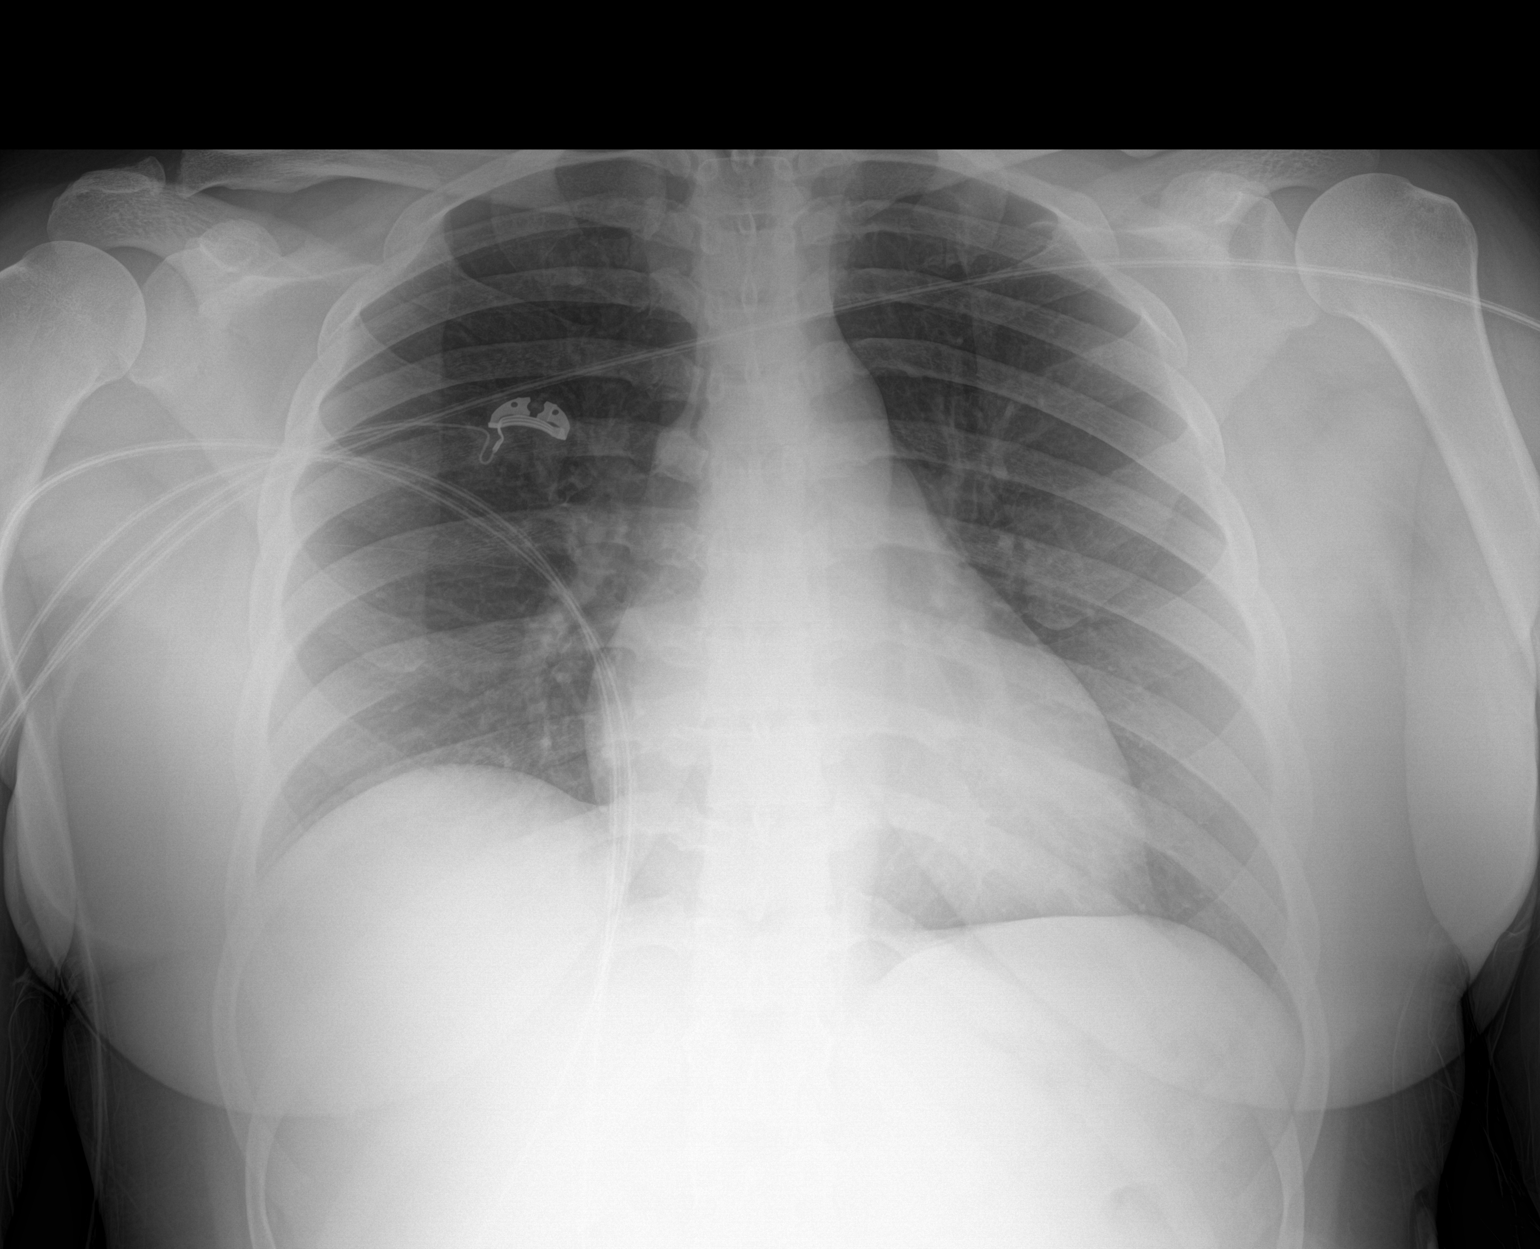

[1 of 1 positions shown; findings below may reference images not displayed]

FINDINGS: Heart size within normal limits.

There is no airspace consolidation within the lungs.

No evidence of pleural effusion or pneumothorax.

No acute bony abnormality.

Overlying cardiac monitoring leads.
IMPRESSION: No evidence of acute cardiopulmonary abnormality.

## 2021-03-01 ENCOUNTER — Other Ambulatory Visit: Payer: Self-pay

## 2021-03-02 ENCOUNTER — Ambulatory Visit (INDEPENDENT_AMBULATORY_CARE_PROVIDER_SITE_OTHER): Payer: BC Managed Care – PPO | Admitting: Family

## 2021-03-02 ENCOUNTER — Encounter: Payer: Self-pay | Admitting: Family

## 2021-03-02 ENCOUNTER — Other Ambulatory Visit: Payer: Self-pay

## 2021-03-02 VITALS — BP 122/70 | HR 69 | Temp 98.4°F | Ht 65.0 in | Wt 189.4 lb

## 2021-03-02 DIAGNOSIS — I10 Essential (primary) hypertension: Secondary | ICD-10-CM | POA: Diagnosis not present

## 2021-03-02 DIAGNOSIS — D649 Anemia, unspecified: Secondary | ICD-10-CM

## 2021-03-02 LAB — CBC WITH DIFFERENTIAL/PLATELET
Basophils Absolute: 0 10*3/uL (ref 0.0–0.1)
Basophils Relative: 0.7 % (ref 0.0–3.0)
Eosinophils Absolute: 0 10*3/uL (ref 0.0–0.7)
Eosinophils Relative: 1.3 % (ref 0.0–5.0)
HCT: 37.6 % (ref 36.0–46.0)
Hemoglobin: 12.4 g/dL (ref 12.0–15.0)
Lymphocytes Relative: 43.4 % (ref 12.0–46.0)
Lymphs Abs: 1.7 10*3/uL (ref 0.7–4.0)
MCHC: 32.9 g/dL (ref 30.0–36.0)
MCV: 86.9 fl (ref 78.0–100.0)
Monocytes Absolute: 0.4 10*3/uL (ref 0.1–1.0)
Monocytes Relative: 11.2 % (ref 3.0–12.0)
Neutro Abs: 1.7 10*3/uL (ref 1.4–7.7)
Neutrophils Relative %: 43.4 % (ref 43.0–77.0)
Platelets: 287 10*3/uL (ref 150.0–400.0)
RBC: 4.33 Mil/uL (ref 3.87–5.11)
RDW: 12.8 % (ref 11.5–15.5)
WBC: 3.9 10*3/uL — ABNORMAL LOW (ref 4.0–10.5)

## 2021-03-02 LAB — COMPREHENSIVE METABOLIC PANEL
ALT: 10 U/L (ref 0–35)
AST: 11 U/L (ref 0–37)
Albumin: 3.8 g/dL (ref 3.5–5.2)
Alkaline Phosphatase: 95 U/L (ref 39–117)
BUN: 8 mg/dL (ref 6–23)
CO2: 28 mEq/L (ref 19–32)
Calcium: 9.5 mg/dL (ref 8.4–10.5)
Chloride: 105 mEq/L (ref 96–112)
Creatinine, Ser: 0.69 mg/dL (ref 0.40–1.20)
GFR: 104.86 mL/min (ref >60.00)
Glucose, Bld: 73 mg/dL (ref 70–99)
Potassium: 3.6 mEq/L (ref 3.5–5.1)
Sodium: 140 mEq/L (ref 135–145)
Total Bilirubin: 0.3 mg/dL (ref 0.2–1.2)
Total Protein: 8.3 g/dL (ref 6.0–8.3)

## 2021-03-02 LAB — FERRITIN: Ferritin: 84.9 ng/mL (ref 10.0–291.0)

## 2021-03-02 MED ORDER — AMLODIPINE BESYLATE 5 MG PO TABS: 1.0000 | ORAL_TABLET | Freq: Every day | ORAL | 0 refills | Status: DC

## 2021-03-02 NOTE — Progress Notes (Signed)
[name] is a 45 y.o. female with the following history as recorded in Epic[employer]:  Patient Active Problem List   Diagnosis Date Noted  . Vertigo 11/09/2020  . Migraine headache with aura 11/09/2020  . History of COVID-19 09/16/2020  . Degenerative arthritis of knee, bilateral 10/07/2019  . Suspected COVID-19 virus infection 05/07/2019  . Chronic pain of left ankle 04/13/2018  . Sinusitis 11/25/2016  . Vitamin D deficiency 07/28/2016  . Routine general medical examination at a health care facility 04/25/2016  . Obesity 04/25/2016  . Tobacco abuse 04/25/2016  . Community acquired pneumonia 09/28/2014  . Anemia 09/28/2014  . Menorrhagia 09/28/2014  . Thrombocytopenia ([facility]) 09/28/2014  . CAP (community acquired pneumonia) 09/28/2014    Current Outpatient Medications  Medication Sig Dispense Refill  . albuterol (VENTOLIN HFA) 108 (90 Base) MCG/ACT inhaler Inhale into the lungs.    . calcium-vitamin D (RA HI-CAL PLUS VITAMIN D) 500-200 MG-UNIT tablet Take 1 tablet by mouth daily with breakfast. 90 tablet 1  . topiramate (TOPAMAX) 25 MG tablet Take one tablet at night for one week, then take 2 tablets at night for one week, then take 3 tablets at night. 90 tablet 3  . amLODipine (NORVASC) 5 MG tablet Take 1 tablet (5 mg total) by mouth daily. 90 tablet 0   No current facility-administered medications for this visit.    Allergies: Fish allergy, Naproxen, Other, Sulfa antibiotics, Valium [diazepam], and Asa [aspirin]  Past Medical History:  Diagnosis Date  . Hypertension   . Iron deficiency anemia   . Migraine   . Migraine headache with aura 11/09/2020  . Sarcoidosis   . Vertigo     Past Surgical History:  Procedure Laterality Date  . CESAREAN SECTION    . CESAREAN SECTION    . KNEE SURGERY    . TUBAL LIGATION      Family History  Problem Relation Age of Onset  . Lupus Mother   . Healthy Father   . Heart failure Maternal Grandmother   . Diabetes Maternal Grandmother    . Hypertension Maternal Grandmother   . Thyroid disease Maternal Grandmother   . Diabetes Maternal Grandfather   . Diabetes Paternal Grandmother   . Thyroid disease Sister   . Anemia Sister     Social History   Tobacco Use  . Smoking status: Current Every Day Smoker    Packs/day: 1.00    Years: 17.00    Pack years: 17.00    Types: Cigarettes  . Smokeless tobacco: Never Used  Substance Use Topics  . 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  Head: Normocephalic and atraumatic  Lungs: Respirations unlabored; clear to auscultation bilaterally without wheeze, rales, rhonchi  CVS exam: normal rate and regular rhythm.  Neurologic: Alert and oriented; speech intact; face symmetrical; moves all extremities well; CNII-XII intact without focal deficit    Assessment:  1. Primary hypertension   2. Anemia, unspecified type     Plan:  1. Refill updated x 3 months; stressed that she needs to get established with PCP for regular follow-up/ routine care; she is aware that I will be leaving this office; 2. Not having regular periods; check labs today; follow-up to be determined;  She is encouraged  to schedule with her GYN and get her mammogram done; she is encouraged to quit smoking;    This visit occurred during the SARS-CoV-2 public health emergency.  Safety protocols were in place, including screening questions prior to the visit, additional usage of staff PPE, and extensive cleaning of exam room while observing appropriate contact time as indicated for disinfecting solutions.     No follow-ups on file.  Orders Placed This Encounter  Procedures  . CBC with Differential/Platelet    Standing Status:   Future    Standing Expiration Date:   03/02/2022  . Ferritin    Standing Status:   Future    Standing Expiration Date:   03/02/2022  . Comp Met (CMET)    Standing Status:   Future    Standing Expiration Date:   03/02/2022  . Iron and TIBC    Standing Status:   Future    Standing Expiration Date:   03/02/2022    Requested Prescriptions   Signed Prescriptions Disp Refills  . amLODipine (NORVASC) 5 MG tablet 90 tablet 0    Sig: Take 1 tablet (5 mg total) by mouth daily.

## 2021-03-02 NOTE — Addendum Note (Signed)
Addended by: Waldemar Dickens B on: 03/02/2021 09:13 AM   Modules accepted: Orders

## 2021-03-03 LAB — IRON AND TIBC
Iron Saturation: 28 % (ref 15–55)
Iron: 79 ug/dL (ref 27–159)
Total Iron Binding Capacity: 282 ug/dL (ref 250–450)
UIBC: 203 ug/dL (ref 131–425)

## 2021-03-10 ENCOUNTER — Telehealth: Payer: Self-pay | Admitting: Nurse Practitioner

## 2021-03-10 ENCOUNTER — Emergency Department (HOSPITAL_COMMUNITY)
Admission: EM | Admit: 2021-03-10 | Discharge: 2021-03-10 | Disposition: A | Discharge status: Home / Self Care | Payer: BC Managed Care – PPO | Attending: Emergency Medicine | Admitting: Emergency Medicine

## 2021-03-10 ENCOUNTER — Encounter (HOSPITAL_COMMUNITY): Payer: Self-pay | Admitting: Emergency Medicine

## 2021-03-10 ENCOUNTER — Other Ambulatory Visit: Payer: Self-pay

## 2021-03-10 DIAGNOSIS — R32 Unspecified urinary incontinence: Secondary | ICD-10-CM

## 2021-03-10 DIAGNOSIS — R079 Chest pain, unspecified: Secondary | ICD-10-CM | POA: Diagnosis not present

## 2021-03-10 DIAGNOSIS — F17210 Nicotine dependence, cigarettes, uncomplicated: Secondary | ICD-10-CM | POA: Insufficient documentation

## 2021-03-10 DIAGNOSIS — R0789 Other chest pain: Secondary | ICD-10-CM | POA: Diagnosis not present

## 2021-03-10 DIAGNOSIS — R519 Headache, unspecified: Secondary | ICD-10-CM | POA: Diagnosis not present

## 2021-03-10 DIAGNOSIS — Z8616 Personal history of COVID-19: Secondary | ICD-10-CM | POA: Diagnosis not present

## 2021-03-10 DIAGNOSIS — I10 Essential (primary) hypertension: Secondary | ICD-10-CM | POA: Insufficient documentation

## 2021-03-10 DIAGNOSIS — Z79899 Other long term (current) drug therapy: Secondary | ICD-10-CM | POA: Diagnosis not present

## 2021-03-10 DIAGNOSIS — R569 Unspecified convulsions: Secondary | ICD-10-CM | POA: Diagnosis not present

## 2021-03-10 LAB — URINALYSIS, ROUTINE W REFLEX MICROSCOPIC
Bilirubin Urine: NEGATIVE
Glucose, UA: NEGATIVE mg/dL
Hgb urine dipstick: NEGATIVE
Ketones, ur: NEGATIVE mg/dL
Leukocytes,Ua: NEGATIVE
Nitrite: NEGATIVE
Protein, ur: NEGATIVE mg/dL
Specific Gravity, Urine: 1.003 — ABNORMAL LOW (ref 1.005–1.030)
pH: 7 (ref 5.0–8.0)

## 2021-03-10 LAB — PREGNANCY, URINE: Preg Test, Ur: NEGATIVE

## 2021-03-10 MED ORDER — ACETAMINOPHEN 325 MG PO TABS
650.0000 mg | ORAL_TABLET | Freq: Once | ORAL | Status: AC
  Administered 2021-03-10: 650 mg via ORAL
  Filled 2021-03-10: qty 2

## 2021-03-10 NOTE — Telephone Encounter (Signed)
I have called pt back and she stated that she has not been well since starting the Amlodipine a week ago. She has been having Jaw pain and seizure like auras. She thinks that there maybe a sulfa component in the medication and that maybe a reason why it is not agreeing with her. I have instructed her to stop the medication so until we give her a call back. She stated understanding and would like something else.   Please advise.

## 2021-03-10 NOTE — ED Triage Notes (Addendum)
BIBA Per EMS:  Pt woke up with urinary incontinence this morning. Pt states this has never happened before.  No Hx seizure, denies pregnancy, denies abd pain  Vitals WDL  140/90  65 HR  16 RR 18% RA

## 2021-03-10 NOTE — Telephone Encounter (Signed)
[facility] FYI:  ---Caller states she is experiencing chest pain with a headache. Symptoms started 2 weeks ago and has been off and on since then. No difficulty breathing. Pain level is 3-4. Also has some blurred vision and Dizziness.  Advised to call EMS 911, patient understood

## 2021-03-10 NOTE — Discharge Instructions (Signed)
Call your primary care doctor or specialist as discussed in the next 5-6 days.   Return immediately back to the ER if:  Your symptoms worsen within the next 12-24 hours. You develop new symptoms such as new fevers, persistent vomiting, new pain, shortness of breath, or new weakness or numbness, or if you have any other concerns.  

## 2021-03-10 NOTE — Telephone Encounter (Signed)
   Patient would like to know if amLODipine (NORVASC) 5 MG tablet could be causing her to feel bad. She states the issue may be related to sulfa allergy.   Please advise

## 2021-03-10 NOTE — ED Provider Notes (Signed)
[facility]-EMERGENCY DEPT Provider Note   CSN: [id number] Arrival date & time: 03/10/21  1112     History No chief complaint on file.   [name] is a 45 y.o. female.  Patient presents due to concern for episode of loss of urine control last night.  She states she woke up and noticed that she had urinated on herself.  She states this is never happened before.  She denies any fever vomiting cough or diarrhea.  Denies any back pain or new numbness or weakness.  She states she has a headache described as mild.  Gradual onset per patient.        Past Medical History:  Diagnosis Date  . Hypertension   . Iron deficiency anemia   . Migraine   . Migraine headache with aura 11/09/2020  . Sarcoidosis   . Vertigo     Patient Active Problem List   Diagnosis Date Noted  . Vertigo 11/09/2020  . Migraine headache with aura 11/09/2020  . History of COVID-19 09/16/2020  . Degenerative arthritis of knee, bilateral 10/07/2019  . Suspected COVID-19 virus infection 05/07/2019  . Chronic pain of left ankle 04/13/2018  . Sinusitis 11/25/2016  . Vitamin D deficiency 07/28/2016  . Routine general medical examination at a health care facility 04/25/2016  . Obesity 04/25/2016  . Tobacco abuse 04/25/2016  . Community acquired pneumonia 09/28/2014  . Anemia 09/28/2014  . Menorrhagia 09/28/2014  . Thrombocytopenia ([facility]) 09/28/2014  . CAP (community acquired pneumonia) 09/28/2014    Past Surgical History:  Procedure Laterality Date  . CESAREAN SECTION    . CESAREAN SECTION    . KNEE SURGERY    . TUBAL LIGATION       OB History   No obstetric history on file.     Family History  Problem Relation Age of Onset  . Lupus Mother   . Healthy Father   . Heart failure Maternal Grandmother   . Diabetes Maternal Grandmother   . Hypertension Maternal Grandmother   . Thyroid disease Maternal Grandmother   . Diabetes Maternal Grandfather   . Diabetes Paternal  Grandmother   . Thyroid disease Sister   . Anemia Sister     Social History   Tobacco Use  . Smoking status: Current Every Day Smoker    Packs/day: 1.00    Years: 17.00    Pack years: 17.00    Types: Cigarettes  . Smokeless tobacco: Never Used  Vaping Use  . Vaping Use: Never used  Substance Use Topics  . Alcohol use: Yes    Comment: occ  . Drug use: No    Home Medications Prior to Admission medications   Medication Sig Start Date End Date Taking? Authorizing Provider  albuterol (VENTOLIN HFA) 108 (90 Base) MCG/ACT inhaler Inhale into the lungs. 07/07/20   [provider]  amLODipine (NORVASC) 5 MG tablet Take 1 tablet (5 mg total) by mouth daily. 03/02/21   Olive Bass, FNP  calcium-vitamin D (RA HI-CAL PLUS VITAMIN D) 500-200 MG-UNIT tablet Take 1 tablet by mouth daily with breakfast. 08/27/20   O'Neal, Ronnald Ramp, MD  topiramate (TOPAMAX) 25 MG tablet Take one tablet at night for one week, then take 2 tablets at night for one week, then take 3 tablets at night. 11/09/20   York Spaniel, MD    Allergies    Fish allergy, Naproxen, Other, Sulfa antibiotics, Valium [diazepam], and Asa [aspirin]  Review of Systems   Review of  Systems  Constitutional: Negative for fever.  HENT: Negative for ear pain.   Eyes: Negative for pain.  Respiratory: Negative for cough.   Cardiovascular: Negative for chest pain.  Gastrointestinal: Negative for abdominal pain.  Genitourinary: Negative for flank pain.  Musculoskeletal: Negative for back pain.  Skin: Negative for rash.  Neurological: Negative for headaches.    Physical Exam Updated Vital Signs BP 136/83   Pulse 62   Temp 98.2 F (36.8 C)   Resp 16   SpO2 100%   Physical Exam Constitutional:      General: She is not in acute distress.    Appearance: Normal appearance.  HENT:     Head: Normocephalic.     Nose: Nose normal.  Eyes:     Extraocular Movements: Extraocular movements intact.   Cardiovascular:     Rate and Rhythm: Normal rate.  Pulmonary:     Effort: Pulmonary effort is normal.  Musculoskeletal:        General: Normal range of motion.     Cervical back: Normal range of motion.  Neurological:     General: No focal deficit present.     Mental Status: She is alert and oriented to person, place, and time. Mental status is at baseline.     Cranial Nerves: No cranial nerve deficit.     Motor: No weakness.     Gait: Gait normal.     ED Results / Procedures / Treatments   Labs (all labs ordered are listed, but only abnormal results are displayed) Labs Reviewed  URINALYSIS, ROUTINE W REFLEX MICROSCOPIC - Abnormal; Notable for the following components:      Result Value   Color, Urine STRAW (*)    Specific Gravity, Urine 1.003 (*)    All other components within normal limits  URINE CULTURE  PREGNANCY, URINE    EKG None  Radiology No results found.  Procedures Procedures   Medications Ordered in ED Medications  acetaminophen (TYLENOL) tablet 650 mg (has no administration in time range)    ED Course  I have reviewed the triage vital signs and the nursing notes.  Pertinent labs & imaging results that were available during my care of the patient were reviewed by me and considered in my medical decision making (see chart for details).    MDM Rules/Calculators/A&P                          Urinalysis negative for evidence of infection.  Patient otherwise has no other symptoms other than her headache.  Given Tylenol here in the ER.  On exam she has no focal neurodeficit has a normal gait without assistance.  Advised her to follow-up with her doctor within the week.  Advised ED return for new numbness weakness or any additional concerns.  Final Clinical Impression(s) / ED Diagnoses Final diagnoses:  Urinary incontinence, unspecified type    Rx / DC Orders ED Discharge Orders    None       Cheryll Cockayne, MD 03/10/21 1431

## 2021-03-10 NOTE — Telephone Encounter (Signed)
   Patient calling to report severe headache, chest pain.  Patient states overnight she had a moment of incontinence.  Call transferred to [facility]

## 2021-03-11 ENCOUNTER — Telehealth: Payer: Self-pay | Admitting: Neurology

## 2021-03-11 NOTE — Telephone Encounter (Signed)
Called pt and relayed the message from the provider. Pt stated that she wanted to stay at [facility] and Vernona Rieger is not her PCP.   She is to follow up at [facility].

## 2021-03-11 NOTE — Telephone Encounter (Signed)
Patient called in requesting a sooner appointment. Advised her that we have her scheduled for next week and that there is no openings available before then at this time with Dr. Anne Hahn. Stated she was in the ED yesterday and believes she is having seizures. Advised her to go to the Emergency Department and that I would send a message to Dr. Anne Hahn letting him know and that he would not be back in the office until Monday. Patient agreed and understood.

## 2021-03-11 NOTE — Telephone Encounter (Signed)
There is no sulfa in Amlodipine; since she has been prescribed this for years and said she was taking it, that would not make any sense.  She needs to call her neurologist and get in to see them as soon as possible if she feels like she is having seizures.  Since she is not planning to be my patient and wanted to stay at [facility], I would recommend that we see if Dr. Okey Dupre would be willing to go ahead and see her on Friday; she has availability and would not be above her limits for NP/ TOC patients on Friday.

## 2021-03-12 ENCOUNTER — Ambulatory Visit (INDEPENDENT_AMBULATORY_CARE_PROVIDER_SITE_OTHER): Payer: BC Managed Care – PPO | Admitting: Internal Medicine

## 2021-03-12 ENCOUNTER — Other Ambulatory Visit: Payer: Self-pay

## 2021-03-12 ENCOUNTER — Encounter: Payer: Self-pay | Admitting: Internal Medicine

## 2021-03-12 VITALS — BP 130/78 | HR 58 | Temp 98.0°F | Resp 18 | Ht 65.0 in | Wt 186.0 lb

## 2021-03-12 DIAGNOSIS — R569 Unspecified convulsions: Secondary | ICD-10-CM | POA: Insufficient documentation

## 2021-03-12 LAB — COMPREHENSIVE METABOLIC PANEL
ALT: 15 U/L (ref 0–35)
AST: 17 U/L (ref 0–37)
Albumin: 4 g/dL (ref 3.5–5.2)
Alkaline Phosphatase: 92 U/L (ref 39–117)
BUN: 7 mg/dL (ref 6–23)
CO2: 27 mEq/L (ref 19–32)
Calcium: 9.6 mg/dL (ref 8.4–10.5)
Chloride: 104 mEq/L (ref 96–112)
Creatinine, Ser: 0.67 mg/dL (ref 0.40–1.20)
GFR: 105.59 mL/min (ref >60.00)
Glucose, Bld: 77 mg/dL (ref 70–99)
Potassium: 3.8 mEq/L (ref 3.5–5.1)
Sodium: 138 mEq/L (ref 135–145)
Total Bilirubin: 0.3 mg/dL (ref 0.2–1.2)
Total Protein: 8.3 g/dL (ref 6.0–8.3)

## 2021-03-12 LAB — VITAMIN B12: Vitamin B-12: 230 pg/mL (ref 211–911)

## 2021-03-12 LAB — CBC
HCT: 38.7 % (ref 36.0–46.0)
Hemoglobin: 12.5 g/dL (ref 12.0–15.0)
MCHC: 32.3 g/dL (ref 30.0–36.0)
MCV: 87.2 fl (ref 78.0–100.0)
Platelets: 303 10*3/uL (ref 150.0–400.0)
RBC: 4.44 Mil/uL (ref 3.87–5.11)
RDW: 12.7 % (ref 11.5–15.5)
WBC: 4.7 10*3/uL (ref 4.0–10.5)

## 2021-03-12 LAB — TSH: TSH: 0.61 u[IU]/mL (ref 0.35–4.50)

## 2021-03-12 LAB — URINE CULTURE: Culture: <10000 — AB

## 2021-03-12 LAB — VITAMIN D 25 HYDROXY (VIT D DEFICIENCY, FRACTURES): VITD: 13.84 ng/mL — ABNORMAL LOW (ref 30.00–100.00)

## 2021-03-12 MED ORDER — VENLAFAXINE HCL ER 37.5 MG PO CP24: ORAL_CAPSULE | ORAL | 1 refills | Status: DC

## 2021-03-12 NOTE — Telephone Encounter (Signed)
I called the patient.  The patient indicates that she has not been able to get up to the 75 mg dosing Topamax as it makes her heart race.  She did have an episode of urinary incontinence at night, when she went to the emergency room her urine was extremely dilute.  The patient indicates that she drinks quite a bit of water throughout the day and up into the evening.  She may need to cut back on water intake before she goes to bed at night.  She did not bite her tongue, she did not have muscle soreness, only urinary incontinence.  Will stop Topamax, switch over to Effexor for her headaches and vertigo.  I was going to use Zonegran, but the patient has a sulfa allergy.

## 2021-03-12 NOTE — Assessment & Plan Note (Signed)
Checking CT head given changes and labs to rule out metabolic etiology.

## 2021-03-12 NOTE — Progress Notes (Addendum)
   Subjective:   Patient ID: [name], female    DOB: 10-11-1976, 45 y.o.   MRN: [id number]  HPI The patient is a 45 YO female coming in for concerns about new seizure. She had an episode after taking topirimate and amlodipine together where she had facial twitching, extreme fatigue, and urinated on herself. She did seek care in the ER and they did rule out for UTI. She did not have UTI. She called her neurologist and they said her provider was out of office and gave her a visit for next week. She has stopped topirimate and amlodipine and still feeling fatigued but some better. Denies overt confusion but feeling mentally slower. She denies fevers or chills. Denies sob, nausea or vomiting. Denies recent head injury.  Review of Systems  Constitutional: Negative.   HENT: Negative.   Eyes: Negative.   Respiratory: Negative for cough, chest tightness and shortness of breath.   Cardiovascular: Negative for chest pain, palpitations and leg swelling.  Gastrointestinal: Negative for abdominal distention, abdominal pain, constipation, diarrhea, nausea and vomiting.  Musculoskeletal: Negative.   Skin: Negative.   Neurological: Positive for seizures.  Psychiatric/Behavioral: Negative.     Objective:  Physical Exam Constitutional:      Appearance: She is well-developed.  HENT:     Head: Normocephalic and atraumatic.  Cardiovascular:     Rate and Rhythm: Normal rate and regular rhythm.  Pulmonary:     Effort: Pulmonary effort is normal. No respiratory distress.     Breath sounds: Normal breath sounds. No wheezing or rales.  Abdominal:     General: Bowel sounds are normal. There is no distension.     Palpations: Abdomen is soft.     Tenderness: There is no abdominal tenderness. There is no rebound.  Musculoskeletal:     Cervical back: Normal range of motion.  Skin:    General: Skin is warm and dry.  Neurological:     Mental Status: She is alert and oriented to person, place, and time.      Coordination: Coordination normal.     Vitals:   03/12/21 1453  BP: 130/78  Pulse: (!) 58  Resp: 18  Temp: 98 F (36.7 C)  TempSrc: Oral  SpO2: 99%  Weight: 186 lb (84.4 kg)  Height: 5\' 5"  (1.651 m)    This visit occurred during the SARS-CoV-2 public health emergency.  Safety protocols were in place, including screening questions prior to the visit, additional usage of staff PPE, and extensive cleaning of exam room while observing appropriate contact time as indicated for disinfecting solutions.   Assessment & Plan:  Visit time 25 minutes in face to face communication with patient and coordination of care, additional 10 minutes spent in record review, coordination or care, ordering tests, communicating/referring to other healthcare professionals, documenting in medical records all on the same day of the visit for total time 35 minutes spent on the visit.

## 2021-03-15 DIAGNOSIS — R569 Unspecified convulsions: Secondary | ICD-10-CM | POA: Diagnosis not present

## 2021-03-16 ENCOUNTER — Ambulatory Visit: Payer: BC Managed Care – PPO | Admitting: Family

## 2021-03-16 ENCOUNTER — Other Ambulatory Visit: Payer: Self-pay | Admitting: Internal Medicine

## 2021-03-16 MED ORDER — VITAMIN D (ERGOCALCIFEROL) 1.25 MG (50000 UNIT) PO CAPS: 50000.0000 [IU] | ORAL_CAPSULE | ORAL | 0 refills | Status: DC

## 2021-03-18 ENCOUNTER — Other Ambulatory Visit: Payer: Self-pay

## 2021-03-18 ENCOUNTER — Encounter: Payer: Self-pay | Admitting: Neurology

## 2021-03-18 ENCOUNTER — Ambulatory Visit (INDEPENDENT_AMBULATORY_CARE_PROVIDER_SITE_OTHER): Payer: BC Managed Care – PPO | Admitting: Neurology

## 2021-03-18 VITALS — BP 114/77 | HR 63 | Ht 65.0 in | Wt 187.8 lb

## 2021-03-18 DIAGNOSIS — G43109 Migraine with aura, not intractable, without status migrainosus: Secondary | ICD-10-CM

## 2021-03-18 NOTE — Progress Notes (Signed)
Reason for visit: Vertigo, migraine headache  [name] is an 45 y.o. female  History of present illness:  [name] is a 45 year old right-handed black female with a history of significant issues with vertigo.  The patient is also having headaches several times a week, the headaches usually respond fairly quickly to Tylenol.  The patient has had MRI evaluation of the brain that is unremarkable.  She has undergone vestibular rehab without much benefit with her vertigo.  At times, the vertigo is more significant when she is on the left side.  The vertigo can occur at any time however.  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  She could not tolerate more than 25 mg of Topamax due to heart racing, she has been placed on Effexor but has not yet started the medication.  She returns to the office today for further evaluation.  Past Medical History:  Diagnosis Date  . Hypertension   . Iron deficiency anemia   . Migraine   . Migraine headache with aura 11/09/2020  . Sarcoidosis   . Vertigo     Past Surgical History:  Procedure Laterality Date  . CESAREAN SECTION    . CESAREAN SECTION    . KNEE SURGERY    . TUBAL LIGATION      Family History  Problem Relation Age of Onset  . Lupus Mother   . Healthy Father   . Heart failure Maternal Grandmother   . Diabetes Maternal Grandmother   . Hypertension Maternal Grandmother   . Thyroid disease Maternal Grandmother   . Diabetes Maternal Grandfather   . Diabetes Paternal Grandmother   . Thyroid disease Sister   . Anemia Sister     Social history:  reports that she has been smoking cigarettes. She has a 17.00 pack-year smoking history. She has never used smokeless tobacco. She reports current alcohol use. She reports that she does not use drugs.    Allergies  Allergen Reactions  . Fish Allergy Anaphylaxis and Shortness Of Breath  . Naproxen Anxiety,  Other (See Comments) and Anaphylaxis    Vaginal bleeding and anxiety Other reaction(s): Bleeding (intolerance) Stomach bleeding Vaginal bleeding and anxiety  . Other Anaphylaxis and Rash  . Sulfa Antibiotics Anxiety, Other (See Comments) and Anaphylaxis    Vaginal bleeding and anxiety Vaginal bleeding and anxiety Vaginal bleeding and anxiety  . Valium [Diazepam] Nausea And Vomiting  . Asa [Aspirin]   . Topamax [Topiramate]     Heart racing    Medications:  Prior to Admission medications   Medication Sig Start Date End Date Taking? Authorizing Provider  albuterol (VENTOLIN HFA) 108 (90 Base) MCG/ACT inhaler Inhale into the lungs. 07/07/20  Yes [provider]  amLODipine (NORVASC) 5 MG tablet Take 1 tablet (5 mg total) by mouth daily. 03/02/21  Yes Olive Bass, FNP  calcium-vitamin D (RA HI-CAL PLUS VITAMIN D) 500-200 MG-UNIT tablet Take 1 tablet by mouth daily with breakfast. 08/27/20  Yes O'Neal, Ronnald Ramp, MD  venlafaxine XR (EFFEXOR XR) 37.5 MG 24 hr capsule 1 tablet daily for 1 week, then take 2 daily 03/12/21  Yes York Spaniel, MD  Vitamin D, Ergocalciferol, (DRISDOL) 1.25 MG (50000 UNIT) CAPS capsule Take 1 capsule (50,000 Units total) by mouth every 7 (seven) days. 03/16/21  Yes Myrlene Broker, MD    ROS:  Out of a complete 14 system review of symptoms,  the patient complains only of the following symptoms, and all other reviewed systems are negative.  Vertigo Headache  Blood pressure 114/77, pulse 63, height 5\' 5"  (1.651 m), weight 187 lb 12.8 oz (85.2 kg).  Physical Exam  General: The patient is alert and cooperative at the time of the examination.  Skin: No significant peripheral edema is noted.   Neurologic Exam  Mental status: The patient is alert and oriented x 3 at the time of the examination. The patient has apparent normal recent and remote memory, with an apparently normal attention span and concentration ability.   Cranial  nerves: Facial symmetry is present. Speech is normal, no aphasia or dysarthria is noted. Extraocular movements are full. Visual fields are full.  Motor: The patient has good strength in all 4 extremities.  Sensory examination: Soft touch sensation is symmetric on the face, arms, and legs.  Coordination: The patient has good finger-nose-finger and heel-to-shin bilaterally. The Nyan-Barrany procedure was unremarkable, no subjective vertigo was elicited.  The patient may have had some horizontal nystagmus with looking to the left at times.  Gait and station: The patient has a normal gait. Tandem gait is normal. Romberg is negative. No drift is seen.  Reflexes: Deep tendon reflexes are symmetric.   Assessment/Plan:  1.  Intractable vertigo  2.  Migraine headache  It is an assumption that the vertigo and headache are related.  We will try to push the Effexor up.  The patient will call for any dose adjustments.  We may give a trial on propranolol if this is not effective and then consider the use of injectable medications.  The patient will follow up in 3 months.  MD 03/18/2021 3:19 PM  [facility] [address] [place], [place]  Phone [phone] Fax [phone]

## 2021-03-22 DIAGNOSIS — Z0289 Encounter for other administrative examinations: Secondary | ICD-10-CM

## 2021-04-07 ENCOUNTER — Other Ambulatory Visit: Payer: Self-pay | Admitting: Neurology

## 2021-06-01 ENCOUNTER — Encounter: Payer: BC Managed Care – PPO | Admitting: Internal Medicine

## 2021-06-07 ENCOUNTER — Encounter: Payer: BC Managed Care – PPO | Admitting: Internal Medicine

## 2021-06-09 ENCOUNTER — Other Ambulatory Visit: Payer: Self-pay

## 2021-06-09 ENCOUNTER — Ambulatory Visit (INDEPENDENT_AMBULATORY_CARE_PROVIDER_SITE_OTHER): Payer: BC Managed Care – PPO | Admitting: Neurology

## 2021-06-09 ENCOUNTER — Encounter: Payer: Self-pay | Admitting: Neurology

## 2021-06-09 VITALS — BP 126/84 | HR 78 | Ht 64.0 in | Wt 190.0 lb

## 2021-06-09 DIAGNOSIS — G43109 Migraine with aura, not intractable, without status migrainosus: Secondary | ICD-10-CM | POA: Diagnosis not present

## 2021-06-09 DIAGNOSIS — R42 Dizziness and giddiness: Secondary | ICD-10-CM

## 2021-06-09 MED ORDER — PROPRANOLOL HCL 20 MG PO TABS: 40.0000 mg | ORAL_TABLET | Freq: Two times a day (BID) | ORAL | 3 refills | Status: DC

## 2021-06-09 MED ORDER — VENLAFAXINE HCL ER 150 MG PO CP24: 150.0000 mg | ORAL_CAPSULE | Freq: Every day | ORAL | 3 refills | Status: DC

## 2021-06-09 NOTE — Patient Instructions (Signed)
We will start propranolol 20 mg twice a day for 2 weeks, then go to 2 tablets (40 mg) twice a day.  Inderal (propranolol) is a blood pressure medication that is commonly used for migraine headaches. This is a type of beta blocker. The most common side effects include low heart rate, dizziness, fatigue, and increased depression. This medication may worsen asthma. If you believe that you are having side effects on this medication, please contact our office.

## 2021-06-09 NOTE — Progress Notes (Signed)
Reason for visit: Headache, vertigo  [name] is an 45 y.o. female  History of present illness:  [name] is a 45 year old right-handed black female with a history of frequent migraine headaches and vertigo.  The patient is having least 2-3 headaches a week at this point, she is on Effexor taking 150 mg daily with minimal benefit.  She cannot tolerate Topamax previously.  She oftentimes has to lie down with the headache.  She is having a lot of anxiety issues as well.  She is not sleeping well at night.  She returns to the office today for an evaluation.  Past Medical History:  Diagnosis Date   Hypertension    Iron deficiency anemia    Migraine    Migraine headache with aura 11/09/2020   Sarcoidosis    Vertigo     Past Surgical History:  Procedure Laterality Date   CESAREAN SECTION     CESAREAN SECTION     KNEE SURGERY     TUBAL LIGATION      Family History  Problem Relation Age of Onset   Lupus Mother    Healthy Father    Heart failure Maternal Grandmother    Diabetes Maternal Grandmother    Hypertension Maternal Grandmother    Thyroid disease Maternal Grandmother    Diabetes Maternal Grandfather    Diabetes Paternal Grandmother    Thyroid disease Sister    Anemia Sister     Social history:  reports that she has been smoking cigarettes. She has a 17.00 pack-year smoking history. She has never used smokeless tobacco. She reports current alcohol use. She reports that she does not use drugs.    Allergies  Allergen Reactions   Fish Allergy Anaphylaxis and Shortness Of Breath   Naproxen Anxiety, Other (See Comments) and Anaphylaxis    Vaginal bleeding and anxiety Other reaction(s): Bleeding (intolerance) Stomach bleeding Vaginal bleeding and anxiety   Other Anaphylaxis and Rash   Sulfa Antibiotics Anxiety, Other (See Comments) and Anaphylaxis    Vaginal bleeding and anxiety Vaginal bleeding and anxiety Vaginal bleeding and anxiety   Valium [Diazepam]  Nausea And Vomiting   Asa [Aspirin]    Topamax [Topiramate]     Heart racing    Medications:  Prior to Admission medications   Medication Sig Start Date End Date Taking? Authorizing Provider  albuterol (VENTOLIN HFA) 108 (90 Base) MCG/ACT inhaler Inhale into the lungs. 07/07/20  Yes [provider]  amLODipine (NORVASC) 5 MG tablet Take 1 tablet (5 mg total) by mouth daily. 03/02/21  Yes Olive Bass, FNP  calcium-vitamin D (RA HI-CAL PLUS VITAMIN D) 500-200 MG-UNIT tablet Take 1 tablet by mouth daily with breakfast. 08/27/20  Yes O'Neal, Ronnald Ramp, MD  venlafaxine XR (EFFEXOR-XR) 37.5 MG 24 hr capsule 1 TABLET DAILY FOR 1 WEEK, THEN TAKE 2 DAILY 04/07/21  Yes York Spaniel, MD  Vitamin D, Ergocalciferol, (DRISDOL) 1.25 MG (50000 UNIT) CAPS capsule Take 1 capsule (50,000 Units total) by mouth every 7 (seven) days. 03/16/21  Yes Myrlene Broker, MD    ROS:  Out of a complete 14 system review of symptoms, the patient complains only of the following symptoms, and all other reviewed systems are negative.  Headache Anxiety Vertigo  Blood pressure 126/84, pulse 78, height 5\' 4"  (1.626 m), weight 190 lb (86.2 kg), SpO2 98 %.  Physical Exam  General: The patient is alert and cooperative at the time of the examination.  Skin: No significant peripheral  edema is noted.   Neurologic Exam  Mental status: The patient is alert and oriented x 3 at the time of the examination. The patient has apparent normal recent and remote memory, with an apparently normal attention span and concentration ability.   Cranial nerves: Facial symmetry is present. Speech is normal, no aphasia or dysarthria is noted. Extraocular movements are full. Visual fields are full.  Motor: The patient has good strength in all 4 extremities.  Sensory examination: Soft touch sensation is symmetric on the face, arms, and legs.  Coordination: The patient has good finger-nose-finger and  heel-to-shin bilaterally.  Gait and station: The patient has a normal gait. Tandem gait is normal. Romberg is negative. No drift is seen.  Reflexes: Deep tendon reflexes are symmetric.   Assessment/Plan:  1.  History of migraine headache  2.  Vertigo, likely migraine related  3.  Anxiety  The patient is having a lot of anxiety issues with the headaches, we will start propranolol therefore starting at 20 mg twice daily for 2 weeks then go to 40 mg twice daily.  She will stay on Effexor for now.  The patient will be placed on Aimovig in the future if the propranolol is not helpful.  She will follow-up in 4 months, in the future she can be seen and followed by Dr. Delena Bali.  Marlan Palau MD 06/09/2021 2:50 PM  [facility] [address] [place], [place] [address]  Phone [phone] Fax [phone]

## 2021-06-18 ENCOUNTER — Telehealth: Payer: Self-pay | Admitting: Neurology

## 2021-06-18 NOTE — Telephone Encounter (Signed)
Pt states since she has been on propranolol (INDERAL) 20 MG tablet & venlafaxine XR (EFFEXOR XR) 150 MG 24 hr capsule her chest is tightning up, vertigo is worsening, head hurts behind ears, head and top of head, Pt was asked if she needs to go to the ED she said she is trying to avoid that.  Pt was told this message would be sent to On Call Dr but that phone not give the time she would be called.  Pt was told it would be noted that if she feels bad enough to go to the ED she should and not just wait.

## 2021-06-18 NOTE — Telephone Encounter (Signed)
I talked to the patient.  She reports that she has a severe headache.  Her migraine can feel this way.  However, in addition, she feels worse with her dizziness and reports chest tightness.  She has not contacted her primary care or has not decided if she wanted to go to the emergency room.  She is encouraged to get checked out on a more urgent basis as it is difficult to ascertain whether she has side effects from the beta-blocker or not.  She has started the beta-blocker about a week ago but started on the higher of the doses, ie 20 mg 2 pills bid.  She did not realize that she was supposed to take 1 pill twice daily for the first couple of weeks.  I reiterated the instructions to her.  She is advised to reduce her propranolol from 2 pills twice daily to 1 pill twice daily for now and I encouraged her to get checked out at urgent care for a severe exacerbation of her headache and her chest discomfort.  She demonstrated understanding and agreement and will ask one of her family members to take her.

## 2021-07-01 ENCOUNTER — Other Ambulatory Visit: Payer: Self-pay | Admitting: Neurology

## 2021-07-05 ENCOUNTER — Other Ambulatory Visit: Payer: Self-pay | Admitting: Neurology

## 2021-08-17 ENCOUNTER — Telehealth: Payer: Self-pay | Admitting: Neurology

## 2021-08-17 MED ORDER — PROPRANOLOL HCL 20 MG PO TABS: 40.0000 mg | ORAL_TABLET | Freq: Two times a day (BID) | ORAL | 3 refills | Status: DC

## 2021-08-17 MED ORDER — VENLAFAXINE HCL ER 150 MG PO CP24
150.0000 mg | ORAL_CAPSULE | Freq: Every day | ORAL | 2 refills | Status: DC
Start: 2021-08-17 — End: 2021-08-23

## 2021-08-17 NOTE — Telephone Encounter (Signed)
I have refilled rx and have asked [facility] [address] to d/c other rx refills.

## 2021-08-17 NOTE — Telephone Encounter (Signed)
Pt request refills venlafaxine XR (EFFEXOR-XR) 150 MG 24 hr capsuleand propranolol (INDERAL) 20 MG tablet Change pharmacy to: [facility]  [address]  [place], [place] [address]

## 2021-08-23 ENCOUNTER — Other Ambulatory Visit: Payer: Self-pay | Admitting: Neurology

## 2021-08-23 ENCOUNTER — Telehealth: Payer: Self-pay | Admitting: Neurology

## 2021-08-23 MED ORDER — AIMOVIG 140 MG/ML ~~LOC~~ SOAJ: 140.0000 mg | SUBCUTANEOUS | 4 refills | Status: DC

## 2021-08-23 MED ORDER — VENLAFAXINE HCL ER 150 MG PO CP24
150.0000 mg | ORAL_CAPSULE | Freq: Every day | ORAL | 0 refills | Status: DC
Start: 2021-08-23 — End: 2021-08-26

## 2021-08-23 NOTE — Telephone Encounter (Signed)
Pt called states the pharmacy is not refilling the Erenumab-aooe (AIMOVIG) 140 MG/ML SOAJ due to the fact her ins is not covering it. Pt wants to know what she should do.

## 2021-08-23 NOTE — Telephone Encounter (Signed)
I called the patient.  The patient [name] that she is still having 2-3 headaches of week, she has significant vertigo with this and is unable to drive a car.  She claims that a cardiologist took her out of work 3 years ago but now she wants our office to take this over.  She is applying for so security disability, she is not on disability currently.  We need to get more aggressive with treating her headaches.  As discussed previously we will try Aimovig.  She indicates that she was unable to tolerate the propranolol.  A prescription was sent in for the Aimovig.  I indicated that she probably needs to contact the doctor who initiated the out of work letter to continue this.

## 2021-08-23 NOTE — Telephone Encounter (Signed)
(  Key: [id number]) I called pt and advised we have submitted the PA for aimovig.  Pt sts she moved two weeks ago and she misplaced her Effexor, during a move 2 weeks ago.  Pt reports pharmacy has told her it is too early to fill this rx.   I advised we have sent an additional rx to the pharmacy and it might be beneficial to do a cash price for her medication while she is waiting for the insurance to kick back in. Pt verbalized agreement to this plan and will CB if she has any issues. Pt also advised we will touch base once we receive determination on the aimovig PA.

## 2021-08-23 NOTE — Telephone Encounter (Signed)
Pt called called requesting refill for venlafaxine XR (EFFEXOR-XR) 150 MG 24 hr capsule. [phone]. Also pt is requesting a letter for her job stating she cannot work.

## 2021-08-26 MED ORDER — EMGALITY 120 MG/ML ~~LOC~~ SOSY: 120.0000 mg | PREFILLED_SYRINGE | SUBCUTANEOUS | 4 refills | Status: DC

## 2021-08-26 MED ORDER — VENLAFAXINE HCL ER 150 MG PO CP24: 150.0000 mg | ORAL_CAPSULE | Freq: Every day | ORAL | 0 refills | Status: DC

## 2021-08-26 NOTE — Telephone Encounter (Signed)
I called the patient.  The insurance company prefers either [place] or [place], we will start [employer]  I sent in a small prescription for the Effexor, she apparently lost the prescription.

## 2021-08-26 NOTE — Telephone Encounter (Signed)
Aimovig PA has been denied, pt must try [name] or Ajovy first. Will fwd to MD to see which medication would be best to try.

## 2021-08-26 NOTE — Addendum Note (Signed)
Addended by: York Spaniel on: 08/26/2021 04:09 PM   Modules accepted: Orders

## 2021-09-16 ENCOUNTER — Ambulatory Visit: Payer: BC Managed Care – PPO | Admitting: Family Medicine

## 2021-09-16 NOTE — Progress Notes (Deleted)
   I, Christoper Fabian, LAT, ATC, am serving as scribe for Dr. [name].  [name] is a 45 y.o. female who presents to [facility] today for neck/back pain.  She was last seen by Dr. Katrinka Blazing on 10/07/19 for B knee pain.  Today, pt reports   Radiating pain: Numbness/tingling:  Aggravating factors: Treatments tried:   Pertinent review of systems: ***  Relevant historical information: ***   Exam:  There were no vitals taken for this visit. General: Well Developed, well nourished, and in no acute distress.   MSK: ***    Lab and Radiology Results No results found for this or any previous visit (from the past 72 hour(s)). No results found.     Assessment and Plan: 45 y.o. female with ***   PDMP not reviewed this encounter. No orders of the defined types were placed in this encounter.  No orders of the defined types were placed in this encounter.    Discussed warning signs or symptoms. Please see discharge instructions. Patient expresses understanding.   ***

## 2021-09-23 ENCOUNTER — Other Ambulatory Visit: Payer: Self-pay | Admitting: Neurology

## 2021-09-27 ENCOUNTER — Telehealth: Payer: Self-pay | Admitting: Cardiovascular Disease

## 2021-09-27 NOTE — Telephone Encounter (Signed)
Patient c/o Palpitations:  High priority if patient c/o lightheadedness, shortness of breath, or chest pain  How long have you had palpitations/irregular HR/ Afib? Are you having the symptoms now? Fluttering, - not at this time  Are you currently experiencing lightheadedness, SOB or CP? No- some sharp pains and tightness- not at this time  Do you have a history of afib (atrial fibrillation) or irregular heart rhythm?   Have you checked your BP or HR? (document readings if available): elevated a little  Are you experiencing any other symptoms? No- Pt wanted to be seen- first available appointment is 10-26-21 with Gillian Shields- Please call to evaluate

## 2021-09-27 NOTE — Telephone Encounter (Signed)
Spoke to patient she stated she has been having palpitations off and on for the past 2 months.No chest pain.Advised to decrease caffeine and avoid decongestants.Appointment scheduled with Dr.O'Neal 11/16 at 3:30 pm.

## 2021-10-06 ENCOUNTER — Ambulatory Visit (INDEPENDENT_AMBULATORY_CARE_PROVIDER_SITE_OTHER): Payer: BC Managed Care – PPO | Admitting: Cardiovascular Disease

## 2021-10-06 ENCOUNTER — Encounter: Payer: Self-pay | Admitting: Cardiovascular Disease

## 2021-10-06 ENCOUNTER — Other Ambulatory Visit: Payer: Self-pay

## 2021-10-06 VITALS — BP 132/78 | HR 61 | Ht 65.0 in | Wt 193.8 lb

## 2021-10-06 DIAGNOSIS — F17210 Nicotine dependence, cigarettes, uncomplicated: Secondary | ICD-10-CM

## 2021-10-06 DIAGNOSIS — R002 Palpitations: Secondary | ICD-10-CM

## 2021-10-06 DIAGNOSIS — Z720 Tobacco use: Secondary | ICD-10-CM

## 2021-10-06 NOTE — Progress Notes (Signed)
Cardiology Office Note:   Date:  10/06/2021  NAME:  [name]    MRN: [id number] DOB:  1976/11/05   PCP:  Myrlene Broker, MD  Cardiologist:  Reatha Harps, MD  Electrophysiologist:  None   Referring MD: Myrlene Broker, *   Chief Complaint  Patient presents with   Palpitations        History of Present Illness:   [name] is a 45 y.o. female with a hx of migraines, HTN who presents for follow-up of palpitations. Seen in 2020 for dizziness. Normal echo and normal monitor.  She reports since her last visit she continued to have episodes of palpitations.  They occur 1-2 times per week.  They can last seconds.  She reports that mainly occur at night.  Reports rapid heartbeat sensation that is random.  She reports no identifiable trigger.  She reports it may be associated with stress.  Symptoms resolved quickly.  She denies any chest pain or significant shortness of breath.  She is still smoking cigarettes.  Up to 1 pack/day.  She is working on quitting.  Blood pressure well controlled today.  She is being treated for migraines with her neurologist.  Seems to be doing okay.  She cannot tolerate propranolol.  Overall symptoms appear to be stable.  No recent change.  Thyroid studies earlier this year were normal.  Past Medical History: Past Medical History:  Diagnosis Date   Hypertension    Iron deficiency anemia    Migraine    Migraine headache with aura 11/09/2020   Sarcoidosis    Vertigo     Past Surgical History: Past Surgical History:  Procedure Laterality Date   CESAREAN SECTION     CESAREAN SECTION     KNEE SURGERY     TUBAL LIGATION      Current Medications: Current Meds  Medication Sig   albuterol (VENTOLIN HFA) 108 (90 Base) MCG/ACT inhaler Inhale into the lungs.   amLODipine (NORVASC) 5 MG tablet Take 1 tablet (5 mg total) by mouth daily.   calcium-vitamin D (RA HI-CAL PLUS VITAMIN D) 500-200 MG-UNIT tablet Take 1 tablet by mouth daily  with breakfast.   Galcanezumab-gnlm ([name]) 120 MG/ML SOSY Inject 120 mg into the skin every 30 (thirty) days.   propranolol (INDERAL) 20 MG tablet Take 2 tablets (40 mg total) by mouth 2 (two) times daily.   venlafaxine XR (EFFEXOR-XR) 150 MG 24 hr capsule Take 1 capsule (150 mg total) by mouth daily with breakfast.   Vitamin D, Ergocalciferol, (DRISDOL) 1.25 MG (50000 UNIT) CAPS capsule Take 1 capsule (50,000 Units total) by mouth every 7 (seven) days.     Allergies:    Fish allergy, Naproxen, Other, Sulfa antibiotics, Valium [diazepam], Asa [aspirin], and Topamax [topiramate]   Social History: Social History   Socioeconomic History   Marital status: Legally Separated    Spouse name: Not on file   Number of children: 3   Years of education: 14   Highest education level: Not on file  Occupational History   Occupation: unemployed 11/09/20   Occupation: disabled  Tobacco Use   Smoking status: Every Day    Packs/day: 1.00    Years: 17.00    Pack years: 17.00    Types: Cigarettes   Smokeless tobacco: Never  Vaping Use   Vaping Use: Never used  Substance and Sexual Activity   Alcohol use: Never    Comment: occ   Drug use: No   Sexual  activity: Not on file  Other Topics Concern   Not on file  Social History Narrative   Lives with son   Right Handed   Drinks 6-8 cups caffeine daily   Social Determinants of Health   Financial Resource Strain: Not on file  Food Insecurity: Not on file  Transportation Needs: Not on file  Physical Activity: Not on file  Stress: Not on file  Social Connections: Not on file     Family History: The patient's family history includes Anemia in her sister; Diabetes in her maternal grandfather, maternal grandmother, and paternal grandmother; Healthy in her father; Heart failure in her maternal grandmother; Hypertension in her maternal grandmother; Lupus in her mother; Thyroid disease in her maternal grandmother and sister.  ROS:   All other  ROS reviewed and negative. Pertinent positives noted in the HPI.     EKGs/Labs/Other Studies Reviewed:   The following studies were personally reviewed by me today:  EKG:  EKG is ordered today.  The ekg ordered today demonstrates normal sinus rhythm heart rate 61, no acute ischemic changes or evidence of infarction, and was personally reviewed by me.  Zio 09/17/2019 1. Brief supraventricular tachycardia not associated with patient triggered event.  2. No sustained arrhythmias.  3. Rare Ectopy.  4. No atrial fibrillation.    TTE 09/11/2019  1. Left ventricular ejection fraction, by visual estimation, is 55 to  60%. The left ventricle has normal function. Normal left ventricular size.  There is no left ventricular hypertrophy. Normal wall motion. Normal  diastolic function.   2. Global right ventricle has normal systolic function.The right  ventricular size is normal. No increase in right ventricular wall  thickness.   3. Left atrial size was normal.   4. Right atrial size was normal.   5. The mitral valve is normal in structure. Trace mitral valve  regurgitation. No evidence of mitral stenosis.   6. The tricuspid valve is normal in structure. Tricuspid valve  regurgitation is trivial.   7. The aortic valve is normal in structure. Aortic valve regurgitation  was not visualized by color flow Doppler. Structurally normal aortic  valve, with no evidence of sclerosis or stenosis.   8. The tricuspid regurgitant velocity is 2.16 m/s, and with an assumed  right atrial pressure of 3 mmHg, the estimated right ventricular systolic  pressure is normal at 21.7 mmHg.   9. The inferior vena cava is normal in size with greater than 50%  respiratory variability, suggesting right atrial pressure of 3 mmHg.   Recent Labs: 03/12/2021: ALT 15; BUN 7; Creatinine, Ser 0.67; Hemoglobin 12.5; Platelets 303.0; Potassium 3.8; Sodium 138; TSH 0.61   Recent Lipid Panel    Component Value Date/Time   CHOL  143 07/28/2016 1119   TRIG 45.0 07/28/2016 1119   HDL 63.60 07/28/2016 1119   CHOLHDL 2 07/28/2016 1119   VLDL 9.0 07/28/2016 1119   LDLCALC 71 07/28/2016 1119    Physical Exam:   VS:  BP 132/78   Pulse 61   Ht 5\' 5"  (1.651 m)   Wt 193 lb 12.8 oz (87.9 kg)   SpO2 98%   BMI 32.25 kg/m    Wt Readings from Last 3 Encounters:  10/06/21 193 lb 12.8 oz (87.9 kg)  06/09/21 190 lb (86.2 kg)  03/18/21 187 lb 12.8 oz (85.2 kg)    General: Well nourished, well developed, in no acute distress Head: Atraumatic, normal size  Eyes: PEERLA, EOMI  Neck: Supple, no JVD Endocrine:  No thryomegaly Cardiac: Normal S1, S2; RRR; no murmurs, rubs, or gallops Lungs: Clear to auscultation bilaterally, no wheezing, rhonchi or rales  Abd: Soft, nontender, no hepatomegaly  Ext: No edema, pulses 2+ Musculoskeletal: No deformities, BUE and BLE strength normal and equal Skin: Warm and dry, no rashes   Neuro: Alert and oriented to person, place, time, and situation, CNII-XII grossly intact, no focal deficits  Psych: Normal mood and affect   ASSESSMENT:   [name] is a 45 y.o. female who presents for the following: 1. Palpitations   2. Tobacco abuse     PLAN:   1. Palpitations -Still reports symptoms of palpitations.  Occur 1-2 times per week.  Symptoms last seconds.  No chest pain or shortness of breath.  EKG is normal.  Cardiovascular examination is normal.  She underwent work-up in 2020 that showed normal echocardiogram as well as normal heart monitor.  Really nothing is changed.  I suspect this is all stress related.  Would recommend a conservative approach for now.  Symptoms or not worsening and stable.  Would recommend just to monitor them for now.  2. Tobacco abuse -4 minutes of smoking cessation counseling was provided in office today.  Disposition: Return if symptoms worsen or fail to improve.  Medication Adjustments/Labs and Tests Ordered: Current medicines are reviewed at length  with the patient today.  Concerns regarding medicines are outlined above.  Orders Placed This Encounter  Procedures   EKG 12-Lead   No orders of the defined types were placed in this encounter.   Patient Instructions  Medication Instructions:  The current medical regimen is effective;  continue present plan and medications.  *If you need a refill on your cardiac medications before your next appointment, please call your pharmacy*  Follow-Up: At [facility], you and your health needs are our priority.  As part of our continuing mission to provide you with exceptional heart care, we have created designated Provider Care Teams.  These Care Teams include your primary Cardiologist (physician) and Advanced Practice Providers (APPs -  Physician Assistants and Nurse Practitioners) who all work together to provide you with the care you need, when you need it.  We recommend signing up for the patient portal called "MyChart".  Sign up information is provided on this After Visit Summary.  MyChart is used to connect with patients for Virtual Visits (Telemedicine).  Patients are able to view lab/test results, encounter notes, upcoming appointments, etc.  Non-urgent messages can be sent to your provider as well.   To learn more about what you can do with MyChart, go to [email or web address].    Your next appointment:   As needed  The format for your next appointment:   In Person  Provider:   Reatha Harps, MD       Time Spent with Patient: I have spent a total of 25 minutes with patient reviewing hospital notes, telemetry, EKGs, labs and examining the patient as well as establishing an assessment and plan that was discussed with the patient.  > 50% of time was spent in direct patient care.  Signed, [name]. Flora Lipps, MD, [facility]  [facility]  [address], [address] [place], [place] [address] [phone]  10/06/2021 3:44 PM

## 2021-10-06 NOTE — Patient Instructions (Signed)
Medication Instructions:  °The current medical regimen is effective;  continue present plan and medications. ° °*If you need a refill on your cardiac medications before your next appointment, please call your pharmacy* ° ° °Follow-Up: °At [facility], you and your health needs are our priority.  As part of our continuing mission to provide you with exceptional heart care, we have created designated Provider Care Teams.  These Care Teams include your primary Cardiologist (physician) and Advanced Practice Providers (APPs -  Physician Assistants and Nurse Practitioners) who all work together to provide you with the care you need, when you need it. ° °We recommend signing up for the patient portal called "MyChart".  Sign up information is provided on this After Visit Summary.  MyChart is used to connect with patients for Virtual Visits (Telemedicine).  Patients are able to view lab/test results, encounter notes, upcoming appointments, etc.  Non-urgent messages can be sent to your provider as well.   °To learn more about what you can do with MyChart, go to [email or web address].   ° °Your next appointment:   °As needed ° °The format for your next appointment:   °In Person ° °Provider:   °Stanberry T O'Neal, MD   ° ° °

## 2021-10-07 NOTE — Patient Instructions (Signed)
Below is our plan:  We will continue venlafaxine 150mg  daily and Emgailty every 30 days. Try rizatriptan for abortive therapy. You can take with Tylenol. Please take 1 tablet at onset of headache. May take 1 additional tablet in 2 hours if needed. Do not take more than 2 tablets in 24 hours or more than 10 in a month.  .   Please make sure you are staying well hydrated. I recommend 50-60 ounces daily. Well balanced diet and regular exercise encouraged. Consistent sleep schedule with 6-8 hours recommended.   Please continue follow up with care team as directed.   Follow up with me in 3-4 months   You may receive a survey regarding today's visit. I encourage you to leave honest feed back as I do use this information to improve patient care. Thank you for seeing me today!

## 2021-10-07 NOTE — Progress Notes (Signed)
Chief Complaint  Patient presents with   Follow-up    Pt alone, rm 16. Pt states that posterior right side there is always discomfort there.       HISTORY OF PRESENT ILLNESS:  10/11/21 ALL:  [name] is a 45 y.o. female here today for follow up for migraines. She was having worsening headaches and dizziness at last f/u with Dr Anne Hahn. He continued venlafaxine 150mg  daily and added propranolol. She could not tolerate propranolol and was switched to [facility] 08/2021. She has taken 1 injection (ordered for 64ml). She uses Tylenol extra strength that has variable results. She has 3-4 migraines every week. BP is 148/101. She takes amlodipine 5mg  daily for BP. She reports home readings are usually 140's/80-90's. She initially denies snoring but then does state that her family has recorded her sleeping and she does snore a little. She reports strong family history of sleep apnea. She continues to have intermittent dizziness.    HISTORY (copied from Dr 0m' previous note)  Ms. is a 45 year old right-handed black female with a history of frequent migraine headaches and vertigo.  The patient is having least 2-3 headaches a week at this point, she is on Effexor taking 150 mg daily with minimal benefit.  She cannot tolerate Topamax previously.  She oftentimes has to lie down with the headache.  She is having a lot of anxiety issues as well.  She is not sleeping well at night.  She returns to the office today for an evaluation.   REVIEW OF SYSTEMS: Out of a complete 14 system review of symptoms, the patient complains only of the following symptoms, headaches, dizziness and all other reviewed systems are negative.   ALLERGIES: Allergies  Allergen Reactions   Fish Allergy Anaphylaxis and Shortness Of Breath   Naproxen Anxiety, Other (See Comments) and Anaphylaxis    Vaginal bleeding and anxiety Other reaction(s): Bleeding (intolerance) Stomach bleeding Vaginal bleeding and  anxiety   Other Anaphylaxis and Rash   Sulfa Antibiotics Anxiety, Other (See Comments) and Anaphylaxis    Vaginal bleeding and anxiety Vaginal bleeding and anxiety Vaginal bleeding and anxiety   Valium [Diazepam] Nausea And Vomiting   Asa [Aspirin]    Topamax [Topiramate]     Heart racing     HOME MEDICATIONS: Outpatient Medications Prior to Visit  Medication Sig Dispense Refill   albuterol (VENTOLIN HFA) 108 (90 Base) MCG/ACT inhaler Inhale into the lungs.     amLODipine (NORVASC) 5 MG tablet Take 1 tablet (5 mg total) by mouth daily. 90 tablet 0   calcium-vitamin D (RA HI-CAL PLUS VITAMIN D) 500-200 MG-UNIT tablet Take 1 tablet by mouth daily with breakfast. 90 tablet 1   venlafaxine XR (EFFEXOR-XR) 150 MG 24 hr capsule Take 1 capsule (150 mg total) by mouth daily with breakfast. 30 capsule 0   Vitamin D, Ergocalciferol, (DRISDOL) 1.25 MG (50000 UNIT) CAPS capsule Take 1 capsule (50,000 Units total) by mouth every 7 (seven) days. 12 capsule 0   Galcanezumab-gnlm ([name]) 120 MG/ML SOSY Inject 120 mg into the skin every 30 (thirty) days. 1.12 mL 4   propranolol (INDERAL) 20 MG tablet Take 2 tablets (40 mg total) by mouth 2 (two) times daily. 120 tablet 3   No facility-administered medications prior to visit.     PAST MEDICAL HISTORY: Past Medical History:  Diagnosis Date   Hypertension    Iron deficiency anemia    Migraine    Migraine headache with aura 11/09/2020  Sarcoidosis    Vertigo      PAST SURGICAL HISTORY: Past Surgical History:  Procedure Laterality Date   CESAREAN SECTION     CESAREAN SECTION     KNEE SURGERY     TUBAL LIGATION       FAMILY HISTORY: Family History  Problem Relation Age of Onset   Lupus Mother    Healthy Father    Heart failure Maternal Grandmother    Diabetes Maternal Grandmother    Hypertension Maternal Grandmother    Thyroid disease Maternal Grandmother    Diabetes Maternal Grandfather    Diabetes Paternal Grandmother     Thyroid disease Sister    Anemia Sister      SOCIAL HISTORY: Social History   Socioeconomic History   Marital status: Legally Separated    Spouse name: Not on file   Number of children: 3   Years of education: 14   Highest education level: Not on file  Occupational History   Occupation: unemployed 11/09/20   Occupation: disabled  Tobacco Use   Smoking status: Every Day    Packs/day: 1.00    Years: 17.00    Pack years: 17.00    Types: Cigarettes   Smokeless tobacco: Never  Vaping Use   Vaping Use: Never used  Substance and Sexual Activity   Alcohol use: Never    Comment: occ   Drug use: No   Sexual activity: Not on file  Other Topics Concern   Not on file  Social History Narrative   Lives with son   Right Handed   Drinks 6-8 cups caffeine daily   Social Determinants of Health   Financial Resource Strain: Not on file  Food Insecurity: Not on file  Transportation Needs: Not on file  Physical Activity: Not on file  Stress: Not on file  Social Connections: Not on file  Intimate Partner Violence: Not on file     PHYSICAL EXAM  Vitals:   10/11/21 1337  BP: (!) 148/101  Pulse: 73  Weight: 193 lb (87.5 kg)  Height: 5\' 5"  (1.651 m)   Body mass index is 32.12 kg/m.  Generalized: Well developed, in no acute distress  Cardiology: normal rate and rhythm, no murmur auscultated  Respiratory: clear to auscultation bilaterally    Neurological examination  Mentation: Alert oriented to time, place, history taking. Follows all commands speech and language fluent Cranial nerve II-XII: Pupils were equal round reactive to light. Extraocular movements were full, visual field were full on confrontational test. Facial sensation and strength were normal. Head turning and shoulder shrug  were normal and symmetric. Motor: The motor testing reveals 5 over 5 strength of all 4 extremities. Good symmetric motor tone is noted throughout.  Sensory: Sensory testing is intact to soft  touch on all 4 extremities. No evidence of extinction is noted.  Coordination: Cerebellar testing reveals good finger-nose-finger and heel-to-shin bilaterally.  Gait and station: Gait is normal.     DIAGNOSTIC DATA (LABS, IMAGING, TESTING) - I reviewed patient records, labs, notes, testing and imaging myself where available.  Lab Results  Component Value Date   WBC 4.7 03/12/2021   HGB 12.5 03/12/2021   HCT 38.7 03/12/2021   MCV 87.2 03/12/2021   PLT 303.0 03/12/2021      Component Value Date/Time   NA 138 03/12/2021 1534   K 3.8 03/12/2021 1534   CL 104 03/12/2021 1534   CO2 27 03/12/2021 1534   GLUCOSE 77 03/12/2021 1534   BUN 7 03/12/2021  1534   CREATININE 0.67 03/12/2021 1534   CALCIUM 9.6 03/12/2021 1534   PROT 8.3 03/12/2021 1534   ALBUMIN 4.0 03/12/2021 1534   AST 17 03/12/2021 1534   ALT 15 03/12/2021 1534   ALKPHOS 92 03/12/2021 1534   BILITOT 0.3 03/12/2021 1534   GFRNONAA >60 06/04/2020 0229   GFRAA >60 06/04/2020 0229   Lab Results  Component Value Date   CHOL 143 07/28/2016   HDL 63.60 07/28/2016   LDLCALC 71 07/28/2016   TRIG 45.0 07/28/2016   CHOLHDL 2 07/28/2016   Lab Results  Component Value Date   HGBA1C 5.3 09/05/2019   Lab Results  Component Value Date   VITAMINB12 230 03/12/2021   Lab Results  Component Value Date   TSH 0.61 03/12/2021    No flowsheet data found.   No flowsheet data found.   ASSESSMENT AND PLAN  45 y.o. year old female  has a past medical history of Hypertension, Iron deficiency anemia, Migraine, Migraine headache with aura (11/09/2020), Sarcoidosis, and Vertigo. here with    Migraine with aura and without status migrainosus, not intractable  Vertigo   [name] returns for migraine follow up. She has taken 1 [name] injection about 6-7 weeks ago. She is not sure if it has helped. I have provided two sample pens and advised she take both today. She will continue 1 injection every 30 days following. She may  continue Tylenol for abortive therapy. We will try her on rizatriptan. Dosage and side effects reviewed. She will call with any concerns. I have encouraged her to monitor BP closely. It appears that BP is mildly elevated at home as well. We have also discussed concerns of sleep apnea. Continue close follow up with PCP for vertigo. She was encouraged to consider sleep study in future. Healthy lifestyle habits advised. She will follow up in 3-4 months.    No orders of the defined types were placed in this encounter.    Meds ordered this encounter  Medications   Galcanezumab-gnlm ([name]) 120 MG/ML SOSY    Sig: Inject 120 mg into the skin every 30 (thirty) days.    Dispense:  3 mL    Refill:  3    Order Specific Question:   Supervising Provider    Answer:   Anson Fret [id number]   rizatriptan (MAXALT-MLT) 10 MG disintegrating tablet    Sig: Take 1 tablet (10 mg total) by mouth as needed for migraine. May repeat in 2 hours if needed    Dispense:  9 tablet    Refill:  11    Order Specific Question:   Supervising Provider    Answer:   Anson Fret [[id number]]       [name], MSN, FNP-C 10/11/2021, 2:04 PM  [facility] [address], [address] [place], [place] [address] [phone]

## 2021-10-11 ENCOUNTER — Encounter: Payer: Self-pay | Admitting: Family Medicine

## 2021-10-11 ENCOUNTER — Ambulatory Visit (INDEPENDENT_AMBULATORY_CARE_PROVIDER_SITE_OTHER): Payer: BC Managed Care – PPO | Admitting: Family Medicine

## 2021-10-11 VITALS — BP 148/101 | HR 73 | Ht 65.0 in | Wt 193.0 lb

## 2021-10-11 DIAGNOSIS — G43109 Migraine with aura, not intractable, without status migrainosus: Secondary | ICD-10-CM

## 2021-10-11 DIAGNOSIS — R42 Dizziness and giddiness: Secondary | ICD-10-CM

## 2021-10-11 MED ORDER — RIZATRIPTAN BENZOATE 10 MG PO TBDP: 10.0000 mg | ORAL_TABLET | ORAL | 11 refills | Status: DC | PRN

## 2021-10-11 MED ORDER — EMGALITY 120 MG/ML ~~LOC~~ SOSY: 120.0000 mg | PREFILLED_SYRINGE | SUBCUTANEOUS | 3 refills | Status: DC

## 2021-10-18 ENCOUNTER — Other Ambulatory Visit: Payer: Self-pay | Admitting: *Deleted

## 2021-10-18 DIAGNOSIS — R0683 Snoring: Secondary | ICD-10-CM

## 2021-10-26 ENCOUNTER — Ambulatory Visit (HOSPITAL_BASED_OUTPATIENT_CLINIC_OR_DEPARTMENT_OTHER): Payer: BC Managed Care – PPO | Admitting: Family

## 2021-11-07 ENCOUNTER — Other Ambulatory Visit: Payer: Self-pay | Admitting: Internal Medicine

## 2021-11-10 ENCOUNTER — Other Ambulatory Visit: Payer: Self-pay | Admitting: Family

## 2021-11-12 ENCOUNTER — Ambulatory Visit: Payer: BC Managed Care – PPO | Admitting: Adult Health

## 2021-11-23 DIAGNOSIS — Z0271 Encounter for disability determination: Secondary | ICD-10-CM

## 2021-11-24 ENCOUNTER — Other Ambulatory Visit: Payer: Self-pay | Admitting: Internal Medicine

## 2021-12-09 ENCOUNTER — Other Ambulatory Visit: Payer: Self-pay | Admitting: Family Medicine

## 2021-12-09 DIAGNOSIS — R519 Headache, unspecified: Secondary | ICD-10-CM

## 2021-12-09 DIAGNOSIS — R0683 Snoring: Secondary | ICD-10-CM

## 2021-12-16 ENCOUNTER — Ambulatory Visit (INDEPENDENT_AMBULATORY_CARE_PROVIDER_SITE_OTHER): Payer: BC Managed Care – PPO | Admitting: Internal Medicine

## 2021-12-16 ENCOUNTER — Encounter: Payer: Self-pay | Admitting: Internal Medicine

## 2021-12-16 ENCOUNTER — Other Ambulatory Visit: Payer: Self-pay

## 2021-12-16 DIAGNOSIS — M79672 Pain in left foot: Secondary | ICD-10-CM

## 2021-12-16 DIAGNOSIS — E559 Vitamin D deficiency, unspecified: Secondary | ICD-10-CM

## 2021-12-16 DIAGNOSIS — R0683 Snoring: Secondary | ICD-10-CM | POA: Diagnosis not present

## 2021-12-16 DIAGNOSIS — M79671 Pain in right foot: Secondary | ICD-10-CM | POA: Diagnosis not present

## 2021-12-16 MED ORDER — VITAMIN D3 50 MCG (2000 UT) PO CAPS: 2000.0000 [IU] | ORAL_CAPSULE | Freq: Every day | ORAL | 3 refills | Status: DC

## 2021-12-16 MED ORDER — B COMPLEX PLUS PO TABS: 1.0000 | ORAL_TABLET | Freq: Every day | ORAL | 3 refills | Status: DC

## 2021-12-16 MED ORDER — VITAMIN D (ERGOCALCIFEROL) 1.25 MG (50000 UNIT) PO CAPS: 50000.0000 [IU] | ORAL_CAPSULE | ORAL | 0 refills | Status: DC

## 2021-12-16 MED ORDER — METHYLPREDNISOLONE 4 MG PO TBPK: ORAL_TABLET | ORAL | 0 refills | Status: DC

## 2021-12-16 NOTE — Assessment & Plan Note (Addendum)
Vit D to restart Risks associated with treatment noncompliance were discussed. Compliance was encouraged.

## 2021-12-16 NOTE — Assessment & Plan Note (Addendum)
New Pulm ref to r/o OSA

## 2021-12-16 NOTE — Assessment & Plan Note (Addendum)
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

## 2021-12-16 NOTE — Patient Instructions (Signed)
[place]

## 2021-12-16 NOTE — Progress Notes (Signed)
Subjective:  Patient ID: [name], female    DOB: [birth year]  Age: 46 y.o. MRN: [id number]  CC: Foot Pain (Pt states she is pain in (L) foot. Sometimes her foot swells.. she states there is a knot under her middle toe)   HPI [name] presents for L>>R foot pain - severe x 1-2 weeks; h/o remote injury She is complaining of snoring  Outpatient Medications Prior to Visit  Medication Sig Dispense Refill   albuterol (VENTOLIN HFA) 108 (90 Base) MCG/ACT inhaler Inhale into the lungs.     amLODipine (NORVASC) 5 MG tablet TAKE 1 TABLET (5 MG TOTAL) BY MOUTH DAILY.Marland Kitchen NEED OFFICE VISIT 90 tablet 3   calcium-vitamin D (RA HI-CAL PLUS VITAMIN D) 500-200 MG-UNIT tablet Take 1 tablet by mouth daily with breakfast. 90 tablet 1   Galcanezumab-gnlm ([name]) 120 MG/ML SOSY Inject 120 mg into the skin every 30 (thirty) days. 3 mL 3   rizatriptan (MAXALT-MLT) 10 MG disintegrating tablet Take 1 tablet (10 mg total) by mouth as needed for migraine. May repeat in 2 hours if needed 9 tablet 11   venlafaxine XR (EFFEXOR-XR) 150 MG 24 hr capsule Take 1 capsule (150 mg total) by mouth daily with breakfast. 30 capsule 0   Vitamin D, Ergocalciferol, (DRISDOL) 1.25 MG (50000 UNIT) CAPS capsule Take 1 capsule (50,000 Units total) by mouth every 7 (seven) days. 12 capsule 0   No facility-administered medications prior to visit.    ROS: Review of Systems  Constitutional:  Negative for activity change, appetite change, chills, fatigue and unexpected weight change.  HENT:  Negative for congestion, mouth sores, sinus pressure and trouble swallowing.   Eyes:  Negative for visual disturbance.  Respiratory:  Negative for cough and chest tightness.   Gastrointestinal:  Negative for abdominal pain and nausea.  Genitourinary:  Negative for difficulty urinating, frequency and vaginal pain.  Musculoskeletal:  Positive for arthralgias and gait problem. Negative for back pain.  Skin:  Negative for pallor and  rash.  Neurological:  Negative for dizziness, tremors, weakness, numbness and headaches.  Psychiatric/Behavioral:  Negative for confusion and sleep disturbance.    Objective:  BP 122/84 (BP Location: Left Arm)    Pulse 64    Temp 97.8 F (36.6 C) (Oral)    SpO2 98%   BP Readings from Last 3 Encounters:  12/16/21 122/84  10/11/21 (!) 148/101  10/06/21 132/78    Wt Readings from Last 3 Encounters:  10/11/21 193 lb (87.5 kg)  10/06/21 193 lb 12.8 oz (87.9 kg)  06/09/21 190 lb (86.2 kg)    Physical Exam Constitutional:      General: She is not in acute distress.    Appearance: She is well-developed. She is obese.  HENT:     Head: Normocephalic.     Right Ear: External ear normal.     Left Ear: External ear normal.     Nose: Nose normal.  Eyes:     General:        Right eye: No discharge.        Left eye: No discharge.     Conjunctiva/sclera: Conjunctivae normal.     Pupils: Pupils are equal, round, and reactive to light.  Neck:     Thyroid: No thyromegaly.     Vascular: No JVD.     Trachea: No tracheal deviation.  Cardiovascular:     Rate and Rhythm: Normal rate and regular rhythm.     Heart sounds: Normal heart sounds.  Pulmonary:     Effort: No respiratory distress.     Breath sounds: No stridor. No wheezing.  Abdominal:     General: Bowel sounds are normal. There is no distension.     Palpations: Abdomen is soft. There is no mass.     Tenderness: There is no abdominal tenderness. There is no guarding or rebound.  Musculoskeletal:        General: No tenderness.     Cervical back: Normal range of motion and neck supple. No rigidity.  Lymphadenopathy:     Cervical: No cervical adenopathy.  Skin:    Findings: No erythema or rash.  Neurological:     Cranial Nerves: No cranial nerve deficit.     Motor: No abnormal muscle tone.     Coordination: Coordination normal.     Deep Tendon Reflexes: Reflexes normal.  Psychiatric:        Behavior: Behavior normal.         Thought Content: Thought content normal.        Judgment: Judgment normal.    Lab Results  Component Value Date   WBC 4.7 03/12/2021   HGB 12.5 03/12/2021   HCT 38.7 03/12/2021   PLT 303.0 03/12/2021   GLUCOSE 77 03/12/2021   CHOL 143 07/28/2016   TRIG 45.0 07/28/2016   HDL 63.60 07/28/2016   LDLCALC 71 07/28/2016   ALT 15 03/12/2021   AST 17 03/12/2021   NA 138 03/12/2021   K 3.8 03/12/2021   CL 104 03/12/2021   CREATININE 0.67 03/12/2021   BUN 7 03/12/2021   CO2 27 03/12/2021   TSH 0.61 03/12/2021   INR 1.23 09/28/2014   HGBA1C 5.3 09/05/2019    No results found.  Assessment & Plan:   Problem List Items Addressed This Visit     Foot pain, bilateral    Worse L>>R ?midfoot capsulitis Prescribed Medrol pack Podiatry ref      Relevant Orders   Ambulatory referral to Podiatry   Snoring    New Pulm ref to r/o OSA      Relevant Orders   Ambulatory referral to Pulmonology   Vitamin D deficiency    Vit D to restart Risks associated with treatment noncompliance were discussed. Compliance was encouraged.          Meds ordered this encounter  Medications   B Complex-Folic Acid (B COMPLEX PLUS) TABS    Sig: Take 1 tablet by mouth daily.    Dispense:  100 tablet    Refill:  3   Cholecalciferol (VITAMIN D3) 50 MCG (2000 UT) capsule    Sig: Take 1 capsule (2,000 Units total) by mouth daily.    Dispense:  100 capsule    Refill:  3   Vitamin D, Ergocalciferol, (DRISDOL) 1.25 MG (50000 UNIT) CAPS capsule    Sig: Take 1 capsule (50,000 Units total) by mouth every 7 (seven) days.    Dispense:  6 capsule    Refill:  0   methylPREDNISolone (MEDROL DOSEPAK) 4 MG TBPK tablet    Sig: As directed    Dispense:  21 tablet    Refill:  0      Follow-up: Return in about 6 weeks (around 01/27/2022) for a follow-up visit.  Walker Kehr, MD

## 2021-12-21 ENCOUNTER — Telehealth: Payer: Self-pay

## 2021-12-21 NOTE — Telephone Encounter (Signed)
Pt is calling requesting a antibiotic Rx for sinus infection pt forgot to ask Dr. Posey Rea when she was here on 1/26.  Please advise Pt CB [phone]

## 2021-12-21 NOTE — Telephone Encounter (Signed)
See below

## 2021-12-21 NOTE — Telephone Encounter (Signed)
I would defer that question to Dr. Camila Li who saw patient.

## 2021-12-22 NOTE — Telephone Encounter (Signed)
Please schedule office visit with any provider.  Okay virtual.  Thanks 

## 2021-12-30 ENCOUNTER — Encounter: Payer: Self-pay | Admitting: Sports Medicine

## 2021-12-30 ENCOUNTER — Other Ambulatory Visit: Payer: Self-pay

## 2021-12-30 ENCOUNTER — Ambulatory Visit (INDEPENDENT_AMBULATORY_CARE_PROVIDER_SITE_OTHER): Payer: BC Managed Care – PPO

## 2021-12-30 ENCOUNTER — Ambulatory Visit (INDEPENDENT_AMBULATORY_CARE_PROVIDER_SITE_OTHER): Payer: BC Managed Care – PPO | Admitting: Sports Medicine

## 2021-12-30 ENCOUNTER — Other Ambulatory Visit: Payer: Self-pay | Admitting: Sports Medicine

## 2021-12-30 DIAGNOSIS — M778 Other enthesopathies, not elsewhere classified: Secondary | ICD-10-CM

## 2021-12-30 DIAGNOSIS — R52 Pain, unspecified: Secondary | ICD-10-CM

## 2021-12-30 DIAGNOSIS — M7742 Metatarsalgia, left foot: Secondary | ICD-10-CM | POA: Diagnosis not present

## 2021-12-30 DIAGNOSIS — M25571 Pain in right ankle and joints of right foot: Secondary | ICD-10-CM

## 2021-12-30 DIAGNOSIS — M25572 Pain in left ankle and joints of left foot: Secondary | ICD-10-CM

## 2021-12-30 DIAGNOSIS — G8929 Other chronic pain: Secondary | ICD-10-CM

## 2021-12-30 MED ORDER — TRIAMCINOLONE ACETONIDE 10 MG/ML IJ SUSP
10.0000 mg | Freq: Once | INTRAMUSCULAR | Status: AC
  Administered 2021-12-30: 10 mg

## 2021-12-30 NOTE — Progress Notes (Signed)
Subjective: [name] is a 46 y.o. female patient who presents to office for evaluation of left greater than right foot pain states that the pain is worse at the ball of her foot states that it hurts with pressure been going on for the last 2 to 3 weeks patient denies any known injury or trauma but states that she recently moved from a house that had a point to steps and states that now anytime she puts her foot down on the cold hardwood floor it constantly throbs around the ball of the left foot.  Patient also admits to a remote history of bilateral ankle pain mainly on the left with a history of an work-related injury back in 2017 or 2018 that was treated for a really long time and patient reports that she had multiple cortisone shots and multiple treatments and the pain did not get better states that she is still struggles sometimes with pain in her ankles.  Patient was given steroid pack on 12/16/2021 by PCP with no relief.  Patient denies any other pedal complaints at this time.  Patient Active Problem List   Diagnosis Date Noted   Foot pain, bilateral 12/16/2021   Snoring 12/16/2021   Convulsions (HCC) 03/12/2021   Vertigo 11/09/2020   Migraine headache with aura 11/09/2020   History of COVID-19 09/16/2020   Degenerative arthritis of knee, bilateral 10/07/2019   Suspected COVID-19 virus infection 05/07/2019   Chronic pain of left ankle 04/13/2018   Sinusitis 11/25/2016   Vitamin D deficiency 07/28/2016   Routine general medical examination at a health care facility 04/25/2016   Obesity 04/25/2016   Tobacco abuse 04/25/2016   Community acquired pneumonia 09/28/2014   Anemia 09/28/2014   Menorrhagia 09/28/2014   Thrombocytopenia (HCC) 09/28/2014   CAP (community acquired pneumonia) 09/28/2014    Current Outpatient Medications on File Prior to Visit  Medication Sig Dispense Refill   albuterol (VENTOLIN HFA) 108 (90 Base) MCG/ACT inhaler Inhale into the lungs.     amLODipine  (NORVASC) 5 MG tablet TAKE 1 TABLET (5 MG TOTAL) BY MOUTH DAILY.Marland Kitchen NEED OFFICE VISIT 90 tablet 3   B Complex-Folic Acid (B COMPLEX PLUS) TABS Take 1 tablet by mouth daily. 100 tablet 3   calcium-vitamin D (RA HI-CAL PLUS VITAMIN D) 500-200 MG-UNIT tablet Take 1 tablet by mouth daily with breakfast. 90 tablet 1   Cholecalciferol (VITAMIN D3) 50 MCG (2000 UT) capsule Take 1 capsule (2,000 Units total) by mouth daily. 100 capsule 3   D3-1000 25 MCG (1000 UT) capsule Take 1,000 Units by mouth daily.     Galcanezumab-gnlm ([name]) 120 MG/ML SOSY Inject 120 mg into the skin every 30 (thirty) days. 3 mL 3   methylPREDNISolone (MEDROL DOSEPAK) 4 MG TBPK tablet As directed 21 tablet 0   rizatriptan (MAXALT-MLT) 10 MG disintegrating tablet Take 1 tablet (10 mg total) by mouth as needed for migraine. May repeat in 2 hours if needed 9 tablet 11   venlafaxine XR (EFFEXOR-XR) 150 MG 24 hr capsule Take 1 capsule (150 mg total) by mouth daily with breakfast. 30 capsule 0   Vitamin D, Ergocalciferol, (DRISDOL) 1.25 MG (50000 UNIT) CAPS capsule Take 1 capsule (50,000 Units total) by mouth every 7 (seven) days. 12 capsule 0   Vitamin D, Ergocalciferol, (DRISDOL) 1.25 MG (50000 UNIT) CAPS capsule Take 1 capsule (50,000 Units total) by mouth every 7 (seven) days. 6 capsule 0   No current facility-administered medications on file prior to visit.  Allergies  Allergen Reactions   Fish Allergy Anaphylaxis and Shortness Of Breath   Naproxen Anxiety, Other (See Comments) and Anaphylaxis    Vaginal bleeding and anxiety Other reaction(s): Bleeding (intolerance) Stomach bleeding Vaginal bleeding and anxiety   Other Anaphylaxis and Rash   Sulfa Antibiotics Anxiety, Other (See Comments) and Anaphylaxis    Vaginal bleeding and anxiety Vaginal bleeding and anxiety Vaginal bleeding and anxiety   Valium [Diazepam] Nausea And Vomiting   Asa [Aspirin]    Topamax [Topiramate]     Heart racing    Objective:  General:  Alert and oriented x3 in no acute distress  Dermatology: No open lesions bilateral lower extremities, no webspace macerations, no ecchymosis bilateral, all nails x 10 are well manicured.  Vascular: Dorsalis Pedis and Posterior Tibial pedal pulses palpable, Capillary Fill Time 3 seconds,(+) pedal hair growth bilateral, minimal focal edema noted to the dorsum of the left foot at the second metatarsophalangeal joint.  Neurology: Michaell Cowing sensation intact via light touch bilateral.  Musculoskeletal: Moderate tenderness to palpation at the second metatarsal phalangeal joint of the left foot.  There is minor pain noted to the medial and lateral ankles of the left and right foot there is no frank instability range of motion appears to be within normal limits with no major guarding however patient does have history of chronic pain to ankles.  Gait: Antalgic gait  Xrays  Left and right foot   Impression: No acute osseous findings, mild soft tissue swelling of the left forefoot  Assessment and Plan: Problem List Items Addressed This Visit       Other   Chronic pain of left ankle   Relevant Medications   triamcinolone acetonide (KENALOG) 10 MG/ML injection 10 mg (Start on 12/30/2021  6:45 PM)   Other Visit Diagnoses     Pain    -  Primary   Relevant Orders   DG Foot Complete Left (Completed)   Metatarsalgia of left foot       Chronic pain of right ankle       Relevant Medications   triamcinolone acetonide (KENALOG) 10 MG/ML injection 10 mg (Start on 12/30/2021  6:45 PM)   Capsulitis of left foot       Relevant Medications   triamcinolone acetonide (KENALOG) 10 MG/ML injection 10 mg (Start on 12/30/2021  6:45 PM)         -Complete examination performed -Xrays reviewed -Discussed treatement options for likely capsulitis at the left second metatarsophalangeal joint and chronic pain in ankles -Advised patient that we will focus on the acute pain at the ball of the foot on the left at this  time -After oral consent and aseptic prep, injected a mixture containing 1 ml of 2% plain lidocaine, 1 ml 0.5% plain marcaine, 0.5 ml of kenalog 10 and 0.5 ml of dexamethasone phosphate into left second metatarsophalangeal joint to patient's tolerance without complication. Post-injection care discussed with patient.  -Dispensed metatarsal pad sleeve for patient to use as directed -Advised good supportive shoes and refrain from walking barefooted -Advised patient that if her ankle still continues to hurt once her pain is better in the ball of the foot we can consider further work-up of her ankle pain however I advised patient that since his pain is chronic it is very likely that there is not much that I can offer to help this pain but if she would like I could repeat x-ray or MRI imaging and compare it to previous imaging that she has  had before if she can locate those records -Patient to return to office in 1 month or sooner if condition worsens.  Asencion Islam, DPM

## 2022-01-05 ENCOUNTER — Encounter (HOSPITAL_COMMUNITY): Payer: Self-pay

## 2022-01-05 ENCOUNTER — Other Ambulatory Visit: Payer: Self-pay

## 2022-01-05 ENCOUNTER — Emergency Department (HOSPITAL_COMMUNITY)
Admission: EM | Admit: 2022-01-05 | Discharge: 2022-01-06 | Disposition: A | Discharge status: Home / Self Care | Payer: BC Managed Care – PPO | Attending: Emergency Medicine | Admitting: Emergency Medicine

## 2022-01-05 ENCOUNTER — Emergency Department (HOSPITAL_COMMUNITY): Payer: BC Managed Care – PPO

## 2022-01-05 DIAGNOSIS — R519 Headache, unspecified: Secondary | ICD-10-CM | POA: Diagnosis not present

## 2022-01-05 DIAGNOSIS — R079 Chest pain, unspecified: Secondary | ICD-10-CM | POA: Diagnosis not present

## 2022-01-05 DIAGNOSIS — M542 Cervicalgia: Secondary | ICD-10-CM | POA: Diagnosis not present

## 2022-01-05 DIAGNOSIS — I10 Essential (primary) hypertension: Secondary | ICD-10-CM | POA: Diagnosis not present

## 2022-01-05 DIAGNOSIS — M79602 Pain in left arm: Secondary | ICD-10-CM | POA: Diagnosis not present

## 2022-01-05 DIAGNOSIS — R0789 Other chest pain: Secondary | ICD-10-CM

## 2022-01-05 LAB — CBC
HCT: 38.1 % (ref 36.0–46.0)
Hemoglobin: 12.1 g/dL (ref 12.0–15.0)
MCH: 28.6 pg (ref 26.0–34.0)
MCHC: 31.8 g/dL (ref 30.0–36.0)
MCV: 90.1 fL (ref 80.0–100.0)
Platelets: 261 10*3/uL (ref 150–400)
RBC: 4.23 MIL/uL (ref 3.87–5.11)
RDW: 12.2 % (ref 11.5–15.5)
WBC: 5.7 10*3/uL (ref 4.0–10.5)
nRBC: 0 % (ref 0.0–0.2)

## 2022-01-05 LAB — BASIC METABOLIC PANEL
Anion gap: 10 (ref 5–15)
BUN: 9 mg/dL (ref 6–20)
CO2: 24 mmol/L (ref 22–32)
Calcium: 8.8 mg/dL — ABNORMAL LOW (ref 8.9–10.3)
Chloride: 105 mmol/L (ref 98–111)
Creatinine, Ser: 0.84 mg/dL (ref 0.44–1.00)
GFR, Estimated: >60 mL/min (ref >60)
Glucose, Bld: 94 mg/dL (ref 70–99)
Potassium: 3.6 mmol/L (ref 3.5–5.1)
Sodium: 139 mmol/L (ref 135–145)

## 2022-01-05 LAB — TROPONIN I (HIGH SENSITIVITY): Troponin I (High Sensitivity): 3 ng/L (ref <18)

## 2022-01-05 MED ORDER — HYDROCODONE-ACETAMINOPHEN 5-325 MG PO TABS: 1.0000 | ORAL_TABLET | Freq: Once | ORAL | Status: DC

## 2022-01-05 MED ORDER — AMLODIPINE BESYLATE 5 MG PO TABS
5.0000 mg | ORAL_TABLET | Freq: Once | ORAL | Status: AC
  Administered 2022-01-05: 5 mg via ORAL
  Filled 2022-01-05: qty 1

## 2022-01-05 MED ORDER — CYCLOBENZAPRINE HCL 10 MG PO TABS
10.0000 mg | ORAL_TABLET | Freq: Once | ORAL | Status: AC
  Administered 2022-01-05: 10 mg via ORAL
  Filled 2022-01-05: qty 1

## 2022-01-05 MED ORDER — CYCLOBENZAPRINE HCL 10 MG PO TABS: 10.0000 mg | ORAL_TABLET | Freq: Two times a day (BID) | ORAL | 0 refills | Status: DC | PRN

## 2022-01-05 NOTE — ED Triage Notes (Signed)
PT BIB [facility] from home after having central chest pain with radiation to L shoulder and intermittent radiation down L arm since yesterday. H/x HTN but not currently taking her meds. Allergic to ASA but got 0.4 nitro with EMS with minimal improvement. BP 160/100  P 74

## 2022-01-05 NOTE — Discharge Instructions (Signed)
Please return to ED with any new symptoms such as chest pain, shortness of breath, nausea, vomiting, headache Please pick up your prescription medication I sent in for you Please follow-up with your PCP in the next 3 to 5 days for ongoing management Please begin taking your home blood pressure medication
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
## 2022-01-07 ENCOUNTER — Other Ambulatory Visit: Payer: Self-pay | Admitting: Internal Medicine

## 2022-01-07 ENCOUNTER — Ambulatory Visit (INDEPENDENT_AMBULATORY_CARE_PROVIDER_SITE_OTHER): Payer: BC Managed Care – PPO | Admitting: Neurology

## 2022-01-07 ENCOUNTER — Encounter: Payer: Self-pay | Admitting: Neurology

## 2022-01-07 VITALS — BP 134/87 | HR 69 | Ht 65.0 in | Wt 198.5 lb

## 2022-01-07 DIAGNOSIS — R519 Headache, unspecified: Secondary | ICD-10-CM | POA: Diagnosis not present

## 2022-01-07 DIAGNOSIS — D862 Sarcoidosis of lung with sarcoidosis of lymph nodes: Secondary | ICD-10-CM | POA: Diagnosis not present

## 2022-01-07 DIAGNOSIS — R0683 Snoring: Secondary | ICD-10-CM | POA: Diagnosis not present

## 2022-01-07 DIAGNOSIS — F17210 Nicotine dependence, cigarettes, uncomplicated: Secondary | ICD-10-CM

## 2022-01-07 DIAGNOSIS — G43109 Migraine with aura, not intractable, without status migrainosus: Secondary | ICD-10-CM | POA: Diagnosis not present

## 2022-01-07 DIAGNOSIS — Z72821 Inadequate sleep hygiene: Secondary | ICD-10-CM

## 2022-01-07 MED ORDER — PRENATAL PLUS VITAMIN/MINERAL 27-1 MG PO TABS: 1.0000 | ORAL_TABLET | Freq: Every day | ORAL | Status: DC

## 2022-01-07 MED ORDER — BUPROPION HCL ER (XL) 150 MG PO TB24: 150.0000 mg | ORAL_TABLET | Freq: Every day | ORAL | 1 refills | Status: DC

## 2022-01-07 NOTE — Progress Notes (Signed)
[facility]    Provider:  Melvyn Novas, MD  Primary Care Physician:  Myrlene Broker, MD [address] [place] [address]     Referring Provider: [name], Np [address] [place],  [place] [address]          Chief Complaint according to patient   Patient presents with:     New Patient (Initial Visit)           HISTORY OF PRESENT ILLNESS:  [name] is a 46 y.o. year old African American female patient , formerly of dr Anne Hahn, and now followed by  amy lomax, NP seen on 01/07/2022.  Chief concern according to patient :  Migraine with aura, going to bed with headaches, waking up with headaches. Sometimes she wakes with headaches and sometimes headaches wake her.  Not a sharp or tabbing headaches.  She feels left occipital and frontal pressure, not pulsating. Eyes are tearing . Left mostly. Facial tenderness.  Flickering lights are the aura, she will be nauseated , photophobia.   Dr Anne Hahn treated her with Effexor, and he prescribed [name]. She had an allergy to Topiramate, but describes tingling in jaw and fingers, changed taste of sodas. .  [name] cut down on migraines 4 times a week to less intense, 2 a week.   [name]  has a past medical history of Hypertension, Iron deficiency anemia, sleep related headaches, poor sleep hygiene, active tobacco abuser,  Migraine headache with aura (11/09/2020), Sarcoidosis ( lung and legs) , and Vertigo.    Sleep relevant medical history: Nocturia 2-3 , Night terrors into adult hood , No ENT surgery.    Family medical /sleep history: MGM with OSA, migraine in all her 3 children.    Social history:  Patient was working as Naval architect,  disabled from vertigo, anxiety, vestibular migraines. and lives in a household with 3 children and 1 grandchild-  The patient currently works as a Administrator, sports.  One dog is present. Tobacco use- never quit- she is a smoker (!).  1 ppd. ETOH use none ,   Caffeine intake in form of Soda( 2-3 cans a day ). Regular exercise- none .   Hobbies :cooking , dancing.       Sleep habits are as follows: The patient's dinner time is between 7 PM. The patient goes to bed at 10.30 PM and will be asleep by 1 AM- TV  is on.  Bedroom is cool, but Tv is on !!!! She continues to sleep for 4 hours, wakes for several bathroom breaks, the first time at 3-4 AM.   The preferred sleep position is sideways, with the support of one firm  pillow.  Dreams are reportedly frequent/vivid. Fragmented.  Variable  5-6  AM is the usual rise time. The patient wakes up spontaneously. She goes back to sleep by 10-11 AM.    She reports not feeling refreshed or restored in AM, with symptoms such as dry mouth, morning headaches, and residual fatigue.  Naps are taken daily- frequently, lasting from 30 to 180 minutes and are more refreshing than nocturnal sleep.    Review of Systems: Out of a complete 14 system review, the patient complains of only the following symptoms, and all other reviewed systems are negative.:  Fatigue, sleepiness , snoring, fragmented sleep, Insomnia , poor sleep routines.    How likely are you to doze in the following situations: 0 = not likely, 1 =  slight chance, 2 = moderate chance, 3 = high chance   Sitting and Reading? Watching Television? Sitting inactive in a public place (theater or meeting)? As a passenger in a car for an hour without a break? Lying down in the afternoon when circumstances permit? Sitting and talking to someone? Sitting quietly after lunch without alcohol? In a car, while stopped for a few minutes in traffic?   Total = 17/ 24 points   FSS endorsed at 60/ 63 points.  Very high sleepiness, b very high fatigue. Headaches. RLS with iron deficincy.  Cardiology: Seen in 2020 for dizziness. Normal echo and normal monitor.  She reports since her last visit she continued to have episodes of palpitations.  They occur 1-2 times per  week.  They can last seconds.  She reports that mainly occur at night.  Reports rapid heartbeat sensation that is random.  Headaches - She reports no identifiable trigger, except poor sleep-    She reports it may be associated with stress.  Symptoms resolved quickly.  She denies any chest pain or significant shortness of breath.   She is still smoking cigarettes.  Up to 1 pack/day.  She is working on quitting.  Blood pressure well controlled today.  She is being treated for migraines with her neurologist.  Seems to be doing okay.  She cannot tolerate propranolol.Thyroid studies earlier this year were normal.    Social History   Socioeconomic History   Marital status: Legally Separated    Spouse name: Not on file   Number of children: 3   Years of education: 14   Highest education level: Not on file  Occupational History   Occupation: unemployed 11/09/20   Occupation: disabled  Tobacco Use   Smoking status: Every Day    Packs/day: 1.00    Years: 17.00    Pack years: 17.00    Types: Cigarettes   Smokeless tobacco: Never  Vaping Use   Vaping Use: Never used  Substance and Sexual Activity   Alcohol use: Not Currently   Drug use: No   Sexual activity: Not on file  Other Topics Concern   Not on file  Social History Narrative   Lives with son   Right Handed   Drinks 6-8 cups caffeine daily.   Social Determinants of Health   Financial Resource Strain: Not on file  Food Insecurity: Not on file  Transportation Needs: Not on file  Physical Activity: Not on file  Stress: Not on file  Social Connections: Not on file    Family History  Problem Relation Age of Onset   Lupus Mother    Healthy Father    Heart failure Maternal Grandmother    Diabetes Maternal Grandmother    Hypertension Maternal Grandmother    Thyroid disease Maternal Grandmother    Diabetes Maternal Grandfather    Diabetes Paternal Grandmother    Thyroid disease Sister    Anemia Sister     Past Medical  History:  Diagnosis Date   Hypertension    Iron deficiency anemia    Migraine    Migraine headache with aura 11/09/2020   Sarcoidosis    Vertigo     Past Surgical History:  Procedure Laterality Date   CESAREAN SECTION     CESAREAN SECTION     KNEE SURGERY     TUBAL LIGATION       Current Outpatient Medications on File Prior to Visit  Medication Sig Dispense Refill   albuterol (VENTOLIN HFA) 108 (90  Base) MCG/ACT inhaler Inhale into the lungs.     amLODipine (NORVASC) 5 MG tablet TAKE 1 TABLET (5 MG TOTAL) BY MOUTH DAILY.Marland Kitchen NEED OFFICE VISIT 90 tablet 3   B Complex-Folic Acid (B COMPLEX PLUS) TABS Take 1 tablet by mouth daily. 100 tablet 3   calcium-vitamin D (RA HI-CAL PLUS VITAMIN D) 500-200 MG-UNIT tablet Take 1 tablet by mouth daily with breakfast. 90 tablet 1   cyclobenzaprine (FLEXERIL) 10 MG tablet Take 1 tablet (10 mg total) by mouth 2 (two) times daily as needed for muscle spasms. 20 tablet 0   D3-1000 25 MCG (1000 UT) capsule Take 1,000 Units by mouth daily.     Galcanezumab-gnlm ([name]) 120 MG/ML SOSY Inject 120 mg into the skin every 30 (thirty) days. 3 mL 3   rizatriptan (MAXALT-MLT) 10 MG disintegrating tablet Take 1 tablet (10 mg total) by mouth as needed for migraine. May repeat in 2 hours if needed 9 tablet 11   venlafaxine XR (EFFEXOR-XR) 150 MG 24 hr capsule Take 1 capsule (150 mg total) by mouth daily with breakfast. 30 capsule 0   Vitamin D, Ergocalciferol, (DRISDOL) 1.25 MG (50000 UNIT) CAPS capsule Take 1 capsule (50,000 Units total) by mouth every 7 (seven) days. 6 capsule 0   No current facility-administered medications on file prior to visit.    Allergies  Allergen Reactions   Fish Allergy Anaphylaxis and Shortness Of Breath   Naproxen Anxiety, Other (See Comments) and Anaphylaxis    Vaginal bleeding and anxiety Other reaction(s): Bleeding (intolerance) Stomach bleeding Vaginal bleeding and anxiety   Other Anaphylaxis and Rash   Sulfa Antibiotics  Anxiety, Other (See Comments) and Anaphylaxis    Vaginal bleeding and anxiety Vaginal bleeding and anxiety Vaginal bleeding and anxiety   Valium [Diazepam] Nausea And Vomiting   Asa [Aspirin]    Topamax [Topiramate]     Heart racing    Physical exam:  Today's Vitals   01/07/22 1024  BP: 134/87  Pulse: 69  Weight: 198 lb 8 oz (90 kg)  Height: 5\' 5"  (1.651 m)   Body mass index is 33.03 kg/m.   Wt Readings from Last 3 Encounters:  01/07/22 198 lb 8 oz (90 kg)  01/05/22 196 lb (88.9 kg)  10/11/21 193 lb (87.5 kg)     Ht Readings from Last 3 Encounters:  01/07/22 5\' 5"  (1.651 m)  01/05/22 5\' 5"  (1.651 m)  10/11/21 5\' 5"  (1.651 m)      General: The patient is awake, alert and appears not in acute distress. The patient is well groomed. Head: Normocephalic, atraumatic. Neck is supple. Mallampati 1,  neck circumference:15.25 inches .  Nasal airflow  patent.  Retrognathia is not seen.  Dental status: biological Cardiovascular:  Regular rate and cardiac rhythm by pulse,  without distended neck veins. Respiratory: Lungs are clear to auscultation.  Skin:  Without evidence of ankle edema, or rash. Trunk: The patient's posture is erect.   Neurologic exam : The patient is awake and alert, oriented to place and time.   Memory subjective described as intact.  Attention span & concentration ability appears normal.  Speech is fluent,  without  dysarthria, dysphonia or aphasia.  Mood and affect are appropriate.   Cranial nerves: no loss of smell or taste reported  Pupils are equal and briskly reactive to light.  Funduscopic exam: no palor, no edema.  .  Extraocular movements in vertical and horizontal planes were intact and without nystagmus. No Diplopia. Visual fields by finger  perimetry are intact. Hearing was intact to soft voice and finger rubbing.    Facial sensation intact to fine touch.  Facial motor strength is symmetric and tongue and uvula move midline.  Neck ROM :  rotation, tilt and flexion extension were normal for age and shoulder shrug was symmetrical.    Motor exam:  Symmetric bulk, tone and ROM.   Normal tone without cog wheeling, symmetric grip strength .   Sensory:  Fine touch, pinprick and vibration were tested  and  normal.  Proprioception tested in the upper extremities was normal.   Coordination: Rapid alternating movements in the fingers/hands were of normal speed.  The Finger-to-nose maneuver was intact without evidence of ataxia, dysmetria or tremor.   Gait and station: Patient could rise unassisted from a seated position, walked without assistive device.  the patient turned with 3 steps.  Toe and heel walk were deferred.  Deep tendon reflexes: in the  upper and lower extremities are symmetric and intact.  Babinski response was deferred .       After spending a total time of  45  minutes face to face and additional time for physical and neurologic examination, review of laboratory studies,  personal review of imaging studies, reports and results of other testing and review of referral information / records as far as provided in visit, I have established the following assessments:  1)insomnia, broken sleep- poor sleep habits-  exchange TV for audiobook or meditation sounds.  2) smoker - with sarcoidosis. NEEDS TO QUIT- chantix/ wellbutrin- but she reports she had a seizure after drinking ALCOHOL . No alcohol in 2 years.  3) morning and sleep headaches are likely related to migraine, but can be worse when snoring  4) RLS, likely related to iron deficiency anemia.    My Plan is to proceed with:  1) HST for screening.  2) wellbutrin 150 mg a day for smoking cessation.  3) set a rise time, and consider melatonin po. 5 mg or less.  4) prenatal vitamin daily to cover iron and B 12 deficiency.   I would like to thank Myrlene Brokerrawford, Elizabeth A, MD and Shawnie DapperLomax, Amy, Np [address] [place] [id number] for allowing me to meet with  and to take care of this pleasant patient.   Follow up through our NP within 2-3 month.   CC: I will share my notes with PCP.Marland Kitchen.  Electronically signed by: Melvyn Novasarmen Tahje Borawski, MD 01/07/2022 10:42 AM  [facility] and [facility] certified by The [employer] and Diplomate of the [employer]. Board certified In Neurology through the ABPN, Fellow of the [employer]. Medical Director of [facility].

## 2022-01-07 NOTE — Patient Instructions (Addendum)
Bupropion Tablets (for smoking cessation aid) What is this medication? BUPROPION (byoo PROE pee on) treats depression but also treats desires such as drinking alcohol or smoking tobacco- . It increases norepinephrine and dopamine in the brain, hormones that help regulate mood.  It belongs to a group of medications called NDRIs. This medicine may be used for other purposes; ask your health care provider or pharmacist if you have questions. COMMON BRAND NAME(S): Wellbutrin What should I tell my care team before I take this medication? They need to know if you have any of these conditions: An eating disorder, such as anorexia or bulimia Bipolar disorder or psychosis Diabetes or high blood sugar, treated with medication Glaucoma Heart disease, previous heart attack, or irregular heart beat Head injury or brain tumor High blood pressure Kidney or liver disease Seizures Suicidal thoughts or a previous suicide attempt Tourette's syndrome Weight loss An unusual or allergic reaction to bupropion, other medications, foods, dyes, or preservatives Pregnant or trying to become pregnant Breast-feeding How should I use this medication? Take this medication by mouth with a glass of water. Follow the directions on the prescription label. You can take it with or without food. If it upsets your stomach, take it with food. Take your medication at regular intervals. Do not take your medication more often than directed. Do not stop taking this medication suddenly except upon the advice of your care team. Stopping this medication too quickly may cause serious side effects or your condition may worsen. A special MedGuide will be given to you by the pharmacist with each prescription and refill. Be sure to read this information carefully each time. Talk to your care team regarding the use of this medication in children. Special care may be needed. Overdosage: If you think you have taken too much of this medicine  contact a poison control center or emergency room at once. NOTE: This medicine is only for you. Do not share this medicine with others. What if I miss a dose? If you miss a dose, take it as soon as you can. If it is less than four hours to your next dose, take only that dose and skip the missed dose. Do not take double or extra doses. What may interact with this medication? Do not take this medication with any of the following: Linezolid MAOIs like Azilect, Carbex, Eldepryl, Marplan, Nardil, and Parnate Methylene blue (injected into a vein) Other medications that contain bupropion like Zyban This medication may also interact with the following: Alcohol Certain medications for anxiety or sleep Certain medications for blood pressure like metoprolol, propranolol Certain medications for depression or psychotic disturbances Certain medications for HIV or AIDS like efavirenz, lopinavir, nelfinavir, ritonavir Certain medications for irregular heart beat like propafenone, flecainide Certain medications for Parkinson's disease like amantadine, levodopa Certain medications for seizures like carbamazepine, phenytoin, phenobarbital Cimetidine Clopidogrel Cyclophosphamide Digoxin Furazolidone Isoniazid Nicotine Orphenadrine Procarbazine Steroid medications like prednisone or cortisone Stimulant medications for attention disorders, weight loss, or to stay awake Tamoxifen Theophylline Thiotepa Ticlopidine Tramadol Warfarin This list may not describe all possible interactions. Give your health care provider a list of all the medicines, herbs, non-prescription drugs, or dietary supplements you use. Also tell them if you smoke, drink alcohol, or use illegal drugs. Some items may interact with your medicine. What should I watch for while using this medication? Tell your care team if your symptoms do not get better or if they get worse. Visit your care team for regular checks on your progress.  Because it may take several weeks to see the full effects of this medication, it is important to continue your treatment as prescribed. Watch for new or worsening thoughts of suicide or depression. This includes sudden changes in mood, behavior, or thoughts. These changes can happen at any time but are more common in the beginning of treatment or after a change in dose. Call your care team right away if you experience these thoughts or worsening depression. Manic episodes may happen in patients with bipolar disorder who take this medication. Watch for changes in feelings or behaviors such as feeling anxious, nervous, agitated, panicky, irritable, hostile, aggressive, impulsive, severely restless, overly excited and hyperactive, or trouble sleeping. These symptoms can happen at anytime but are more common in the beginning of treatment or after a change in dose. Call your care team right away if you notice any of these symptoms. This medication may cause serious skin reactions. They can happen weeks to months after starting the medication. Contact your care team right away if you notice fevers or flu-like symptoms with a rash. The rash may be red or purple and then turn into blisters or peeling of the skin. Or, you might notice a red rash with swelling of the face, lips or lymph nodes in your neck or under your arms. Avoid drinks that contain alcohol while taking this medication. Drinking large amounts of alcohol, using sleeping or anxiety medications, or quickly stopping the use of these agents while taking this medication may increase your risk for a seizure. Do not drive or use heavy machinery until you know how this medication affects you. This medication can impair your ability to perform these tasks. Do not take this medication close to bedtime. It may prevent you from sleeping. Your mouth may get dry. Chewing sugarless gum or sucking hard candy, and drinking plenty of water may help. Contact your care  team if the problem does not go away or is severe. What side effects may I notice from receiving this medication? Side effects that you should report to your care team as soon as possible: Allergic reactions--skin rash, itching, hives, swelling of the face, lips, tongue, or throat Increase in blood pressure Mood and behavior changes--anxiety, nervousness, confusion, hallucinations, irritability, hostility, thoughts of suicide or self-harm, worsening mood, feelings of depression Redness, blistering, peeling, or loosening of the skin, including inside the mouth Seizures Sudden eye pain or change in vision such as blurry vision, seeing halos around lights, vision loss Side effects that usually do not require medical attention (report to your care team if they continue or are bothersome): Constipation Dizziness Dry mouth Loss of appetite Nausea Tremors or shaking Trouble sleeping This list may not describe all possible side effects. Call your doctor for medical advice about side effects. You may report side effects to FDA at 1-800-FDA-1088. Where should I keep my medication? Keep out of the reach of children and pets. Store at room temperature between 20 and 25 degrees C (68 and 77 degrees F), away from direct sunlight and moisture. Keep tightly closed. Throw away any unused medication after the expiration date. NOTE: This sheet is a summary. It may not cover all possible information. If you have questions about this medicine, talk to your doctor, pharmacist, or health care provider.  2022 Elsevier/Gold Standard (2021-01-20 00:00:00) Restless Legs Syndrome Restless legs syndrome is a condition that causes uncomfortable feelings or sensations in the legs, especially while sitting or lying down. The sensations usually cause an overwhelming urge  to move the legs. The arms can also sometimes be affected. The condition can range from mild to severe. The symptoms often interfere with a person's  ability to sleep. What are the causes? The cause of this condition is not known. What increases the risk? The following factors may make you more likely to develop this condition: Being older than 50. Pregnancy. Being a woman. In general, the condition is more common in women than in men. A family history of the condition. Having iron deficiency. Overuse of caffeine, nicotine, or alcohol. Certain medical conditions, such as kidney disease, Parkinson's disease, or nerve damage. Certain medicines, such as those for high blood pressure, nausea, colds, allergies, depression, and some heart conditions. What are the signs or symptoms? The main symptom of this condition is uncomfortable sensations in the legs, such as: Pulling. Tingling. Prickling. Throbbing. Crawling. Burning. Usually, the sensations: Affect both sides of the body. Are worse when you sit or lie down. Are worse at night. These may make it difficult to fall asleep. Make you have a strong urge to move your legs. Are temporarily relieved by moving your legs or standing. The arms can also be affected, but this is rare. People who have this condition often have tiredness during the day because of their lack of sleep at night. How is this diagnosed? This condition may be diagnosed based on: Your symptoms. Blood tests. In some cases, you may be monitored in a sleep lab by a specialist (a sleep study). This can detect any disruptions in your sleep. How is this treated? This condition is treated by managing the symptoms. This may include: Lifestyle changes, such as exercising, using relaxation techniques, and avoiding caffeine, alcohol, or tobacco. Iron supplements. Medicines. Parkinson's medications may be tried first. Anti-seizure medications can also be helpful. Follow these instructions at home: General instructions Take over-the-counter and prescription medicines only as told by your health care provider. Use methods to  help relieve the uncomfortable sensations, such as: Massaging your legs. Walking or stretching. Taking a cold or hot bath. Keep all follow-up visits. This is important. Lifestyle   Practice good sleep habits. For example, go to bed and get up at the same time every day. Most adults should get 7-9 hours of sleep each night. Exercise regularly. Try to get at least 30 minutes of exercise most days of the week. Practice ways of relaxing, such as yoga or meditation. Avoid caffeine and alcohol. Do not use any products that contain nicotine or tobacco. These products include cigarettes, chewing tobacco, and vaping devices, such as e-cigarettes. If you need help quitting, ask your health care provider. Where to find more information [employer] of Neurological Disorders and Stroke: [employer] Contact a health care provider if: Your symptoms get worse or they do not improve with treatment. Summary Restless legs syndrome is a condition that causes uncomfortable feelings or sensations in the legs, especially while sitting or lying down. The symptoms often interfere with your ability to sleep. This condition is treated by managing the symptoms. You may need to make lifestyle changes or take medicines. This information is not intended to replace advice given to you by your health care provider. Make sure you discuss any questions you have with your health care provider. Document Revised: 06/20/2021 Document Reviewed: 06/20/2021 Elsevier Patient Education  2022 [place]. Insomnia Insomnia is a sleep disorder that makes it difficult to fall asleep or stay asleep. Insomnia can cause fatigue, low energy, difficulty concentrating, mood swings, and poor  performance at work or school. There are three different ways to classify insomnia: Difficulty falling asleep. Difficulty staying asleep. Waking up too early in the morning. Any type of insomnia can be long-term (chronic) or short-term  (acute). Both are common. Short-term insomnia usually lasts for three months or less. Chronic insomnia occurs at least three times a week for longer than three months. What are the causes? Insomnia may be caused by another condition, situation, or substance, such as: Anxiety. Certain medicines. Gastroesophageal reflux disease (GERD) or other gastrointestinal conditions. Asthma or other breathing conditions. Restless legs syndrome, sleep apnea, or other sleep disorders. Chronic pain. Menopause. Stroke. Abuse of alcohol, tobacco, or illegal drugs. Mental health conditions, such as depression. Caffeine. Neurological disorders, such as Alzheimer's disease. An overactive thyroid (hyperthyroidism). Sometimes, the cause of insomnia may not be known. What increases the risk? Risk factors for insomnia include: Gender. Women are affected more often than men. Age. Insomnia is more common as you get older. Stress. Lack of exercise. Irregular work schedule or working night shifts. Traveling between different time zones. Certain medical and mental health conditions. What are the signs or symptoms? If you have insomnia, the main symptom is having trouble falling asleep or having trouble staying asleep. This may lead to other symptoms, such as: Feeling fatigued or having low energy. Feeling nervous about going to sleep. Not feeling rested in the morning. Having trouble concentrating. Feeling irritable, anxious, or depressed. How is this diagnosed? This condition may be diagnosed based on: Your symptoms and medical history. Your health care provider may ask about: Your sleep habits. Any medical conditions you have. Your mental health. A physical exam. How is this treated? Treatment for insomnia depends on the cause. Treatment may focus on treating an underlying condition that is causing insomnia. Treatment may also include: Medicines to help you sleep. Counseling or therapy. Lifestyle  adjustments to help you sleep better. Follow these instructions at home: Eating and drinking  Limit or avoid alcohol, caffeinated beverages, and cigarettes, especially close to bedtime. These can disrupt your sleep. Do not eat a large meal or eat spicy foods right before bedtime. This can lead to digestive discomfort that can make it hard for you to sleep. Sleep habits  Keep a sleep diary to help you and your health care provider figure out what could be causing your insomnia. Write down: When you sleep. When you wake up during the night. How well you sleep. How rested you feel the next day. Any side effects of medicines you are taking. What you eat and drink. Make your bedroom a dark, comfortable place where it is easy to fall asleep. Put up shades or blackout curtains to block light from outside. Use a white noise machine to block noise. Keep the temperature cool. Limit screen use before bedtime. This includes: Watching TV. Using your smartphone, tablet, or computer. Stick to a routine that includes going to bed and waking up at the same times every day and night. This can help you fall asleep faster. Consider making a quiet activity, such as reading, part of your nighttime routine. Try to avoid taking naps during the day so that you sleep better at night. Get out of bed if you are still awake after 15 minutes of trying to sleep. Keep the lights down, but try reading or doing a quiet activity. When you feel sleepy, go back to bed. General instructions Take over-the-counter and prescription medicines only as told by your health care provider. Exercise regularly,  as told by your health care provider. Avoid exercise starting several hours before bedtime. Use relaxation techniques to manage stress. Ask your health care provider to suggest some techniques that may work well for you. These may include: Breathing exercises. Routines to release muscle tension. Visualizing peaceful  scenes. Make sure that you drive carefully. Avoid driving if you feel very sleepy. Keep all follow-up visits as told by your health care provider. This is important. Contact a health care provider if: You are tired throughout the day. You have trouble in your daily routine due to sleepiness. You continue to have sleep problems, or your sleep problems get worse. Get help right away if: You have serious thoughts about hurting yourself or someone else. If you ever feel like you may hurt yourself or others, or have thoughts about taking your own life, get help right away. You can go to your nearest emergency department or call: Your local emergency services (911 in the [place] A suicide crisis helpline, such as the [facility] at 351-818-8911 or 988 in the [place]. This is open 24 hours a day. Summary Insomnia is a sleep disorder that makes it difficult to fall asleep or stay asleep. Insomnia can be long-term (chronic) or short-term (acute). Treatment for insomnia depends on the cause. Treatment may focus on treating an underlying condition that is causing insomnia. Keep a sleep diary to help you and your health care provider figure out what could be causing your insomnia. This information is not intended to replace advice given to you by your health care provider. Make sure you discuss any questions you have with your health care provider. Document Revised: 06/02/2021 Document Reviewed: 09/17/2020 Elsevier Patient Education  2022 [employer].

## 2022-01-12 DIAGNOSIS — Z20822 Contact with and (suspected) exposure to covid-19: Secondary | ICD-10-CM | POA: Diagnosis not present

## 2022-01-12 DIAGNOSIS — U071 COVID-19: Secondary | ICD-10-CM | POA: Diagnosis not present

## 2022-01-19 DIAGNOSIS — Z20822 Contact with and (suspected) exposure to covid-19: Secondary | ICD-10-CM | POA: Diagnosis not present

## 2022-01-19 DIAGNOSIS — Z03818 Encounter for observation for suspected exposure to other biological agents ruled out: Secondary | ICD-10-CM | POA: Diagnosis not present

## 2022-01-29 ENCOUNTER — Other Ambulatory Visit: Payer: Self-pay | Admitting: Neurology

## 2022-01-31 ENCOUNTER — Ambulatory Visit (INDEPENDENT_AMBULATORY_CARE_PROVIDER_SITE_OTHER): Payer: BC Managed Care – PPO | Admitting: Neurology

## 2022-01-31 DIAGNOSIS — D862 Sarcoidosis of lung with sarcoidosis of lymph nodes: Secondary | ICD-10-CM

## 2022-01-31 DIAGNOSIS — G43109 Migraine with aura, not intractable, without status migrainosus: Secondary | ICD-10-CM

## 2022-01-31 DIAGNOSIS — R0683 Snoring: Secondary | ICD-10-CM | POA: Diagnosis not present

## 2022-01-31 DIAGNOSIS — R519 Headache, unspecified: Secondary | ICD-10-CM

## 2022-01-31 DIAGNOSIS — F17210 Nicotine dependence, cigarettes, uncomplicated: Secondary | ICD-10-CM

## 2022-01-31 DIAGNOSIS — Z72821 Inadequate sleep hygiene: Secondary | ICD-10-CM

## 2022-02-01 ENCOUNTER — Ambulatory Visit: Payer: BC Managed Care – PPO | Admitting: Family Medicine

## 2022-02-06 DIAGNOSIS — R519 Headache, unspecified: Secondary | ICD-10-CM | POA: Insufficient documentation

## 2022-02-06 DIAGNOSIS — Z72821 Inadequate sleep hygiene: Secondary | ICD-10-CM | POA: Insufficient documentation

## 2022-02-06 DIAGNOSIS — F17210 Nicotine dependence, cigarettes, uncomplicated: Secondary | ICD-10-CM | POA: Insufficient documentation

## 2022-02-06 NOTE — Procedures (Signed)
PATIENT'S NAME:  [name], [name] ?DOB:      [birth year]      ?MR#:    [id number]     ?DATE OF RECORDING: 01/31/2022 ?REFERRING [name]:  [name], NP ?Study Performed:   Baseline Polysomnogram ?HISTORY:  [name] is a 46 year-old [place] American female and former patient of Dr. Lesia Sago, and now followed by [name], NP. I have seen her for the first time on 01/07/2022.  ?Chief concern:  Migraine with aura, going to bed with headaches, waking up with headaches. Sometimes she wakes with headaches and sometimes the headaches wake her. She explained that it is not a sharp or stabbing headache when she is woken by it.  ?She feels mainly left occipital and frontal pressure, not pulsating. Eyes are tearing, mostly the left. Facial tenderness is reported and visual changes-. Flickering lights are the aura, she will also be nauseated and has photophobia. We are here to see if sleep apnea or hypoxia can explain these findings and her hypersomnia.  ?  ?Dr Anne Hahn treated her with Effexor, and he prescribed [name]. She had reported an allergy to Topiramate but describes the ?allergic reaction? as a tingling in jaw and fingers, changed taste of sodas. This is an expected side effect. [name] cut down on migraines from 4-6 times a week to a less intense 2 times a week.   ? ?[name] has a medical history of Hypertension, Iron deficiency anemia, reports sleep related headaches, she has poor sleep hygiene, is an active tobacco abuser, and uses an inhaler for smoker's cough- caffeine user, Migraine headache with aura (11/09/2020), she reportedly has Sarcoidosis (lung), and Vertigo. ? ?The patient endorsed the Epworth Sleepiness Scale at 17/24 points.   ?The patient's weight 198 pounds with a height of 65 (inches), resulting in a BMI of 33.1 kg/m2. ?The patient's neck circumference measured 15.3 inches. ? ?CURRENT MEDICATIONS: Ventolin HFA, Norvasc, B Complex Plus, RA HI-CAL Plus [name], Flexeril, D3, [name],  Maxalt-MLT, Effexor XR, Drisdol ?  ?PROCEDURE:  This is a multichannel digital polysomnogram utilizing the Somnostar 11.2 system.  Electrodes and sensors were applied and monitored per AASM Specifications.   EEG, EOG, Chin and Limb EMG, were sampled at 200 Hz.  ECG, Snore and Nasal Pressure, Thermal Airflow, Respiratory Effort, CPAP Flow and Pressure, Oximetry was sampled at 50 Hz. Digital video and audio were recorded.     ? ?BASELINE STUDY ? ?Lights Out was at 20:50 and Lights On at 04:24.  Total recording time (TRT) was 454.5 minutes, with a total sleep time (TST) of 238.5 minutes.   The patient's sleep latency was 125 minutes.  REM latency was 48.5 minutes.  The sleep efficiency was 52.5 %.  ?   ?SLEEP ARCHITECTURE: WASO (Wake after sleep onset) was 37.5 minutes.  There were 4 minutes in Stage N1, 188.5 minutes Stage N2, 29 minutes Stage N3 and 17 minutes in Stage REM.  The percentage of Stage N1 was 1.7%, Stage N2 was 79.%, Stage N3 was 12.2% and Stage R (REM sleep) was 7.1%.  ? ?RESPIRATORY ANALYSIS:  There were a total of 4 respiratory events:  0 obstructive apneas, 0 central apneas and 0 mixed apneas with a total of 0 apneas and an apnea index (AI) of 0 /hour. There were 4 hypopneas with a hypopnea index of 1.0 /hour. The patient also had 4 respiratory event related arousals (RERAs).     ?The total APNEA/HYPOPNEA INDEX (AHI) was 1.0/hour and the  total RESPIRATORY DISTURBANCE INDEX was  2.0 /hour.  0 events occurred in REM sleep and 8 events in NREM. The REM AHI was 0 /hour, versus a non-REM AHI of 1.1/h. The patient spent 194.5 minutes of total sleep time in the supine position and 44 minutes in non-supine. The supine AHI was 1.2 versus a non-supine AHI of 0.0. ? ?OXYGEN SATURATION & C02:  The Wake baseline 02 saturation was 99%, with the lowest being 92%. Time spent below 89% saturation equaled 0 minutes. ?  ?PERIODIC LIMB MOVEMENTS:  The patient had a total of 0 Periodic Limb Movements. ?There were limb  movements present before she went to sleep,  ? ?The arousals were noted as: 50 were spontaneous, 0 were associated with PLMs, 0 were associated with respiratory events. ?Audio and video analysis did not show any abnormal or unusual movements, behaviors, phonations or vocalizations during sleep.  The patient took two bathroom breaks. ?Loud Snoring and Coughing was noted. ?EKG was in keeping with normal sinus rhythm (NSR). ?The patient was woken several times by coughing but there was no hypoxia.  ? ?The patient arrived having taken her bedtime meds already. She drank caffeinated soda when she arrived. She used her phone between 20.50 and 22.51 making several phone calls.  ?She again received a phone call at 00.05 minutes, which woke her (!!) and remained on the phone from 3.32 to 4. 25 AM when she terminated the study.  ? ? ?IMPRESSION: ? ?Fragmented sleep by coughing, by allowing the phone to ring (all patients are advised to silence their phones and to put the screen side down at lights-off time- that was at 20.51), and sleep wasn't helped by snacking and drinking caffeinated sodas during bedtime.  ?Primary Snoring but not apnea or PLMs. ?Very poor sleep habits, sleep routines and absolutely no sense of limiting external stimuli such as screen time to achieve better sleep.  ? ? ?RECOMMENDATIONS: ? ?Advise referral to cognitive behavior therapy. This is not an organic sleep disorder and there is no organic/ sleep physiological explanation for her headaches/ migraines. Sleep is fragmented and her behaviour is counterproductive. The origin of the poor sleep pattern and hypersomnia may be in a mood disorder. ?Tobacco Smoking and reported Sarcoidosis can explain the sleep interruption by coughing and snoring. Audible breathing was recorded even before sleep onset. Please have her Primary Care Physician make any needed referral for smoking cessation and pulmonary health.  ?Her migrainous headaches have benefited from the  last added medication and she can follow up with Primary care from here on. No need to have Neurology follow this condition.  ? ? ?I certify that I have reviewed the entire raw data recording prior to the issuance of this report in accordance with the Standards of Accreditation of the American Academy of Sleep Medicine (AASM) ? ? ? ?Melvyn Novas, MD ?Diplomat, American Board of Psychiatry and Neurology  ?Diplomat, Biomedical engineer of Sleep Medicine ?Medical Director, [facility] ? ? ?

## 2022-02-08 ENCOUNTER — Encounter: Payer: Self-pay | Admitting: Neurology

## 2022-02-17 ENCOUNTER — Institutional Professional Consult (permissible substitution): Payer: BC Managed Care – PPO | Admitting: Pulmonary Disease

## 2022-02-21 ENCOUNTER — Institutional Professional Consult (permissible substitution): Payer: BC Managed Care – PPO | Admitting: Neurology

## 2022-03-02 DIAGNOSIS — M542 Cervicalgia: Secondary | ICD-10-CM | POA: Diagnosis not present

## 2022-03-02 DIAGNOSIS — R269 Unspecified abnormalities of gait and mobility: Secondary | ICD-10-CM | POA: Diagnosis not present

## 2022-03-02 DIAGNOSIS — M545 Low back pain, unspecified: Secondary | ICD-10-CM | POA: Diagnosis not present

## 2022-03-02 DIAGNOSIS — R293 Abnormal posture: Secondary | ICD-10-CM | POA: Diagnosis not present

## 2022-03-10 ENCOUNTER — Institutional Professional Consult (permissible substitution): Payer: BC Managed Care – PPO | Admitting: Adult Health

## 2022-03-11 DIAGNOSIS — R269 Unspecified abnormalities of gait and mobility: Secondary | ICD-10-CM | POA: Diagnosis not present

## 2022-03-11 DIAGNOSIS — M542 Cervicalgia: Secondary | ICD-10-CM | POA: Diagnosis not present

## 2022-03-11 DIAGNOSIS — M545 Low back pain, unspecified: Secondary | ICD-10-CM | POA: Diagnosis not present

## 2022-03-11 DIAGNOSIS — R293 Abnormal posture: Secondary | ICD-10-CM | POA: Diagnosis not present

## 2022-03-14 ENCOUNTER — Other Ambulatory Visit: Payer: Self-pay | Admitting: Internal Medicine

## 2022-03-14 ENCOUNTER — Other Ambulatory Visit: Payer: Self-pay | Admitting: Neurology

## 2022-03-14 ENCOUNTER — Other Ambulatory Visit (HOSPITAL_COMMUNITY): Payer: Self-pay

## 2022-03-14 MED ORDER — ALBUTEROL SULFATE HFA 108 (90 BASE) MCG/ACT IN AERS
1.0000 | INHALATION_SPRAY | Freq: Four times a day (QID) | RESPIRATORY_TRACT | 0 refills | Status: DC | PRN
  Filled 2022-03-14: qty 6.7, 17d supply, fill #0
  Filled 2022-09-27: qty 6.7, 25d supply, fill #0

## 2022-03-14 MED ORDER — D3-1000 25 MCG (1000 UT) PO CAPS
1000.0000 [IU] | ORAL_CAPSULE | Freq: Every day | ORAL | 0 refills | Status: DC
  Filled 2022-03-14 – 2022-09-27 (×3): qty 30, 30d supply, fill #0

## 2022-03-18 ENCOUNTER — Other Ambulatory Visit (HOSPITAL_COMMUNITY): Payer: Self-pay

## 2022-03-21 DIAGNOSIS — M542 Cervicalgia: Secondary | ICD-10-CM | POA: Diagnosis not present

## 2022-03-21 DIAGNOSIS — R269 Unspecified abnormalities of gait and mobility: Secondary | ICD-10-CM | POA: Diagnosis not present

## 2022-03-21 DIAGNOSIS — R293 Abnormal posture: Secondary | ICD-10-CM | POA: Diagnosis not present

## 2022-03-21 DIAGNOSIS — M545 Low back pain, unspecified: Secondary | ICD-10-CM | POA: Diagnosis not present

## 2022-03-26 ENCOUNTER — Other Ambulatory Visit (HOSPITAL_COMMUNITY): Payer: Self-pay

## 2022-03-30 DIAGNOSIS — Z0271 Encounter for disability determination: Secondary | ICD-10-CM

## 2022-04-04 ENCOUNTER — Institutional Professional Consult (permissible substitution): Payer: BC Managed Care – PPO | Admitting: Pulmonary Disease

## 2022-04-06 ENCOUNTER — Encounter: Payer: Self-pay | Admitting: Emergency Medicine

## 2022-04-06 ENCOUNTER — Ambulatory Visit (INDEPENDENT_AMBULATORY_CARE_PROVIDER_SITE_OTHER): Payer: BC Managed Care – PPO | Admitting: Emergency Medicine

## 2022-04-06 VITALS — BP 132/74 | HR 75 | Temp 98.0°F | Ht 65.0 in | Wt 196.4 lb

## 2022-04-06 DIAGNOSIS — R0981 Nasal congestion: Secondary | ICD-10-CM

## 2022-04-06 DIAGNOSIS — R519 Headache, unspecified: Secondary | ICD-10-CM | POA: Diagnosis not present

## 2022-04-06 DIAGNOSIS — H8190 Unspecified disorder of vestibular function, unspecified ear: Secondary | ICD-10-CM | POA: Insufficient documentation

## 2022-04-06 DIAGNOSIS — J0101 Acute recurrent maxillary sinusitis: Secondary | ICD-10-CM

## 2022-04-06 MED ORDER — AMOXICILLIN-POT CLAVULANATE 875-125 MG PO TABS: 1.0000 | ORAL_TABLET | Freq: Two times a day (BID) | ORAL | 0 refills | Status: AC

## 2022-04-06 NOTE — Patient Instructions (Signed)

## 2022-04-06 NOTE — Progress Notes (Signed)
[name] ?46 y.o. ? ? ?Chief Complaint  ?Patient presents with  ? sneezing  ? Cough  ? Headache  ? ? ?HISTORY OF PRESENT ILLNESS: ?Acute problem visit today.  Patient of Dr. Hillard Danker. ?This is a 46 y.o. female complaining of 1 week history of sinus congestion and headaches progressively getting worse.  Has history of recurrent sinus infections. ?Denies fever or chills.  Denies epistaxis.  No other associated symptoms. ?No other complaints or medical concerns today. ? ?Cough ?Associated symptoms include headaches. Pertinent negatives include no chest pain, chills, fever, rash or shortness of breath.  ?Headache  ?Associated symptoms include coughing. Pertinent negatives include no abdominal pain, fever, nausea or vomiting.  ? ? ?Prior to Admission medications   ?Medication Sig Start Date End Date Taking? Authorizing Provider  ?albuterol (VENTOLIN HFA) 108 (90 Base) MCG/ACT inhaler Inhale 1-2 puffs into the lungs every 6 (six) hours as needed for wheezing or shortness of breath. Annual appt due w/labs must see provider for future refills 03/14/22  Yes Myrlene Broker, MD  ?amLODipine (NORVASC) 5 MG tablet TAKE 1 TABLET (5 MG TOTAL) BY MOUTH DAILY.Marland Kitchen NEED OFFICE VISIT 11/10/21  Yes Myrlene Broker, MD  ?amoxicillin-clavulanate (AUGMENTIN) 875-125 MG tablet Take 1 tablet by mouth 2 (two) times daily for 7 days. 04/06/22 04/13/22 Yes Dynastee Brummell, Eilleen Kempf, MD  ?B Complex-Folic Acid (B COMPLEX PLUS) TABS Take 1 tablet by mouth daily. 12/16/21  Yes Plotnikov, Georgina Quint, MD  ?buPROPion (WELLBUTRIN XL) 150 MG 24 hr tablet TAKE 1 TABLET BY MOUTH EVERY DAY 02/01/22  Yes Dohmeier, Porfirio Mylar, MD  ?calcium-vitamin D (RA HI-CAL PLUS VITAMIN D) 500-200 MG-UNIT tablet Take 1 tablet by mouth daily with breakfast. 08/27/20  Yes O'Neal, Ronnald Ramp, MD  ?cyclobenzaprine (FLEXERIL) 10 MG tablet Take 1 tablet (10 mg total) by mouth 2 (two) times daily as needed for muscle spasms. 01/05/22  Yes Al Decant,  PA-C  ?D3-1000 25 MCG (1000 UT) capsule Take 1 capsule (1,000 Units total) by mouth daily. Annual appt due w/labs must see provider for future refills 03/14/22  Yes Myrlene Broker, MD  ?Galcanezumab-gnlm [facility]) 120 MG/ML SOSY Inject 120 mg into the skin every 30 (thirty) days. 10/11/21  Yes Lomax, Amy, NP  ?Prenatal Vit-Fe Fumarate-FA (PRENATAL PLUS VITAMIN/MINERAL) 27-1 MG TABS Take 1 tablet by mouth daily with breakfast. 01/07/22  Yes Dohmeier, Porfirio Mylar, MD  ?rizatriptan (MAXALT-MLT) 10 MG disintegrating tablet Take 1 tablet (10 mg total) by mouth as needed for migraine. May repeat in 2 hours if needed 10/11/21  Yes Lomax, Amy, NP  ?venlafaxine XR (EFFEXOR-XR) 150 MG 24 hr capsule Take 1 capsule (150 mg total) by mouth daily with breakfast. 08/26/21  Yes York Spaniel, MD  ?Vitamin D, Ergocalciferol, (DRISDOL) 1.25 MG (50000 UNIT) CAPS capsule TAKE 1 CAPSULE (50,000 UNITS TOTAL) BY MOUTH EVERY 7 (SEVEN) DAYS 01/10/22  Yes Plotnikov, Georgina Quint, MD  ? ? ?Allergies  ?Allergen Reactions  ? Fish Allergy Anaphylaxis and Shortness Of Breath  ? Naproxen Anxiety, Other (See Comments) and Anaphylaxis  ?  Vaginal bleeding and anxiety ?Other reaction(s): Bleeding (intolerance) ?Stomach bleeding ?Vaginal bleeding and anxiety  ? Other Anaphylaxis and Rash  ? Sulfa Antibiotics Anxiety, Other (See Comments) and Anaphylaxis  ?  Vaginal bleeding and anxiety ?Vaginal bleeding and anxiety ?Vaginal bleeding and anxiety  ? Valium [Diazepam] Nausea And Vomiting  ? Asa [Aspirin]   ? Topamax [Topiramate]   ?  Heart racing  ? ? ?Patient Active  Problem List  ? Diagnosis Date Noted  ? Episodic recurrent vertigo 04/06/2022  ? Tobacco dependence due to cigarettes 02/06/2022  ? Inadequate sleep hygiene 02/06/2022  ? Morning headache 02/06/2022  ? Foot pain, bilateral 12/16/2021  ? Snoring 12/16/2021  ? Convulsions (HCC) 03/12/2021  ? Vertigo 11/09/2020  ? Migraine headache with aura 11/09/2020  ? History of COVID-19 09/16/2020  ?  Degenerative arthritis of knee, bilateral 10/07/2019  ? Suspected COVID-19 virus infection 05/07/2019  ? Chronic pain of left ankle 04/13/2018  ? Sinusitis 11/25/2016  ? Vitamin D deficiency 07/28/2016  ? Routine general medical examination at a health care facility 04/25/2016  ? Obesity 04/25/2016  ? Tobacco abuse 04/25/2016  ? Community acquired pneumonia 09/28/2014  ? Anemia 09/28/2014  ? Menorrhagia 09/28/2014  ? Thrombocytopenia (HCC) 09/28/2014  ? CAP (community acquired pneumonia) 09/28/2014  ? ? ?Past Medical History:  ?Diagnosis Date  ? Hypertension   ? Iron deficiency anemia   ? Migraine   ? Migraine headache with aura 11/09/2020  ? Sarcoidosis   ? Vertigo   ? ? ?Past Surgical History:  ?Procedure Laterality Date  ? CESAREAN SECTION    ? CESAREAN SECTION    ? KNEE SURGERY    ? TUBAL LIGATION    ? ? ?Social History  ? ?Socioeconomic History  ? Marital status: Legally Separated  ?  Spouse name: Not on file  ? Number of children: 3  ? Years of education: 57  ? Highest education level: Not on file  ?Occupational History  ? Occupation: unemployed 11/09/20  ? Occupation: disabled  ?Tobacco Use  ? Smoking status: Every Day  ?  Packs/day: 1.00  ?  Years: 17.00  ?  Pack years: 17.00  ?  Types: Cigarettes  ? Smokeless tobacco: Never  ?Vaping Use  ? Vaping Use: Never used  ?Substance and Sexual Activity  ? Alcohol use: Not Currently  ? Drug use: No  ? Sexual activity: Not on file  ?Other Topics Concern  ? Not on file  ?Social History Narrative  ? Lives with son  ? Right Handed  ? Drinks 6-8 cups caffeine daily.  ? ?Social Determinants of Health  ? ?Financial Resource Strain: Not on file  ?Food Insecurity: Not on file  ?Transportation Needs: Not on file  ?Physical Activity: Not on file  ?Stress: Not on file  ?Social Connections: Not on file  ?Intimate Partner Violence: Not on file  ? ? ?Family History  ?Problem Relation Age of Onset  ? Lupus Mother   ? Healthy Father   ? Heart failure Maternal Grandmother   ? Diabetes  Maternal Grandmother   ? Hypertension Maternal Grandmother   ? Thyroid disease Maternal Grandmother   ? Diabetes Maternal Grandfather   ? Diabetes Paternal Grandmother   ? Thyroid disease Sister   ? Anemia Sister   ? ? ? ?Review of Systems  ?Constitutional: Negative.  Negative for chills and fever.  ?HENT:  Positive for congestion and sinus pain.   ?Respiratory:  Positive for cough. Negative for shortness of breath.   ?Cardiovascular: Negative.  Negative for chest pain and palpitations.  ?Gastrointestinal:  Negative for abdominal pain, diarrhea, nausea and vomiting.  ?Skin: Negative.  Negative for rash.  ?Neurological:  Positive for headaches.  ?All other systems reviewed and are negative. ? ?Today's Vitals  ? 04/06/22 1026  ?BP: 132/74  ?Pulse: 75  ?Temp: 98 ?F (36.7 ?C)  ?TempSrc: Oral  ?SpO2: 99%  ?Weight: 196 lb 6 oz (  89.1 kg)  ?Height:  (1.651 m)  ? ?Body mass index is 32.68 kg/m?. ? ?Physical Exam ?Vitals reviewed.  ?Constitutional:   ?   Appearance: She is well-developed.  ?HENT:  ?   Head: Normocephalic.  ?   Right Ear: Tympanic membrane, ear canal and external ear normal.  ?   Left Ear: Tympanic membrane, ear canal and external ear normal.  ?   Nose: Congestion present.  ?   Right Sinus: Maxillary sinus tenderness present.  ?   Left Sinus: Maxillary sinus tenderness present.  ?   Mouth/Throat:  ?   Mouth: Mucous membranes are moist.  ?   Pharynx: Oropharynx is clear.  ?Eyes:  ?   Extraocular Movements: Extraocular movements intact.  ?   Conjunctiva/sclera: Conjunctivae normal.  ?   Pupils: Pupils are equal, round, and reactive to light.  ?Cardiovascular:  ?   Rate and Rhythm: Normal rate and regular rhythm.  ?Pulmonary:  ?   Effort: Pulmonary effort is normal.  ?   Breath sounds: Normal breath sounds.  ?Musculoskeletal:  ?   Cervical back: Normal range of motion and neck supple. No tenderness.  ?   Right lower leg: No edema.  ?   Left lower leg: No edema.  ?Lymphadenopathy:  ?   Cervical: No cervical  adenopathy.  ?Skin: ?   General: Skin is warm and dry.  ?   Capillary Refill: Capillary refill takes less than 2 seconds.  ?Neurological:  ?   General: No focal deficit present.  ?   Mental Status: She is ale

## 2022-05-26 ENCOUNTER — Telehealth: Payer: Self-pay | Admitting: Neurology

## 2022-05-26 MED ORDER — VENLAFAXINE HCL ER 150 MG PO CP24: 150.0000 mg | ORAL_CAPSULE | Freq: Every day | ORAL | 0 refills | Status: DC

## 2022-05-26 MED ORDER — EMGALITY 120 MG/ML ~~LOC~~ SOSY: 120.0000 mg | PREFILLED_SYRINGE | SUBCUTANEOUS | 0 refills | Status: DC

## 2022-05-26 NOTE — Telephone Encounter (Signed)
Pt request refill for Galcanezumab-gnlm ([name]) 120 MG/ML SOSY and venlafaxine XR (EFFEXOR-XR) 150 MG 24 hr capsule at [facility] [phone]

## 2022-05-26 NOTE — Telephone Encounter (Signed)
Refill sent as requested. 

## 2022-07-17 ENCOUNTER — Other Ambulatory Visit: Payer: Self-pay

## 2022-07-17 ENCOUNTER — Other Ambulatory Visit: Payer: Self-pay | Admitting: Neurology

## 2022-07-17 ENCOUNTER — Encounter (HOSPITAL_COMMUNITY): Payer: Self-pay

## 2022-07-17 ENCOUNTER — Emergency Department (HOSPITAL_COMMUNITY)
Admission: EM | Admit: 2022-07-17 | Discharge: 2022-07-18 | Disposition: A | Discharge status: Home / Self Care | Payer: BC Managed Care – PPO | Attending: Emergency Medicine | Admitting: Emergency Medicine

## 2022-07-17 DIAGNOSIS — I10 Essential (primary) hypertension: Secondary | ICD-10-CM | POA: Insufficient documentation

## 2022-07-17 DIAGNOSIS — G43009 Migraine without aura, not intractable, without status migrainosus: Secondary | ICD-10-CM

## 2022-07-17 DIAGNOSIS — Z7951 Long term (current) use of inhaled steroids: Secondary | ICD-10-CM | POA: Diagnosis not present

## 2022-07-17 DIAGNOSIS — H53149 Visual discomfort, unspecified: Secondary | ICD-10-CM | POA: Diagnosis not present

## 2022-07-17 DIAGNOSIS — R112 Nausea with vomiting, unspecified: Secondary | ICD-10-CM | POA: Diagnosis not present

## 2022-07-17 DIAGNOSIS — Z79899 Other long term (current) drug therapy: Secondary | ICD-10-CM | POA: Diagnosis not present

## 2022-07-17 DIAGNOSIS — G43709 Chronic migraine without aura, not intractable, without status migrainosus: Secondary | ICD-10-CM | POA: Diagnosis not present

## 2022-07-17 DIAGNOSIS — R519 Headache, unspecified: Secondary | ICD-10-CM | POA: Diagnosis not present

## 2022-07-17 NOTE — ED Triage Notes (Addendum)
Patient said she cant see out of both of her eyes. She said she has a bad migraine. The right eye is throbbing. Her left eye has been twitching for a couple of months. Patient sensitive to light.

## 2022-07-18 ENCOUNTER — Encounter (HOSPITAL_COMMUNITY): Payer: Self-pay | Admitting: Emergency Medicine

## 2022-07-18 DIAGNOSIS — G43709 Chronic migraine without aura, not intractable, without status migrainosus: Secondary | ICD-10-CM | POA: Diagnosis not present

## 2022-07-18 MED ORDER — SODIUM CHLORIDE 0.9 % IV BOLUS
500.0000 mL | Freq: Once | INTRAVENOUS | Status: AC
  Administered 2022-07-18: 500 mL via INTRAVENOUS

## 2022-07-18 MED ORDER — METOCLOPRAMIDE HCL 5 MG/ML IJ SOLN
10.0000 mg | Freq: Once | INTRAMUSCULAR | Status: AC
  Administered 2022-07-18: 10 mg via INTRAVENOUS
  Filled 2022-07-18: qty 2

## 2022-07-18 MED ORDER — CYCLOBENZAPRINE HCL 10 MG PO TABS
10.0000 mg | ORAL_TABLET | Freq: Once | ORAL | Status: AC
  Administered 2022-07-18: 10 mg via ORAL
  Filled 2022-07-18: qty 1

## 2022-07-18 MED ORDER — DIPHENHYDRAMINE HCL 25 MG PO CAPS
25.0000 mg | ORAL_CAPSULE | Freq: Once | ORAL | Status: AC
  Administered 2022-07-18: 25 mg via ORAL
  Filled 2022-07-18: qty 1

## 2022-07-18 NOTE — ED Provider Notes (Signed)
[facility] DEPT Provider Note   CSN: [id number] Arrival date & time: 07/17/22  2305     History  Chief Complaint  Patient presents with   Migraine    [name] is a 46 y.o. female.  HPI   Patient with medical history including migraines, vertigo, hypertension presents with complaints of a migraine.  Patient states that migraine started yesterday morning upon waking up around 8 AM, states that she has photophobia, right-sided headache, denies neck pain, paresthesias or weakness in the upper or lower extremities, no recent head trauma, not on anticoag's, denies recent URIs no fevers or chills no IV drug use.  She states that she has had associated nausea vomiting, states that headache feels like her normal migraine, she states the she is increased photophobia.    Home Medications Prior to Admission medications   Medication Sig Start Date End Date Taking? Authorizing Provider  albuterol (VENTOLIN HFA) 108 (90 Base) MCG/ACT inhaler Inhale 1-2 puffs into the lungs every 6 (six) hours as needed for wheezing or shortness of breath. Annual appt due w/labs must see provider for future refills 03/14/22   Hoyt Koch, MD  amLODipine (NORVASC) 5 MG tablet TAKE 1 TABLET (5 MG TOTAL) BY MOUTH DAILY.Marland Kitchen NEED OFFICE VISIT 11/10/21   Hoyt Koch, MD  B Complex-Folic Acid (B COMPLEX PLUS) TABS Take 1 tablet by mouth daily. 12/16/21   Plotnikov, Evie Lacks, MD  buPROPion (WELLBUTRIN XL) 150 MG 24 hr tablet TAKE 1 TABLET BY MOUTH EVERY DAY 02/01/22   Dohmeier, Asencion Partridge, MD  calcium-vitamin D (RA HI-CAL PLUS VITAMIN D) 500-200 MG-UNIT tablet Take 1 tablet by mouth daily with breakfast. 08/27/20   O'Neal, Cassie Freer, MD  cyclobenzaprine (FLEXERIL) 10 MG tablet Take 1 tablet (10 mg total) by mouth 2 (two) times daily as needed for muscle spasms. 01/05/22   Azucena Cecil, PA-C  D3-1000 25 MCG (1000 UT) capsule Take 1 capsule (1,000 Units total) by  mouth daily. Annual appt due w/labs must see provider for future refills 03/14/22   Hoyt Koch, MD  Galcanezumab-gnlm [facility]) 120 MG/ML SOSY Inject 120 mg into the skin every 30 (thirty) days. 05/26/22   Dohmeier, Asencion Partridge, MD  Prenatal Vit-Fe Fumarate-FA (PRENATAL PLUS VITAMIN/MINERAL) 27-1 MG TABS Take 1 tablet by mouth daily with breakfast. 01/07/22   Dohmeier, Asencion Partridge, MD  rizatriptan (MAXALT-MLT) 10 MG disintegrating tablet Take 1 tablet (10 mg total) by mouth as needed for migraine. May repeat in 2 hours if needed 10/11/21   Lomax, Amy, NP  venlafaxine XR (EFFEXOR-XR) 150 MG 24 hr capsule Take 1 capsule (150 mg total) by mouth daily with breakfast. Please call and make overdue appt for further refills 05/26/22   Dohmeier, Asencion Partridge, MD  Vitamin D, Ergocalciferol, (DRISDOL) 1.25 MG (50000 UNIT) CAPS capsule TAKE 1 CAPSULE (50,000 UNITS TOTAL) BY MOUTH EVERY 7 (SEVEN) DAYS 01/10/22   Plotnikov, Evie Lacks, MD      Allergies    Fish allergy, Naproxen, Other, Sulfa antibiotics, Valium [diazepam], Asa [aspirin], and Topamax [topiramate]    Review of Systems   Review of Systems  Constitutional:  Negative for chills and fever.  Eyes:  Positive for photophobia.  Respiratory:  Negative for shortness of breath.   Cardiovascular:  Negative for chest pain.  Gastrointestinal:  Positive for nausea and vomiting. Negative for abdominal pain and diarrhea.  Neurological:  Positive for headaches.    Physical Exam Updated Vital Signs BP 130/87   Pulse Marland Kitchen)  59   Temp 98 F (36.7 C) (Oral)   Resp 16   Ht 5\' 5"  (1.651 m)   Wt 86.2 kg   SpO2 99%   BMI 31.62 kg/m  Physical Exam Vitals and nursing note reviewed.  Constitutional:      General: She is not in acute distress.    Appearance: She is not ill-appearing.  HENT:     Head: Normocephalic and atraumatic.     Nose: No congestion.  Eyes:     Extraocular Movements: Extraocular movements intact.     Conjunctiva/sclera: Conjunctivae normal.      Pupils: Pupils are equal, round, and reactive to light.  Cardiovascular:     Rate and Rhythm: Normal rate and regular rhythm.     Pulses: Normal pulses.  Pulmonary:     Effort: Pulmonary effort is normal.  Skin:    General: Skin is warm and dry.  Neurological:     Mental Status: She is alert.     GCS: GCS eye subscore is 4. GCS verbal subscore is 5. GCS motor subscore is 6.     Sensory: Sensation is intact.     Coordination: Romberg sign negative. Finger-Nose-Finger Test normal.     Comments: Cranial nerves II through XII grossly intact no difficulty with word finding following two-step commands no unilateral weakness present.  Psychiatric:        Mood and Affect: Mood normal.     ED Results / Procedures / Treatments   Labs (all labs ordered are listed, but only abnormal results are displayed) Labs Reviewed - No data to display  EKG None  Radiology No results found.  Procedures Procedures    Medications Ordered in ED Medications  metoCLOPramide (REGLAN) injection 10 mg (10 mg Intravenous Given 07/18/22 0448)  sodium chloride 0.9 % bolus 500 mL (0 mLs Intravenous Stopped 07/18/22 0539)  cyclobenzaprine (FLEXERIL) tablet 10 mg (10 mg Oral Given 07/18/22 0456)  diphenhydrAMINE (BENADRYL) capsule 25 mg (25 mg Oral Given 07/18/22 0450)    ED Course/ Medical Decision Making/ A&P                           Medical Decision Making Risk Prescription drug management.   This patient presents to the ED for concern of migraine, this involves an extensive number of treatment options, and is a complaint that carries with it a high risk of complications and morbidity.  The differential diagnosis includes internal head bleed, meningitis, CVA    Additional history obtained:  Additional history obtained from N/A External records from outside source obtained and reviewed including previous neurology notes   Co morbidities that complicate the patient  evaluation  Migraines  Social Determinants of Health:  N/A    Lab Tests:  I Ordered, and personally interpreted labs.  The pertinent results include: N/A   Imaging Studies ordered:  I ordered imaging studies including N/A I independently visualized and interpreted imaging which showed n/a I agree with the radiologist interpretation   Cardiac Monitoring:  The patient was maintained on a cardiac monitor.  I personally viewed and interpreted the cardiac monitored which showed an underlying rhythm of: N/A   Medicines ordered and prescription drug management:  I ordered medication including migraine cocktail I have reviewed the patients home medicines and have made adjustments as needed  Critical Interventions:  N/A   Reevaluation:  Presents with a migraine, benign physical exam, will provide with migraine cocktail and reassess.  Patient is reassessed she is resting comfortably having no complaints migraine has resolved she is ready for discharge.    Consultations Obtained:  N/A    Test Considered:  CT head-this will be deferred as my suspicion for intracranial head bleed is very low at this time, no head trauma not anticoag's no focal deficit on my exam.    Rule out  Low suspicion for CVA she has no focal deficit present my exam.  Low suspicion for dissection of the vertebral or carotid artery as presentation atypical of etiology.  Low suspicion for meningitis as she has no meningeal sign present.     Dispostion and problem list  After consideration of the diagnostic results and the patients response to treatment, I feel that the patent would benefit from discharge.  Migraine-since resolved, we will have her continue with her home medications, follow-up with neurology for further evaluation and strict return precautions.            Final Clinical Impression(s) / ED Diagnoses Final diagnoses:  Migraine without aura and without status  migrainosus, not intractable    Rx / DC Orders ED Discharge Orders     None         Carroll Sage, PA-C 07/18/22 0540    Paula Libra, MD 07/18/22 [phone]

## 2022-07-18 NOTE — Discharge Instructions (Signed)
Exam was reassuring, please continue with all home medications, please remember to stay hydrated decrease on stress drink plenty of fluids this can help decrease frequency of migraines  Recommend that you follow-up with neurology for further management.  Come back to the emergency department if you develop chest pain, shortness of breath, severe abdominal pain, uncontrolled nausea, vomiting, diarrhea.

## 2022-07-18 NOTE — ED Notes (Signed)
Pt ambulatory to restroom

## 2022-07-20 DIAGNOSIS — H209 Unspecified iridocyclitis: Secondary | ICD-10-CM | POA: Diagnosis not present

## 2022-07-28 DIAGNOSIS — H209 Unspecified iridocyclitis: Secondary | ICD-10-CM | POA: Diagnosis not present

## 2022-08-20 ENCOUNTER — Other Ambulatory Visit: Payer: Self-pay | Admitting: Neurology

## 2022-09-13 ENCOUNTER — Other Ambulatory Visit: Payer: Self-pay | Admitting: Family Medicine

## 2022-09-13 ENCOUNTER — Ambulatory Visit (INDEPENDENT_AMBULATORY_CARE_PROVIDER_SITE_OTHER): Payer: BC Managed Care – PPO

## 2022-09-13 ENCOUNTER — Ambulatory Visit (INDEPENDENT_AMBULATORY_CARE_PROVIDER_SITE_OTHER): Payer: BC Managed Care – PPO | Admitting: Family Medicine

## 2022-09-13 VITALS — BP 130/84 | HR 72 | Ht 65.0 in | Wt 198.0 lb

## 2022-09-13 DIAGNOSIS — M545 Low back pain, unspecified: Secondary | ICD-10-CM

## 2022-09-13 DIAGNOSIS — M2550 Pain in unspecified joint: Secondary | ICD-10-CM | POA: Diagnosis not present

## 2022-09-13 DIAGNOSIS — M546 Pain in thoracic spine: Secondary | ICD-10-CM

## 2022-09-13 DIAGNOSIS — G5602 Carpal tunnel syndrome, left upper limb: Secondary | ICD-10-CM

## 2022-09-13 DIAGNOSIS — G8929 Other chronic pain: Secondary | ICD-10-CM

## 2022-09-13 DIAGNOSIS — M542 Cervicalgia: Secondary | ICD-10-CM

## 2022-09-13 DIAGNOSIS — M7062 Trochanteric bursitis, left hip: Secondary | ICD-10-CM

## 2022-09-13 DIAGNOSIS — M549 Dorsalgia, unspecified: Secondary | ICD-10-CM | POA: Diagnosis not present

## 2022-09-13 DIAGNOSIS — M47816 Spondylosis without myelopathy or radiculopathy, lumbar region: Secondary | ICD-10-CM | POA: Diagnosis not present

## 2022-09-13 MED ORDER — PREDNISONE 50 MG PO TABS: ORAL_TABLET | ORAL | 0 refills | Status: DC

## 2022-09-13 NOTE — Patient Instructions (Addendum)
Thank you for coming in today.   Please get labs today before you leave   Please get an Xray today before you leave neck  I've referred you to Physical Therapy.  Let us know if you don't hear from them in one week.   I've sent a prescription for Prednisone to your pharmacy.   Check back in 1 month
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
## 2022-09-15 NOTE — Therapy (Incomplete)
OUTPATIENT PHYSICAL THERAPY THORACOLUMBAR EVALUATION   Patient Name: [name] MRN: [id number] DOB:1976-06-01, 46 y.o., female Today's Date: 09/15/2022    Past Medical History:  Diagnosis Date   Hypertension    Iron deficiency anemia    Migraine    Migraine headache with aura 11/09/2020   Sarcoidosis    Vertigo    Past Surgical History:  Procedure Laterality Date   CESAREAN SECTION     CESAREAN SECTION     KNEE SURGERY     TUBAL LIGATION     Patient Active Problem List   Diagnosis Date Noted   Episodic recurrent vertigo 04/06/2022   Tobacco dependence due to cigarettes 02/06/2022   Inadequate sleep hygiene 02/06/2022   Morning headache 02/06/2022   Foot pain, bilateral 12/16/2021   Snoring 12/16/2021   Convulsions (HCC) 03/12/2021   Vertigo 11/09/2020   Migraine headache with aura 11/09/2020   History of COVID-19 09/16/2020   Degenerative arthritis of knee, bilateral 10/07/2019   Suspected COVID-19 virus infection 05/07/2019   Chronic pain of left ankle 04/13/2018   Sinusitis 11/25/2016   Vitamin D deficiency 07/28/2016   Routine general medical examination at a health care facility 04/25/2016   Obesity 04/25/2016   Tobacco abuse 04/25/2016   Community acquired pneumonia 09/28/2014   Anemia 09/28/2014   Menorrhagia 09/28/2014   Thrombocytopenia (HCC) 09/28/2014    PCP: Myrlene Broker, MD  REFERRING PROVIDER: Rodolph Bong, MD  REFERRING DIAG: [phone] (ICD-10-CM) - Chronic bilateral thoracic back pain M25.50 (ICD-10-CM) - Polyarthralgia  Rationale for Evaluation and Treatment Rehabilitation  THERAPY DIAG:  No diagnosis found.  ONSET DATE: chronic, since ~2016  SUBJECTIVE:                                                                                                                                                                                           SUBJECTIVE STATEMENT: ***  PERTINENT HISTORY:  migraines, convulsions,  recurrent vertigo  PAIN:  Are you having pain: *** Location: *** How would you describe your pain? *** Best in past week: *** Worst in past week: *** Aggravating factors: *** Easing factors: ***   PRECAUTIONS: {Therapy precautions:24002}  WEIGHT BEARING RESTRICTIONS: {Yes ***/No:24003}  FALLS:  Has patient fallen in last 6 months? {fallsyesno:27318}  LIVING ENVIRONMENT: ***  OCCUPATION: ***  PLOF: {PLOF:24004}  PATIENT GOALS: ***   OBJECTIVE:   DIAGNOSTIC FINDINGS:  Per referring MD note 09/13/22: "X-ray images C-spine and L-spine obtained today personally and independently interpreted   C-spine: Some degenerative changes with anterior spur formation at C5-6.  No acute fractures.  Loss of cervical lordosis indicating muscle spasm  is present.  No acute fractures are present.   T-spine: No acute fractures.  No severe degenerative changes.   L-spine: Possible pars defects L5-S1.  Some facet DJD is present at this level.  No anterolisthesis is present.  No acute fractures."  PATIENT SURVEYS:  FOTO ***  SCREENING FOR RED FLAGS: Bowel or bladder incontinence: {Yes/No:304960894} Spinal tumors: {Yes/No:304960894} Cauda equina syndrome: {Yes/No:304960894} Compression fracture: {Yes/No:304960894} Abdominal aneurysm: {Yes/No:304960894}  COGNITION: Overall cognitive status: Within functional limits for tasks assessed     SENSATION: Light touch intact all extremities   POSTURE: {posture:25561}  PALPATION: ***  LUMBAR ROM:   AROM eval  Flexion   Extension   Right lateral flexion   Left lateral flexion   Right rotation   Left rotation    (Blank rows = not tested)  LOWER EXTREMITY ROM:     {AROM/PROM:27142}  Right eval Left eval  Hip flexion    Hip extension    Hip internal rotation    Hip external rotation     (Blank rows = not tested)  Comments:    LOWER EXTREMITY MMT:    MMT Right eval Left eval  Hip flexion    Hip abduction (modified  sitting)    Hip internal rotation    Hip external rotation    Knee flexion    Knee extension     (Blank rows = not tested)  Comments:   LUMBAR SPECIAL TESTS:  {lumbar special test:25242}  FUNCTIONAL TESTS:  {Functional tests:24029}  GAIT: Distance walked: *** Assistive device utilized: {Assistive devices:23999} Level of assistance: {Levels of assistance:24026} Comments: ***  TODAY'S TREATMENT: [facility] Adult PT Treatment:                                  DATE: 09/16/22 Therapeutic Exercise: *** Manual Therapy: ***   PATIENT EDUCATION:  Education details: Pt education on PT impairments, prognosis, and POC. Informed consent. Rationale for interventions, safe/appropriate HEP performance Person educated: {Person educated:25204} Education method: {Education Method:25205} Education comprehension: {Education Comprehension:25206}   HOME EXERCISE PROGRAM: ***  ASSESSMENT:  CLINICAL IMPRESSION: Pt is a 46 year old woman who arrives to PT evaluation on this date for pain in multiple body parts (back, BUE, L hip). Today's assessment focuses on *** as pt reports it is most limiting, will assess further as able/appropriate. Pt reports difficulty with *** due to pain. During today's session pt demonstrates *** which are limiting ability to perform aforementioned activities. Recommend skilled PT to address aforementioned deficits to improve functional independence/tolerance.   OBJECTIVE IMPAIRMENTS: {opptimpairments:25111}.   ACTIVITY LIMITATIONS: {activitylimitations:27494}  PARTICIPATION LIMITATIONS: {participationrestrictions:25113}  PERSONAL FACTORS: {Personal factors:25162} are also affecting patient's functional outcome.   REHAB POTENTIAL: {rehabpotential:25112}  CLINICAL DECISION MAKING: {clinical decision making:25114}  EVALUATION COMPLEXITY: {Evaluation complexity:25115}   GOALS: Goals reviewed with patient? {yes/no:20286}  SHORT TERM GOALS: Target date: {follow  up:25551}  Pt will demonstrate appropriate understanding and performance of initially prescribed HEP in order to facilitate improved independence with management of symptoms.  Baseline: HEP provided on eval Goal status: INITIAL   2. Pt will score greater than or equal to *** on FOTO in order to demonstrate improved perception of function due to symptoms.  Baseline: ***  Goal status: INITIAL   LONG TERM GOALS: Target date: {follow up:25551}  Pt will score *** on FOTO in order to demonstrate improved perception of functional status due to symptoms.  Baseline: *** Goal status: INITIAL  2.  Pt will demonstrate *** lumbar AROM in order to demonstrate improved tolerance to functional movement patterns.   Baseline: *** Goal status: INITIAL  3.  Pt will demonstrate hip MMT of *** in order to demonstrate improved strength for functional movements.  Baseline: *** Goal status: INITIAL  4. Pt will perform 5xSTS in <*** sec in order to demonstrate reduced fall risk and improved functional independence. (MCID of 2.3sec)  Baseline: ***  Goal status: INITIAL   PLAN:  PT FREQUENCY: {rehab frequency:25116}  PT DURATION: {rehab duration:25117}  PLANNED INTERVENTIONS: {rehab planned interventions:25118::"Therapeutic exercises","Therapeutic activity","Neuromuscular re-education","Balance training","Gait training","Patient/Family education","Self Care","Joint mobilization"}.  PLAN FOR NEXT SESSION: ***   Ashley Murrain PT, DPT 09/15/2022 9:19 AM

## 2022-09-16 ENCOUNTER — Ambulatory Visit: Payer: BC Managed Care – PPO | Admitting: Physical Therapy

## 2022-09-16 ENCOUNTER — Other Ambulatory Visit (HOSPITAL_COMMUNITY): Payer: Self-pay

## 2022-09-16 ENCOUNTER — Other Ambulatory Visit: Payer: Self-pay | Admitting: Neurology

## 2022-09-16 ENCOUNTER — Other Ambulatory Visit: Payer: Self-pay | Admitting: Internal Medicine

## 2022-09-16 LAB — ANTI-NUCLEAR AB-TITER (ANA TITER): ANA Titer 1: 1:1280 {titer} — ABNORMAL HIGH

## 2022-09-16 LAB — CYCLIC CITRUL PEPTIDE ANTIBODY, IGG: Cyclic Citrullin Peptide Ab: <16 UNITS

## 2022-09-16 LAB — EXTRA LAV TOP TUBE

## 2022-09-16 LAB — HLA-B27 ANTIGEN: HLA-B27 Antigen: NEGATIVE

## 2022-09-16 LAB — RHEUMATOID FACTOR: Rheumatoid fact SerPl-aCnc: <14 IU/mL (ref <14)

## 2022-09-16 LAB — ANA: Anti Nuclear Antibody (ANA): POSITIVE — AB

## 2022-09-16 MED ORDER — PRENATAL PLUS VITAMIN/MINERAL 27-1 MG PO TABS: 1.0000 | ORAL_TABLET | Freq: Every day | ORAL | Status: DC

## 2022-09-16 NOTE — Progress Notes (Signed)
Thoracic spine x-ray looks normal to radiology.

## 2022-09-16 NOTE — Progress Notes (Signed)
Cervical spine x-ray looks normal to radiology.

## 2022-09-16 NOTE — Progress Notes (Signed)
Lumbar spine x-ray shows a little bit of arthritis changes in the base of the spine.

## 2022-09-19 NOTE — Addendum Note (Signed)
Addended by: Gregor Hams on: 09/19/2022 07:03 AM   Modules accepted: Orders

## 2022-09-19 NOTE — Progress Notes (Signed)
Your ANA level is very positive.  This makes it very likely that you do have some autoimmune inflammatory conditions such as lupus.  I am referring you to a rheumatologist to help figure this out.  You should hear from [facility] rheumatology soon.  Please let me know if they do not contact you within a week.

## 2022-09-22 DIAGNOSIS — M2550 Pain in unspecified joint: Secondary | ICD-10-CM | POA: Diagnosis not present

## 2022-09-22 DIAGNOSIS — M791 Myalgia, unspecified site: Secondary | ICD-10-CM | POA: Diagnosis not present

## 2022-09-22 DIAGNOSIS — R768 Other specified abnormal immunological findings in serum: Secondary | ICD-10-CM | POA: Diagnosis not present

## 2022-09-22 DIAGNOSIS — M1990 Unspecified osteoarthritis, unspecified site: Secondary | ICD-10-CM | POA: Diagnosis not present

## 2022-09-27 ENCOUNTER — Other Ambulatory Visit: Payer: Self-pay | Admitting: Neurology

## 2022-09-27 ENCOUNTER — Other Ambulatory Visit: Payer: Self-pay | Admitting: Internal Medicine

## 2022-09-27 ENCOUNTER — Encounter: Payer: Self-pay | Admitting: Physical Therapy

## 2022-09-27 ENCOUNTER — Ambulatory Visit: Payer: BC Managed Care – PPO | Attending: Family Medicine | Admitting: Physical Therapy

## 2022-09-27 ENCOUNTER — Other Ambulatory Visit: Payer: Self-pay

## 2022-09-27 DIAGNOSIS — M5440 Lumbago with sciatica, unspecified side: Secondary | ICD-10-CM | POA: Insufficient documentation

## 2022-09-27 DIAGNOSIS — M546 Pain in thoracic spine: Secondary | ICD-10-CM | POA: Insufficient documentation

## 2022-09-27 DIAGNOSIS — G8929 Other chronic pain: Secondary | ICD-10-CM | POA: Diagnosis not present

## 2022-09-27 DIAGNOSIS — M542 Cervicalgia: Secondary | ICD-10-CM | POA: Insufficient documentation

## 2022-09-27 DIAGNOSIS — R2681 Unsteadiness on feet: Secondary | ICD-10-CM | POA: Diagnosis not present

## 2022-09-27 DIAGNOSIS — M6281 Muscle weakness (generalized): Secondary | ICD-10-CM | POA: Diagnosis not present

## 2022-09-27 NOTE — Patient Instructions (Signed)
Aquatic Therapy at [facility]-  What to Expect!  Where:   [facility] [place], [place] [address] Rehab phone [phone]  NOTE:  You will receive an automated phone message reminding you of your appt and it will say the appointment is at the [facility] clinic.          How to Prepare: Please make sure you drink 8 ounces of water about one hour prior to your pool session A caregiver may attend if needed with the patient to help assist as needed. A caregiver can sit in the pool room on chair. Please arrive IN YOUR SUIT and 15 minutes prior to your appointment - this helps to avoid delays in starting your session. Please make sure to attend to any toileting needs prior to entering the pool Bowie rooms for changing are provided.   There is direct access to the pool deck form the locker room.  You can lock your belongings in a locker with lock provided. Once on the pool deck your therapist will ask if you have signed the Patient  Consent and Assignment of Benefits form before beginning treatment Your therapist may take your blood pressure prior to, during and after your session if indicated We usually try and create a home exercise program based on activities we do in the pool.  Please be thinking about who might be able to assist you in the pool should you need to participate in an aquatic home exercise program at the time of discharge if you need assistance.  Some patients do not want to or do not have the ability to participate in an aquatic home program - this is not a barrier in any way to you participating in aquatic therapy as part of your current therapy plan! After Discharge from PT, you can continue using home program at  the [facility], there is a drop-in fee for $5 ($45 a month)or for 60 years  or older $4.00 ($40 a month for seniors ) or any local [facility] pool.  Memberships for purchase are  available for gym/pool at [facility] LAST AQUATIC VISIT.  PLEASE MAKE SURE THAT YOU HAVE A [address]  APPOINTMENT SCHEDULED.   About the pool: Pool is located approximately 500 FT from the entrance of the building.  Please bring a support person if you need assistance traveling this      distance.   Your therapist will assist you in entering the water; there are two ways to           enter: stairs with railings, and a mechanical lift. Your therapist will determine the most appropriate way for you.  Water temperature is usually between 88-90 degrees  There may be up to 2 other swimmers in the pool at the same time  The pool deck is tile, please wear shoes with good traction if you prefer not to be barefoot.    Contact Info:  For appointment scheduling and cancellations:         Please call the [facility]       All sessions are 45 minutes  Voncille Lo, PT, [place] Certified Exercise Expert for the Aging Adult  09/27/22 11:48 AM Phone: [phone] Fax: [phone]

## 2022-09-27 NOTE — Therapy (Signed)
OUTPATIENT PHYSICAL THERAPY  Cervical/THORACOLUMBAR EVALUATION   Patient Name: [name] MRN: [id number] DOB:June 28, 1976, 46 y.o., female Today's Date: 09/27/2022   PT End of Session - 09/27/22 1304     Visit Number 1    Number of Visits 16    Date for PT Re-Evaluation 11/22/22    Authorization Type BCBS/MCD 2nd    PT Start Time 1110    PT Stop Time 1145    PT Time Calculation (min) 35 min    Activity Tolerance Patient limited by pain    Behavior During Therapy [facility] for tasks assessed/performed             Past Medical History:  Diagnosis Date   Hypertension    Iron deficiency anemia    Migraine    Migraine headache with aura 11/09/2020   Sarcoidosis    Vertigo    Past Surgical History:  Procedure Laterality Date   CESAREAN SECTION     CESAREAN SECTION     KNEE SURGERY     TUBAL LIGATION     Patient Active Problem List   Diagnosis Date Noted   Episodic recurrent vertigo 04/06/2022   Tobacco dependence due to cigarettes 02/06/2022   Inadequate sleep hygiene 02/06/2022   Morning headache 02/06/2022   Foot pain, bilateral 12/16/2021   Snoring 12/16/2021   Convulsions (HCC) 03/12/2021   Vertigo 11/09/2020   Migraine headache with aura 11/09/2020   History of COVID-19 09/16/2020   Degenerative arthritis of knee, bilateral 10/07/2019   Suspected COVID-19 virus infection 05/07/2019   Chronic pain of left ankle 04/13/2018   Sinusitis 11/25/2016   Vitamin D deficiency 07/28/2016   Routine general medical examination at a health care facility 04/25/2016   Obesity 04/25/2016   Tobacco abuse 04/25/2016   Community acquired pneumonia 09/28/2014   Anemia 09/28/2014   Menorrhagia 09/28/2014   Thrombocytopenia (HCC) 09/28/2014    PCP: Myrlene Broker, MD   REFERRING PROVIDER: Rodolph Bong, MD   REFERRING DIAG:  [phone] (ICD-10-CM) - Chronic bilateral thoracic back pain  M25.50 (ICD-10-CM) - Polyarthralgia    Rationale for Evaluation and  Treatment: Rehabilitation  THERAPY DIAG:  Cervicalgia  Pain in thoracic spine  Chronic bilateral low back pain with sciatica, sciatica laterality unspecified  Muscle weakness (generalized)  Unsteadiness on feet  ONSET DATE: Several years according to pt  SUBJECTIVE:                                                                                                                                                                                           SUBJECTIVE STATEMENT: 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   The PT for vestibular did not help.  I am here to work on pain control in my neck , back and Left arm with tingling all the time. I have all over body pain.  I saw a rheumatologist last week and I have not returned to hear results.  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 Yes: NPRS scale: neck at rest 8/10 and at worst 10/  back pain is 8/10 at rest and 10/10 with movement/10 Pain location: Neck, back , all over body pain Pain description: All over body pain throbbing pulsating , nagging Aggravating factors: getting up and down from chairs, stairs, Pt does not drive/license due to medications etc. Household chore standing for 10-15 min,  sit for 15-30 min , I try  to walk but for not longer than 10 min Relieving factors: none  PRECAUTIONS:  High: Fish Allergy; Naproxen; Other; Sulfa Antibiotics; Valium [Diazepam]  Not Specified: Asa [Aspirin]; Topamax [Topiramate]    WEIGHT BEARING RESTRICTIONS: No  FALLS:  Has patient fallen in last 6 months? Yes. Number of falls 3-4 due to dizziness and my legs " giving out " when going to restroom  LIVING ENVIRONMENT: Lives with: lives with their family and lives with their son Lives in: House/apartment Stairs: No Has following equipment at  home: Single point cane and shower chair  OCCUPATION: unemployed  PLOF: Independent  PATIENT GOALS: Pain under control, get stronger and return to some kind of work  NEXT MD VISIT:   OBJECTIVE:   DIAGNOSTIC FINDINGS: 09-13-22 here is no evidence of lumbar spine fracture. Alignment is normal. Intervertebral disc spaces are maintained. Early degenerative change of the endplates at L4-5  FINDINGS: There is no evidence of cervical spine fracture or prevertebral soft tissue swelling. Alignment is normal. No other significant bone abnormalities are identified.   IMPRESSION: Negative cervical spine radiographs   IMPRESSION: Early degenerative change of the endplates at L4-5. FINDINGS: There is no evidence of thoracic spine fracture. Alignment is normal. No other significant bone abnormalities are identified.   IMPRESSION: Negative.  PATIENT SURVEYS:  FOTO 35%  predicted 47%  SCREENING FOR RED FLAGS: Bowel or bladder incontinence: No  COGNITION: Overall cognitive status: Within functional limits for tasks assessed     SENSATION: WFL Except for left UE with tingling down left arm MUSCLE LENGTH: Hamstrings: Right 70 deg; Left 69 deg   POSTURE: rounded shoulders, forward head, and anterior pelvic tilt obesity PALPATION: Globally tender with neck.thoracic and back pain CERVICAL ROM:   Active ROM A/PROM (deg) eval  Flexion 40 pain  Extension 52 pain  Right lateral flexion 25  Left lateral flexion 25  Right rotation 50  Left rotation 39   (Blank rows = not tested)  C Grip strength  R 75lb, dominant  L 35 lb UE WNL bil  LUMBAR ROM:  pain with all motions  AROM eval  Flexion 50%  Extension 10%  Right lateral flexion 50%  Left lateral flexion 50%  Right rotation 50%  Left rotation 50%   (Blank rows = not tested)  LOWER EXTREMITY ROM:   Pain with movement globally  Active  Right eval Left eval  Hip flexion 70 75  Hip extension    Hip abduction     Hip adduction  Hip internal rotation    Hip external rotation    Knee flexion 121 119  Knee extension    Ankle dorsiflexion    Ankle plantarflexion    Ankle inversion    Ankle eversion     (Blank rows = not tested)  LOWER EXTREMITY MMT:    MMT Right eval Left eval  Hip flexion 4- 4-  Hip extension 3+ 3+  Hip abduction    Hip adduction 4- 4-  Hip internal rotation    Hip external rotation    Knee flexion 4 4  Knee extension 4 4  Ankle dorsiflexion    Ankle plantarflexion 10/25 8/25  Ankle inversion    Ankle eversion     (Blank rows = not tested)  LUMBAR SPECIAL TESTS:  Straight leg raise test: Negative and Slump test: Negative  FUNCTIONAL TESTS:  5 times sit to stand: 34.64 2 minute walk test: TBD BERG for balance  GAIT: Distance walked: 170 ft Assistive device utilized: None Level of assistance: Complete Independence Comments: Pt has reported falls  TODAY'S TREATMENT:                                                                                                                              DATE: 09-27-22   Pt not treated came late to evaluation.  Eval complete    PATIENT EDUCATION:  Education details: POC Explanation of findings, intro to [employer].  FOTO taken Person educated: Patient Education method: Explanation, Verbal cues, and Handouts Education comprehension: verbalized understanding and needs further education  HOME EXERCISE PROGRAM: TBD  ASSESSMENT:  CLINICAL IMPRESSION: Patient is a 46 y.o. female who was seen today for physical therapy evaluation and treatment for thoracic , cervical and back/globally polyathralgia.  Pt  with pain 8/10 and at worst 10/10.  Pt has limitations with performing household chores, standing and sitting for longer than 15 min. Pt with increased time needed for STS.  Pt is currently now exercising routinely and has all over body aches and pain and global pain and tightness.   Pt also has grip weakness of L compared to R   L 35 L/ 70 R. Pt reports 3-4 falls in last 6 months and reports feeling unsteady on feet due to weakness and vertigo.  Pt has seen PT for vertigo with limited correction. Pt will benefit from skilled PT to address impairments and increase QOL.   OBJECTIVE IMPAIRMENTS: decreased activity tolerance, decreased balance, difficulty walking, decreased ROM, decreased strength, impaired flexibility, impaired UE functional use, improper body mechanics, obesity, and pain.   ACTIVITY LIMITATIONS: carrying, lifting, sitting, standing, squatting, sleeping, stairs, transfers, reach over head, and caring for others  PARTICIPATION LIMITATIONS: meal prep, cleaning, and laundry  PERSONAL FACTORS: Asthma, Sarcoidosis, R athroscopic meniscus surgery 2009, migraines, HTN, C-sections x 2 are also affecting patient's functional outcome.   REHAB POTENTIAL: Good  CLINICAL DECISION MAKING: Evolving/moderate complexity  EVALUATION COMPLEXITY: Moderate   GOALS: Goals reviewed  with patient? Yes  SHORT TERM GOALS: Target date: 10/25/2022  Pt will be independent with initial HEP Baseline:no knowledge Goal status: INITIAL  2.  Pt will perform BERG and make LTG Baseline:  Goal status: INITIAL  3.  Pt will be educated on pain management strategies and ADL/body mechanics and demonstrated proper lifting.  Baseline:  Goal status: INITIAL  4.  Pt will negotiate steps with safe and correct technique rather than avoiding in community Baseline:  Avoids stairs Goal status: INITIAL   LONG TERM GOALS: Target date: 11/22/2022  Pt will be independent with advanced HEP Baseline: no knowledge Goal status: INITIAL  2.  Pt will be able to sit and watch TV show for an hour with pain managed to 4/10 or less Baseline: Pt can only sit 15 -30 min tolerance pain 8/10 to 10/10 Goal status: INITIAL  3.  Pt will be able to lift 25 lb of groceries from floor to counter without pain greater than 4/10 Baseline: unable to  carry/lift  any weight without pain Goal status: INITIAL  4.  FOTO will improve from  35%  to   47%  indicating improved functional mobility.  Baseline: eval 35% Goal status: INITIAL  5.   Pt will self report >/= 50% decrease in pain from evaluation Evaluation/Baseline (11/22/22): 5/10 max pain Baseline: 10/10 with movement/ 8/10 pain at rest globally Goal status: INITIAL  6.  BERG balance will be at least 52/56 from baseline Baseline:  Goal status: INITIAL  PLAN:  PT FREQUENCY: 1-2x/week  PT DURATION: 8 weeks  PLANNED INTERVENTIONS: Therapeutic exercises, Therapeutic activity, Neuromuscular re-education, Balance training, Gait training, Patient/Family education, Self Care, Joint mobilization, Stair training, Vestibular training, Aquatic Therapy, Dry Needling, Electrical stimulation, Spinal mobilization, Cryotherapy, Moist heat, Taping, Ionotophoresis 4mg /ml Dexamethasone, Manual therapy, and Re-evaluation.  PLAN FOR NEXT SESSION: issue HEP and discuss expectations of aquatic therapy   Voncille Lo, PT, [place] Certified Exercise Expert for the Aging Adult  09/27/22 2:44 PM Phone: [phone] Fax: [phone]

## 2022-09-28 ENCOUNTER — Other Ambulatory Visit (HOSPITAL_COMMUNITY): Payer: Self-pay

## 2022-09-29 ENCOUNTER — Other Ambulatory Visit (HOSPITAL_COMMUNITY): Payer: Self-pay

## 2022-09-29 ENCOUNTER — Encounter (HOSPITAL_COMMUNITY): Payer: Self-pay

## 2022-10-05 ENCOUNTER — Ambulatory Visit: Payer: BC Managed Care – PPO | Admitting: Physical Therapy

## 2022-10-10 ENCOUNTER — Other Ambulatory Visit (HOSPITAL_COMMUNITY): Payer: Self-pay

## 2022-10-12 ENCOUNTER — Ambulatory Visit: Payer: BC Managed Care – PPO | Admitting: Family Medicine

## 2022-10-12 NOTE — Progress Notes (Deleted)
   I, [name], LAT, ATC acting as a scribe for Carrie Graham, MD.  [name] is a 46 y.o. female who presents to [facility] today for f/u polyarthralgia; neck, low back, and L hip pain. Pt was last seen by Dr. Denyse Amass on 09/13/22 and rheum labs were very positive and she was referred to [facility]. Pt was also advised to wear a wrist brace and was referred to PT, completing 1 visit. Today, pt reports  Dx testing: 09/13/22 C-spine, T-spine, & L-spine XR and Labs  Pertinent review of systems: ***  Relevant historical information: ***   Exam:  There were no vitals taken for this visit. General: Well Developed, well nourished, and in no acute distress.   MSK: ***    Lab and Radiology Results No results found for this or any previous visit (from the past 72 hour(s)). No results found.     Assessment and Plan: 46 y.o. female with ***   PDMP not reviewed this encounter. No orders of the defined types were placed in this encounter.  No orders of the defined types were placed in this encounter.    Discussed warning signs or symptoms. Please see discharge instructions. Patient expresses understanding.   ***

## 2022-10-16 ENCOUNTER — Encounter: Payer: Self-pay | Admitting: Internal Medicine

## 2022-10-16 NOTE — Patient Instructions (Addendum)
    Do warm sitz baths.  You can apply lidocaine gel which is over the counter.    Medications changes include :       A referral was ordered for XXX.     Someone will call you to schedule an appointment.    Return if symptoms worsen or fail to improve.

## 2022-10-16 NOTE — Progress Notes (Signed)
    Subjective:    Patient ID: [name], female    DOB: 1976-09-21, 46 y.o.   MRN: [id number]      HPI [name] is here for No chief complaint on file.     Medications and allergies reviewed with patient and updated if appropriate.  Current Outpatient Medications on File Prior to Visit  Medication Sig Dispense Refill   albuterol (VENTOLIN HFA) 108 (90 Base) MCG/ACT inhaler Inhale 1-2 puffs into the lungs every 6 (six) hours as needed for wheezing or shortness of breath. 6.7 g 0   amLODipine (NORVASC) 5 MG tablet TAKE 1 TABLET (5 MG TOTAL) BY MOUTH DAILY.Marland Kitchen NEED OFFICE VISIT 90 tablet 3   B Complex-Folic Acid (B COMPLEX PLUS) TABS Take 1 tablet by mouth daily. 100 tablet 3   buPROPion (WELLBUTRIN XL) 150 MG 24 hr tablet TAKE 1 TABLET BY MOUTH EVERY DAY (Patient not taking: Reported on 09/27/2022) 90 tablet 1   calcium-vitamin D (RA HI-CAL PLUS VITAMIN D) 500-200 MG-UNIT tablet Take 1 tablet by mouth daily with breakfast. 90 tablet 1   D3-1000 25 MCG (1000 UT) capsule Take 1 capsule by mouth daily. Annual appt due w/labs must see provider for future refills 30 capsule 0   [name] 120 MG/ML SOSY INJECT 120 MG INTO THE SKIN EVERY 30 (THIRTY) DAYS. 120 mL 0   predniSONE (DELTASONE) 50 MG tablet Take 1 pill daily for 5 days 5 tablet 0   Prenatal Vit-Fe Fumarate-FA (PRENATAL PLUS VITAMIN/MINERAL) 27-1 MG TABS Take 1 tablet by mouth daily with breakfast. (Patient not taking: Reported on 09/27/2022)     rizatriptan (MAXALT-MLT) 10 MG disintegrating tablet Take 1 tablet (10 mg total) by mouth as needed for migraine. May repeat in 2 hours if needed 9 tablet 11   venlafaxine XR (EFFEXOR-XR) 150 MG 24 hr capsule Take 1 capsule (150 mg total) by mouth daily with breakfast. Please call and make overdue appt for further refills 90 capsule 0   Vitamin D, Ergocalciferol, (DRISDOL) 1.25 MG (50000 UNIT) CAPS capsule TAKE 1 CAPSULE (50,000 UNITS TOTAL) BY MOUTH EVERY 7 (SEVEN) DAYS 4 capsule 0   No  current facility-administered medications on file prior to visit.    Review of Systems     Objective:  There were no vitals filed for this visit. BP Readings from Last 3 Encounters:  09/13/22 130/84  07/18/22 (!) 129/93  04/06/22 132/74   Wt Readings from Last 3 Encounters:  09/13/22 198 lb (89.8 kg)  07/17/22 190 lb (86.2 kg)  04/06/22 196 lb 6 oz (89.1 kg)   There is no height or weight on file to calculate BMI.    Physical Exam         Assessment & Plan:    See Problem List for Assessment and Plan of chronic medical problems.     This encounter was created in error - please disregard.

## 2022-10-17 ENCOUNTER — Encounter: Payer: BC Managed Care – PPO | Admitting: Internal Medicine

## 2022-10-17 NOTE — Telephone Encounter (Signed)
FYI for Dr Denyse Amass.

## 2022-10-18 MED ORDER — NITROGLYCERIN 0.4 % RE OINT: TOPICAL_OINTMENT | RECTAL | 0 refills | Status: DC

## 2022-10-19 ENCOUNTER — Ambulatory Visit (INDEPENDENT_AMBULATORY_CARE_PROVIDER_SITE_OTHER): Payer: BC Managed Care – PPO | Admitting: Family Medicine

## 2022-10-19 ENCOUNTER — Ambulatory Visit: Payer: BC Managed Care – PPO | Admitting: Physical Therapy

## 2022-10-19 ENCOUNTER — Encounter: Payer: Self-pay | Admitting: *Deleted

## 2022-10-19 VITALS — BP 140/90 | HR 73 | Ht 65.0 in

## 2022-10-19 DIAGNOSIS — M5416 Radiculopathy, lumbar region: Secondary | ICD-10-CM | POA: Diagnosis not present

## 2022-10-19 DIAGNOSIS — M2550 Pain in unspecified joint: Secondary | ICD-10-CM | POA: Diagnosis not present

## 2022-10-19 DIAGNOSIS — R768 Other specified abnormal immunological findings in serum: Secondary | ICD-10-CM | POA: Diagnosis not present

## 2022-10-19 NOTE — Patient Instructions (Addendum)
Thank you for coming in today.   You should hear from MRI scheduling within 1 week. If you do not hear please let me know.    Anticipate injection following the MRI for the leg pain.   I hope that the other pain get knocked down a lot with rheumatology and we can work on what's left  You should hear from MRI scheduling within 1 week. If you do not hear please let me know.    Recheck in 1 month.
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
## 2022-10-23 ENCOUNTER — Other Ambulatory Visit: Payer: BC Managed Care – PPO

## 2022-10-26 ENCOUNTER — Ambulatory Visit: Payer: BC Managed Care – PPO | Admitting: Physical Therapy

## 2022-10-26 ENCOUNTER — Telehealth: Payer: Self-pay | Admitting: Physical Therapy
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
## 2022-10-26 NOTE — Telephone Encounter (Signed)
Patient calling to see what she can put on her are states she is having pain.

## 2022-10-27 ENCOUNTER — Ambulatory Visit: Payer: BC Managed Care – PPO | Attending: Family Medicine | Admitting: Physical Therapy

## 2022-10-27 ENCOUNTER — Encounter: Payer: Self-pay | Admitting: Physical Therapy

## 2022-10-27 ENCOUNTER — Other Ambulatory Visit: Payer: Self-pay

## 2022-10-27 DIAGNOSIS — R2681 Unsteadiness on feet: Secondary | ICD-10-CM | POA: Insufficient documentation

## 2022-10-27 DIAGNOSIS — M5440 Lumbago with sciatica, unspecified side: Secondary | ICD-10-CM | POA: Diagnosis not present

## 2022-10-27 DIAGNOSIS — M542 Cervicalgia: Secondary | ICD-10-CM | POA: Insufficient documentation

## 2022-10-27 DIAGNOSIS — G8929 Other chronic pain: Secondary | ICD-10-CM | POA: Diagnosis not present

## 2022-10-27 DIAGNOSIS — M6281 Muscle weakness (generalized): Secondary | ICD-10-CM | POA: Diagnosis not present

## 2022-10-27 DIAGNOSIS — M546 Pain in thoracic spine: Secondary | ICD-10-CM | POA: Diagnosis not present

## 2022-10-27 NOTE — Therapy (Addendum)
OUTPATIENT PHYSICAL THERAPY TREATMENT NOTE  DISCHARGE     Patient Name: [name] MRN: [id number] DOB:Sep 02, 1976, 46 y.o., female Today's Date: 10/27/2022  PCP: Myrlene Broker, MD    REFERRING PROVIDER: Rodolph Bong, MD    END OF SESSION:   PT End of Session - 10/27/22 1228     Visit Number 2    Number of Visits 16    Date for PT Re-Evaluation 11/22/22    Authorization Type BC[employer]/MCD 2nd    PT Start Time 1218    PT Stop Time 1257    PT Time Calculation (min) 39 min    Activity Tolerance Patient limited by pain    Behavior During Therapy [facility] for tasks assessed/performed             Past Medical History:  Diagnosis Date   Hypertension    Iron deficiency anemia    Migraine    Migraine headache with aura 11/09/2020   Sarcoidosis    Vertigo    Past Surgical History:  Procedure Laterality Date   CESAREAN SECTION     CESAREAN SECTION     KNEE SURGERY Right    ACL   TUBAL LIGATION     Patient Active Problem List   Diagnosis Date Noted   Episodic recurrent vertigo 04/06/2022   Tobacco dependence due to cigarettes 02/06/2022   Inadequate sleep hygiene 02/06/2022   Morning headache 02/06/2022   Foot pain, bilateral 12/16/2021   Snoring 12/16/2021   Convulsions (HCC) 03/12/2021   Vertigo 11/09/2020   Migraine headache with aura 11/09/2020   History of COVID-19 09/16/2020   Degenerative arthritis of knee, bilateral 10/07/2019   Suspected COVID-19 virus infection 05/07/2019   Chronic pain of left ankle 04/13/2018   Sinusitis 11/25/2016   Vitamin D deficiency 07/28/2016   Routine general medical examination at a health care facility 04/25/2016   Obesity 04/25/2016   Tobacco abuse 04/25/2016   Community acquired pneumonia 09/28/2014   Anemia 09/28/2014   Menorrhagia 09/28/2014   Thrombocytopenia (HCC) 09/28/2014    REFERRING DIAG:  M54.6,G89.29 (ICD-10-CM) - Chronic bilateral thoracic back pain  M25.50 (ICD-10-CM) - Polyarthralgia     THERAPY DIAG:  Cervicalgia  Pain in thoracic spine  Chronic bilateral low back pain with sciatica, sciatica laterality unspecified  Muscle weakness (generalized)  Unsteadiness on feet  Rationale for Evaluation and Treatment Rehabilitation  PERTINENT HISTORY: Asthma, Sarcoidosis, R athroscopic meniscus surgery 2009, migraines, HTN, C-sections x 2   PRECAUTIONS: Fall   SUBJECTIVE:  SUBJECTIVE STATEMENT:  Patient reports severe neck and back pain, and pain in left arm that has been horrible. She is scheduled for an MRI on 10/29/22.  PAIN:  Are you having pain? Yes:  NPRS scale: neck at rest 8/10 and at worst 10/  back pain is 8/10 at rest and 10/10 with movement Pain location: Neck, back , all over body pain Pain description: All over body pain throbbing pulsating , nagging Aggravating factors: getting up and down from chairs, stairs, Pt does not drive/license due to medications etc. Household chore standing for 10-15 min,  sit for 15-30 min , I try  to walk but for not longer than 10 min Relieving factors: none   OBJECTIVE: (objective measures completed at initial evaluation unless otherwise dated) PATIENT SURVEYS:  FOTO 35%  predicted 47%        SENSATION: WFL Except for left UE with tingling down left arm  MUSCLE LENGTH: Hamstrings: Right 70 deg; Left 69 deg   POSTURE:  Rounded shoulders, forward head, and anterior pelvic tilt obesity  PALPATION: Globally tender with neck.thoracic and back pain  CERVICAL ROM:    Active ROM A/PROM (deg) eval  Flexion 40 pain  Extension 52 pain  Right lateral flexion 25  Left lateral flexion 25  Right rotation 50  Left rotation 39   (Blank rows = not tested)   Grip strength  R 75lb, dominant  L 35 lb UE WNL bil   LUMBAR ROM:  pain with all  motions   AROM eval  Flexion 50%  Extension 10%  Right lateral flexion 50%  Left lateral flexion 50%  Right rotation 50%  Left rotation 50%   (Blank rows = not tested)   LOWER EXTREMITY ROM:   Pain with movement globally   Active  Right eval Left eval  Hip flexion 70 75  Hip extension      Hip abduction      Hip adduction      Hip internal rotation      Hip external rotation      Knee flexion 121 119  Knee extension      Ankle dorsiflexion      Ankle plantarflexion      Ankle inversion      Ankle eversion       (Blank rows = not tested)   LOWER EXTREMITY MMT:     MMT Right eval Left eval Rt / Lt 10/27/2022  Hip flexion 4- 4-   Hip extension 3+ 3+   Hip abduction     3 / 3  Hip adduction 4- 4-   Hip internal rotation       Hip external rotation       Knee flexion 4 4   Knee extension 4 4   Ankle dorsiflexion       Ankle plantarflexion 10/25 8/25   Ankle inversion       Ankle eversion        (Blank rows = not tested)   LUMBAR SPECIAL TESTS:  Straight leg raise test: Negative and Slump test: Negative   FUNCTIONAL TESTS:  5 times sit to stand: 34.64 2 minute walk test: TBD BERG for balance   GAIT: Distance walked: 170 ft Assistive device utilized: None Level of assistance: Complete Independence Comments: Pt has reported falls   TODAY'S TREATMENT:  [facility] Adult PT Treatment:                                                DATE: 10/27/2022 Therapeutic Exercise: NuStep L4 x 5 min with UE/LE while taking subjective Seated physioball roll out lumbar flexion stretch x 5 forward, 3 lateral each LTR x 10 Piriformis stretch 2 x 20 sec each Modified thomas stretch 2 x 1 min each PPT 10 x 5 sec Bridge 2 x 10 Side clamshell 2 x 10 Side thoracic rotation x 5 each                                        PATIENT EDUCATION:  Education details: HEP update Person educated: Patient Education method: Explanation, Verbal cues,  and Handouts Education comprehension: verbalized understanding and needs further education   HOME EXERCISE PROGRAM: Access Code: [email or web address]     ASSESSMENT: CLINICAL IMPRESSION: Patient with fair tolerance for therapy this visit. Therapy focused on initiating HEP in order to improve spinal mobility and strengthening, primarily of lumbar and hip region. She continues to exhibit gross hip strength deficits. She was able to complete all exercises despite increases in pain. She require cueing for proper pelvic tilt technique and core muscular activation. Provided patient with an HEP. She would benefit from continued skilled to progress her mobility and strength in order to reduce pain and maximize functional ability.    OBJECTIVE IMPAIRMENTS: decreased activity tolerance, decreased balance, difficulty walking, decreased ROM, decreased strength, impaired flexibility, impaired UE functional use, improper body mechanics, obesity, and pain.    ACTIVITY LIMITATIONS: carrying, lifting, sitting, standing, squatting, sleeping, stairs, transfers, reach over head, and caring for others   PARTICIPATION LIMITATIONS: meal prep, cleaning, and laundry   PERSONAL FACTORS: Asthma, Sarcoidosis, R athroscopic meniscus surgery 2009, migraines, HTN, C-sections x 2 are also affecting patient's functional outcome.      GOALS: Goals reviewed with patient? Yes   SHORT TERM GOALS: Target date: 10/25/2022   Pt will be independent with initial HEP Baseline:no knowledge Goal status: INITIAL   2.  Pt will perform BERG and make LTG Baseline:  Goal status: INITIAL   3.  Pt will be educated on pain management strategies and ADL/body mechanics and demonstrated proper lifting.  Baseline:  Goal status: INITIAL   4.  Pt will negotiate steps with safe and correct technique rather than avoiding in community Baseline:  Avoids stairs Goal status: INITIAL     LONG TERM GOALS: Target date: 11/22/2022   Pt will be  independent with advanced HEP Baseline: no knowledge Goal status: INITIAL   2.  Pt will be able to sit and watch TV show for an hour with pain managed to 4/10 or less Baseline: Pt can only sit 15 -30 min tolerance pain 8/10 to 10/10 Goal status: INITIAL   3.  Pt will be able to lift 25 lb of groceries from floor to counter without pain greater than 4/10 Baseline: unable to carry/lift  any weight without pain Goal status: INITIAL   4.  FOTO will improve from  35%  to   47%  indicating improved functional mobility.  Baseline: eval 35% Goal status: INITIAL   5.   Pt will self report >/= 50% decrease in pain  from evaluation Evaluation/Baseline (11/22/22): 5/10 max pain Baseline: 10/10 with movement/ 8/10 pain at rest globally Goal status: INITIAL   6.  BERG balance will be at least 52/56 from baseline Baseline:  Goal status: INITIAL    PLAN: PT FREQUENCY: 1-2x/week   PT DURATION: 8 weeks   PLANNED INTERVENTIONS: Therapeutic exercises, Therapeutic activity, Neuromuscular re-education, Balance training, Gait training, Patient/Family education, Self Care, Joint mobilization, Stair training, Vestibular training, Aquatic Therapy, Dry Needling, Electrical stimulation, Spinal mobilization, Cryotherapy, Moist heat, Taping, Ionotophoresis 4mg /ml Dexamethasone, Manual therapy, and Re-evaluation.   PLAN FOR NEXT SESSION: issue HEP and discuss expectations of aquatic therapy   , PT, DPT, LAT, ATC 10/27/22  1:55 PM Phone: [phone] Fax: [phone]     PHYSICAL THERAPY DISCHARGE SUMMARY  Visits from Start of Care: 2  Current functional level related to goals / functional outcomes: See above   Remaining deficits: See above   Education / Equipment: HEP   Patient agrees to discharge. Patient goals were not met. Patient is being discharged due to not returning since the last visit.  [phone], PT, DPT, LAT, ATC 11/30/22  9:43 AM Phone: [phone] Fax:  [phone]

## 2022-10-27 NOTE — Patient Instructions (Signed)
Access Code: [email or web address] URL: [email or web address] Date: 10/27/2022 Prepared by: Rosana Hoes  Exercises - Supine Lower Trunk Rotation  - 1-2 x daily - 10 reps - 5 seconds hold - Supine Piriformis Stretch with Foot on Ground  - 1-2 x daily - 3 sets - 3 reps - 20 seconds hold - Modified Thomas Stretch  - 1-2 x daily - 3 reps - 1 minutes hold - Sidelying Thoracic Lumbar Rotation  - 1-2 x daily - 2 sets - 5 reps - Bridge  - 1 x daily - 2 sets - 10 reps - Clamshell  - 1 x daily - 2 sets - 10 reps

## 2022-10-29 ENCOUNTER — Ambulatory Visit (INDEPENDENT_AMBULATORY_CARE_PROVIDER_SITE_OTHER): Payer: BC Managed Care – PPO

## 2022-10-29 DIAGNOSIS — M48061 Spinal stenosis, lumbar region without neurogenic claudication: Secondary | ICD-10-CM | POA: Diagnosis not present

## 2022-10-29 DIAGNOSIS — G8929 Other chronic pain: Secondary | ICD-10-CM | POA: Diagnosis not present

## 2022-10-29 DIAGNOSIS — M545 Low back pain, unspecified: Secondary | ICD-10-CM

## 2022-10-29 DIAGNOSIS — M5416 Radiculopathy, lumbar region: Secondary | ICD-10-CM

## 2022-10-31 ENCOUNTER — Telehealth: Payer: Self-pay | Admitting: Family Medicine

## 2022-10-31 DIAGNOSIS — M5416 Radiculopathy, lumbar region: Secondary | ICD-10-CM

## 2022-10-31 NOTE — Telephone Encounter (Signed)
Epidural steroid injection ordered 

## 2022-10-31 NOTE — Progress Notes (Signed)
Lumbar spine MRI does show areas where several nerves in your back could be pinched causing your leg pain.  I have ordered an epidural steroid injection.  We will talk more about your back MRI in further detail when you see me on the 28th.  [facility] should call you but you could call them to schedule at [phone]

## 2022-11-01 NOTE — Therapy (Incomplete)
OUTPATIENT PHYSICAL THERAPY TREATMENT NOTE   Patient Name: Carrie RuddleLatoya D Tilghman MRN: [id number] DOB:12/26/1975, 46 y.o., female Today's Date: 11/01/2022  PCP: Myrlene Brokerrawford, Elizabeth A, MD    REFERRING PROVIDER: Rodolph Bongorey, Evan S, MD    END OF SESSION:     Past Medical History:  Diagnosis Date   Hypertension    Iron deficiency anemia    Migraine    Migraine headache with aura 11/09/2020   Sarcoidosis    Vertigo    Past Surgical History:  Procedure Laterality Date   CESAREAN SECTION     CESAREAN SECTION     KNEE SURGERY Right    ACL   TUBAL LIGATION     Patient Active Problem List   Diagnosis Date Noted   Episodic recurrent vertigo 04/06/2022   Tobacco dependence due to cigarettes 02/06/2022   Inadequate sleep hygiene 02/06/2022   Morning headache 02/06/2022   Foot pain, bilateral 12/16/2021   Snoring 12/16/2021   Convulsions (HCC) 03/12/2021   Vertigo 11/09/2020   Migraine headache with aura 11/09/2020   History of COVID-19 09/16/2020   Degenerative arthritis of knee, bilateral 10/07/2019   Suspected COVID-19 virus infection 05/07/2019   Chronic pain of left ankle 04/13/2018   Sinusitis 11/25/2016   Vitamin D deficiency 07/28/2016   Routine general medical examination at a health care facility 04/25/2016   Obesity 04/25/2016   Tobacco abuse 04/25/2016   Community acquired pneumonia 09/28/2014   Anemia 09/28/2014   Menorrhagia 09/28/2014   Thrombocytopenia (HCC) 09/28/2014    REFERRING DIAG:  M54.6,G89.29 (ICD-10-CM) - Chronic bilateral thoracic back pain  M25.50 (ICD-10-CM) - Polyarthralgia    THERAPY DIAG:  No diagnosis found.  Rationale for Evaluation and Treatment Rehabilitation  PERTINENT HISTORY: Asthma, Sarcoidosis, R athroscopic meniscus surgery 2009, migraines, HTN, C-sections x 2   PRECAUTIONS: Fall   SUBJECTIVE:                                                                                                                                                                                      SUBJECTIVE STATEMENT:  Patient reports severe neck and back pain, and pain in left arm that has been horrible. She is scheduled for an MRI on 10/29/22.  PAIN:  Are you having pain? Yes:  NPRS scale: neck at rest 8/10 and at worst 10/  back pain is 8/10 at rest and 10/10 with movement Pain location: Neck, back , all over body pain Pain description: All over body pain throbbing pulsating , nagging Aggravating factors: getting up and down from chairs, stairs, Pt does not drive/license due to medications etc. Household chore standing for 10-15 min,  sit for 15-30 min ,  I try  to walk but for not longer than 10 min Relieving factors: none   OBJECTIVE: (objective measures completed at initial evaluation unless otherwise dated) PATIENT SURVEYS:  FOTO 35%  predicted 47%        SENSATION: WFL Except for left UE with tingling down left arm  MUSCLE LENGTH: Hamstrings: Right 70 deg; Left 69 deg   POSTURE:  Rounded shoulders, forward head, and anterior pelvic tilt obesity  PALPATION: Globally tender with neck.thoracic and back pain  CERVICAL ROM:    Active ROM A/PROM (deg) eval  Flexion 40 pain  Extension 52 pain  Right lateral flexion 25  Left lateral flexion 25  Right rotation 50  Left rotation 39   (Blank rows = not tested)   Grip strength  R 75lb, dominant  L 35 lb UE WNL bil   LUMBAR ROM:  pain with all motions   AROM eval  Flexion 50%  Extension 10%  Right lateral flexion 50%  Left lateral flexion 50%  Right rotation 50%  Left rotation 50%   (Blank rows = not tested)   LOWER EXTREMITY ROM:   Pain with movement globally   Active  Right eval Left eval  Hip flexion 70 75  Hip extension      Hip abduction      Hip adduction      Hip internal rotation      Hip external rotation      Knee flexion 121 119  Knee extension      Ankle dorsiflexion      Ankle plantarflexion      Ankle inversion      Ankle eversion        (Blank rows = not tested)   LOWER EXTREMITY MMT:     MMT Right eval Left eval Rt / Lt 10/27/2022  Hip flexion 4- 4-   Hip extension 3+ 3+   Hip abduction     3 / 3  Hip adduction 4- 4-   Hip internal rotation       Hip external rotation       Knee flexion 4 4   Knee extension 4 4   Ankle dorsiflexion       Ankle plantarflexion 10/25 8/25   Ankle inversion       Ankle eversion        (Blank rows = not tested)   LUMBAR SPECIAL TESTS:  Straight leg raise test: Negative and Slump test: Negative   FUNCTIONAL TESTS:  5 times sit to stand: 34.64 2 minute walk test: TBD BERG for balance   GAIT: Distance walked: 170 ft Assistive device utilized: None Level of assistance: Complete Independence Comments: Pt has reported falls   TODAY'S TREATMENT:      [facility] Adult PT Treatment:                                                DATE: 11-02-22                                     [facility] Adult PT Treatment:  DATE: 10/27/2022 Therapeutic Exercise: NuStep L4 x 5 min with UE/LE while taking subjective Seated physioball roll out lumbar flexion stretch x 5 forward, 3 lateral each LTR x 10 Piriformis stretch 2 x 20 sec each Modified thomas stretch 2 x 1 min each PPT 10 x 5 sec Bridge 2 x 10 Side clamshell 2 x 10 Side thoracic rotation x 5 each                                        PATIENT EDUCATION:  Education details: HEP update Person educated: Patient Education method: Explanation, Verbal cues, and Handouts Education comprehension: verbalized understanding and needs further education   HOME EXERCISE PROGRAM: Access Code: [email or web address]     ASSESSMENT: CLINICAL IMPRESSION:    Patient with fair tolerance for therapy this visit. Therapy focused on initiating HEP in order to improve spinal mobility and strengthening, primarily of lumbar and hip region. She continues to exhibit gross hip strength deficits. She was able to complete all  exercises despite increases in pain. She require cueing for proper pelvic tilt technique and core muscular activation. Provided patient with an HEP. She would benefit from continued skilled to progress her mobility and strength in order to reduce pain and maximize functional ability.    OBJECTIVE IMPAIRMENTS: decreased activity tolerance, decreased balance, difficulty walking, decreased ROM, decreased strength, impaired flexibility, impaired UE functional use, improper body mechanics, obesity, and pain.    ACTIVITY LIMITATIONS: carrying, lifting, sitting, standing, squatting, sleeping, stairs, transfers, reach over head, and caring for others   PARTICIPATION LIMITATIONS: meal prep, cleaning, and laundry   PERSONAL FACTORS: Asthma, Sarcoidosis, R athroscopic meniscus surgery 2009, migraines, HTN, C-sections x 2 are also affecting patient's functional outcome.      GOALS: Goals reviewed with patient? Yes   SHORT TERM GOALS: Target date: 10/25/2022   Pt will be independent with initial HEP Baseline:no knowledge Goal status: INITIAL   2.  Pt will perform BERG and make LTG Baseline:  Goal status: INITIAL   3.  Pt will be educated on pain management strategies and ADL/body mechanics and demonstrated proper lifting.  Baseline:  Goal status: INITIAL   4.  Pt will negotiate steps with safe and correct technique rather than avoiding in community Baseline:  Avoids stairs Goal status: INITIAL     LONG TERM GOALS: Target date: 11/22/2022   Pt will be independent with advanced HEP Baseline: no knowledge Goal status: INITIAL   2.  Pt will be able to sit and watch TV show for an hour with pain managed to 4/10 or less Baseline: Pt can only sit 15 -30 min tolerance pain 8/10 to 10/10 Goal status: INITIAL   3.  Pt will be able to lift 25 lb of groceries from floor to counter without pain greater than 4/10 Baseline: unable to carry/lift  any weight without pain Goal status: INITIAL   4.   FOTO will improve from  35%  to   47%  indicating improved functional mobility.  Baseline: eval 35% Goal status: INITIAL   5.   Pt will self report >/= 50% decrease in pain from evaluation Evaluation/Baseline (11/22/22): 5/10 max pain Baseline: 10/10 with movement/ 8/10 pain at rest globally Goal status: INITIAL   6.  BERG balance will be at least 52/56 from baseline Baseline:  Goal status: INITIAL    PLAN: PT FREQUENCY: 1-2x/week   PT DURATION:  8 weeks   PLANNED INTERVENTIONS: Therapeutic exercises, Therapeutic activity, Neuromuscular re-education, Balance training, Gait training, Patient/Family education, Self Care, Joint mobilization, Stair training, Vestibular training, Aquatic Therapy, Dry Needling, Electrical stimulation, Spinal mobilization, Cryotherapy, Moist heat, Taping, Ionotophoresis 4mg /ml Dexamethasone, Manual therapy, and Re-evaluation.   PLAN FOR NEXT SESSION: issue HEP and discuss expectations of aquatic therapy   ***

## 2022-11-02 ENCOUNTER — Telehealth: Payer: Self-pay | Admitting: Physical Therapy

## 2022-11-02 ENCOUNTER — Ambulatory Visit: Payer: BC Managed Care – PPO | Admitting: Physical Therapy

## 2022-11-02 NOTE — Telephone Encounter (Signed)
Spoke with pt about attendance.  Called last Wednesday 10-25-22 and pt had transportation problems and could not come to Aquatics.  PT was clear about pt attending today for appt. Pt was asked if she needs assistance with transportation and PT asked front office at [facility] street help to and contact pt about solutions.   Today on !01-03-22  pt did not show to appt and was called by PT.  Pt thought appt was rescheduled and did not come to PT.  She has an MRI scheduled and complained of being in pain.  PT advised pt to come to PT to help manage pain.  PT reminded pt she has several cancellation and now one NS.  PT clearly explained the attendance policy to pt and reminded her of next week appt in clinic 12/19 and aquatics 12/20.  Pt was told if she  NS for 12/19 that all appt would be cancelled going forward and she would need to make an appt one at a time as per attendance policy.  Pt verbalized understanding.     Garen Lah, PT, ATRIC Certified Exercise Expert for the Aging Adult  11/02/22 1:08 PM Phone: [phone] Fax: [phone]

## 2022-11-04 ENCOUNTER — Telehealth: Payer: Self-pay | Admitting: *Deleted

## 2022-11-04 NOTE — Telephone Encounter (Signed)
Called patient to make her aware this location only does outpatient PT ,and for her to send her request to her PCP .

## 2022-11-08 ENCOUNTER — Other Ambulatory Visit: Payer: Self-pay | Admitting: Family Medicine

## 2022-11-08 ENCOUNTER — Ambulatory Visit
Admission: RE | Admit: 2022-11-08 | Discharge: 2022-11-08 | Disposition: A | Discharge status: Home / Self Care | Payer: BC Managed Care – PPO | Source: Ambulatory Visit | Attending: Family Medicine | Admitting: Family Medicine

## 2022-11-08 ENCOUNTER — Ambulatory Visit: Payer: BC Managed Care – PPO | Admitting: Physical Therapy

## 2022-11-08 DIAGNOSIS — M5416 Radiculopathy, lumbar region: Secondary | ICD-10-CM

## 2022-11-08 MED ORDER — IOPAMIDOL (ISOVUE-M 200) INJECTION 41%
1.0000 mL | Freq: Once | INTRAMUSCULAR | Status: AC
  Administered 2022-11-08: 1 mL via EPIDURAL

## 2022-11-08 MED ORDER — METHYLPREDNISOLONE ACETATE 40 MG/ML INJ SUSP (RADIOLOG
80.0000 mg | Freq: Once | INTRAMUSCULAR | Status: AC
  Administered 2022-11-08: 80 mg via EPIDURAL

## 2022-11-08 NOTE — Discharge Instructions (Addendum)

## 2022-11-09 ENCOUNTER — Ambulatory Visit: Payer: BC Managed Care – PPO | Admitting: Physical Therapy

## 2022-11-11 ENCOUNTER — Telehealth: Payer: Self-pay | Admitting: Family Medicine

## 2022-11-16 ENCOUNTER — Ambulatory Visit: Payer: BC Managed Care – PPO | Admitting: Physical Therapy

## 2022-11-16 NOTE — Progress Notes (Unsigned)
   I, [name], LAT, ATC acting as a scribe for Carrie Graham, MD.  [name] is a 46 y.o. female who presents to [facility] at [facility] today for follow-up polyarthralgia and lumbar radiculopathy patient was last seen by Dr. Denyse Amass on 10/19/2022 and was advised to continue her treatment with rheumatology and an L-spine MRI was ordered.  Based on MRI findings, an ESI was ordered, and later performed on 11/08/2022.  Today, patient reports  Dx testing: 10/29/2022 L-spine MRI 09/13/2022 labs & T-spine, L-spine x-ray   Pertinent review of systems: ***  Relevant historical information: ***   Exam:  There were no vitals taken for this visit. General: Well Developed, well nourished, and in no acute distress.   MSK: ***    Lab and Radiology Results No results found for this or any previous visit (from the past 72 hour(s)). No results found.     Assessment and Plan: 46 y.o. female with ***   PDMP not reviewed this encounter. No orders of the defined types were placed in this encounter.  No orders of the defined types were placed in this encounter.    Discussed warning signs or symptoms. Please see discharge instructions. Patient expresses understanding.   ***

## 2022-11-17 ENCOUNTER — Ambulatory Visit (INDEPENDENT_AMBULATORY_CARE_PROVIDER_SITE_OTHER): Payer: BC Managed Care – PPO | Admitting: Family Medicine

## 2022-11-17 VITALS — BP 142/88 | HR 82 | Ht 65.0 in | Wt 201.0 lb

## 2022-11-17 DIAGNOSIS — M2550 Pain in unspecified joint: Secondary | ICD-10-CM

## 2022-11-17 DIAGNOSIS — R768 Other specified abnormal immunological findings in serum: Secondary | ICD-10-CM

## 2022-11-17 DIAGNOSIS — G8929 Other chronic pain: Secondary | ICD-10-CM

## 2022-11-17 DIAGNOSIS — M47816 Spondylosis without myelopathy or radiculopathy, lumbar region: Secondary | ICD-10-CM

## 2022-11-17 DIAGNOSIS — M5442 Lumbago with sciatica, left side: Secondary | ICD-10-CM | POA: Diagnosis not present

## 2022-11-17 NOTE — Patient Instructions (Addendum)
Thank you for coming in today.   Please call [facility] at [phone] to schedule your spine injection.    Let me know how it goes.   Tylenol arthritis 2 pills every 8 hours as needed.   Please call Rheumatology to get re-established.   [address], [address],  [place], [place] [address]  [phone]  [phone]  (E) [email or web address]

## 2022-11-23 ENCOUNTER — Ambulatory Visit: Payer: BC Managed Care – PPO | Admitting: Physical Therapy

## 2022-11-25 ENCOUNTER — Ambulatory Visit: Payer: BC Managed Care – PPO

## 2022-11-28 NOTE — Therapy (Incomplete)
OUTPATIENT PHYSICAL THERAPY TREATMENT NOTE   Patient Name: [name] MRN: [id number] DOB:1975/12/27, 47 y.o., female Today's Date: 11/28/2022  PCP: Myrlene Broker, MD    REFERRING PROVIDER: Rodolph Bong, MD    END OF SESSION:     Past Medical History:  Diagnosis Date   Hypertension    Iron deficiency anemia    Migraine    Migraine headache with aura 11/09/2020   Sarcoidosis    Vertigo    Past Surgical History:  Procedure Laterality Date   CESAREAN SECTION     CESAREAN SECTION     KNEE SURGERY Right    ACL   TUBAL LIGATION     Patient Active Problem List   Diagnosis Date Noted   Episodic recurrent vertigo 04/06/2022   Tobacco dependence due to cigarettes 02/06/2022   Inadequate sleep hygiene 02/06/2022   Morning headache 02/06/2022   Foot pain, bilateral 12/16/2021   Snoring 12/16/2021   Convulsions (HCC) 03/12/2021   Vertigo 11/09/2020   Migraine headache with aura 11/09/2020   History of COVID-19 09/16/2020   Degenerative arthritis of knee, bilateral 10/07/2019   Suspected COVID-19 virus infection 05/07/2019   Chronic pain of left ankle 04/13/2018   Sinusitis 11/25/2016   Vitamin D deficiency 07/28/2016   Routine general medical examination at a health care facility 04/25/2016   Obesity 04/25/2016   Tobacco abuse 04/25/2016   Community acquired pneumonia 09/28/2014   Anemia 09/28/2014   Menorrhagia 09/28/2014   Thrombocytopenia (HCC) 09/28/2014    REFERRING DIAG:  M54.6,G89.29 (ICD-10-CM) - Chronic bilateral thoracic back pain  M25.50 (ICD-10-CM) - Polyarthralgia    THERAPY DIAG:  No diagnosis found.  Rationale for Evaluation and Treatment Rehabilitation  PERTINENT HISTORY: Asthma, Sarcoidosis, R athroscopic meniscus surgery 2009, migraines, HTN, C-sections x 2   PRECAUTIONS: Fall   SUBJECTIVE:                                                                                                                                                                                      SUBJECTIVE STATEMENT:  Patient reports severe neck and back pain, and pain in left arm that has been horrible. She is scheduled for an MRI on 10/29/22.  PAIN:  Are you having pain? Yes:  NPRS scale: neck at rest 8/10 and at worst 10/  back pain is 8/10 at rest and 10/10 with movement Pain location: Neck, back , all over body pain Pain description: All over body pain throbbing pulsating , nagging Aggravating factors: getting up and down from chairs, stairs, Pt does not drive/license due to medications etc. Household chore standing for 10-15 min,  sit for 15-30 min ,  I try  to walk but for not longer than 10 min Relieving factors: none   OBJECTIVE: (objective measures completed at initial evaluation unless otherwise dated) PATIENT SURVEYS:  FOTO 35%  predicted 47%        SENSATION: WFL Except for left UE with tingling down left arm  MUSCLE LENGTH: Hamstrings: Right 70 deg; Left 69 deg   POSTURE:  Rounded shoulders, forward head, and anterior pelvic tilt  PALPATION: Globally tender with neck, thoracic and back pain  CERVICAL ROM:    Active ROM A/PROM (deg) eval  Flexion 40 pain  Extension 52 pain  Right lateral flexion 25  Left lateral flexion 25  Right rotation 50  Left rotation 39   (Blank rows = not tested)   Grip strength  R 75lb, dominant  L 35 lb   LUMBAR ROM: pain with all motions   AROM eval  Flexion 50%  Extension 10%  Right lateral flexion 50%  Left lateral flexion 50%  Right rotation 50%  Left rotation 50%   (Blank rows = not tested)   LOWER EXTREMITY ROM:   Pain with movement globally   Active  Right eval Left eval  Hip flexion 70 75  Hip extension      Hip abduction      Hip adduction      Hip internal rotation      Hip external rotation      Knee flexion 121 119  Knee extension      Ankle dorsiflexion      Ankle plantarflexion      Ankle inversion      Ankle eversion       (Blank rows = not  tested)   LOWER EXTREMITY MMT:     MMT Right eval Left eval Rt / Lt 10/27/2022  Hip flexion 4- 4-   Hip extension 3+ 3+   Hip abduction     3 / 3  Hip adduction 4- 4-   Hip internal rotation       Hip external rotation       Knee flexion 4 4   Knee extension 4 4   Ankle dorsiflexion       Ankle plantarflexion 10/25 8/25   Ankle inversion       Ankle eversion        (Blank rows = not tested)   LUMBAR SPECIAL TESTS:  Straight leg raise test: Negative and Slump test: Negative   FUNCTIONAL TESTS:  5 times sit to stand: 34.64 2 minute walk test: TBD BERG for balance   GAIT: Distance walked: 170 ft Assistive device utilized: None Level of assistance: Complete Independence Comments: Pt has reported falls    TODAY'S TREATMENT:                                           [facility] Adult PT Treatment:                                                DATE: 11/30/2022 Therapeutic Exercise: NuStep L4 x 5 min with UE/LE while taking subjective Seated physioball roll out lumbar flexion stretch x 5 forward, 3 lateral each LTR x 10 Piriformis stretch 2 x 20 sec each Modified thomas stretch 2  x 1 min each PPT 10 x 5 sec Bridge 2 x 10 Side clamshell 2 x 10 Side thoracic rotation x 5 each   [facility] Adult PT Treatment:                                                DATE: 10/27/2022 Therapeutic Exercise: NuStep L4 x 5 min with UE/LE while taking subjective Seated physioball roll out lumbar flexion stretch x 5 forward, 3 lateral each LTR x 10 Piriformis stretch 2 x 20 sec each Modified thomas stretch 2 x 1 min each PPT 10 x 5 sec Bridge 2 x 10 Side clamshell 2 x 10 Side thoracic rotation x 5 each                                      PATIENT EDUCATION:  Education details: HEP Person educated: Patient Education method: Programmer, multimedia, Verbal cues Education comprehension: verbalized understanding and needs further education   HOME EXERCISE PROGRAM: Access Code: [email or web address]      ASSESSMENT: CLINICAL IMPRESSION: Patient with fair tolerance for therapy this visit. *** She would benefit from continued skilled to progress her mobility and strength in order to reduce pain and maximize functional ability.  Therapy focused on initiating HEP in order to improve spinal mobility and strengthening, primarily of lumbar and hip region. She continues to exhibit gross hip strength deficits. She was able to complete all exercises despite increases in pain. She require cueing for proper pelvic tilt technique and core muscular activation. Provided patient with an HEP.     OBJECTIVE IMPAIRMENTS: decreased activity tolerance, decreased balance, difficulty walking, decreased ROM, decreased strength, impaired flexibility, impaired UE functional use, improper body mechanics, obesity, and pain.    ACTIVITY LIMITATIONS: carrying, lifting, sitting, standing, squatting, sleeping, stairs, transfers, reach over head, and caring for others   PARTICIPATION LIMITATIONS: meal prep, cleaning, and laundry   PERSONAL FACTORS: Asthma, Sarcoidosis, R athroscopic meniscus surgery 2009, migraines, HTN, C-sections x 2 are also affecting patient's functional outcome.      GOALS: Goals reviewed with patient? Yes   SHORT TERM GOALS: Target date: 10/25/2022   Pt will be independent with initial HEP Baseline:no knowledge Goal status: INITIAL   2.  Pt will perform BERG and make LTG Baseline:  Goal status: INITIAL   3.  Pt will be educated on pain management strategies and ADL/body mechanics and demonstrated proper lifting.  Baseline:  Goal status: INITIAL   4.  Pt will negotiate steps with safe and correct technique rather than avoiding in community Baseline:  Avoids stairs Goal status: INITIAL     LONG TERM GOALS: Target date: 11/22/2022   Pt will be independent with advanced HEP Baseline: no knowledge Goal status: INITIAL   2.  Pt will be able to sit and watch TV show for an hour with pain  managed to 4/10 or less Baseline: Pt can only sit 15 -30 min tolerance pain 8/10 to 10/10 Goal status: INITIAL   3.  Pt will be able to lift 25 lb of groceries from floor to counter without pain greater than 4/10 Baseline: unable to carry/lift  any weight without pain Goal status: INITIAL   4.  FOTO will improve from  35%  to   47%  indicating  improved functional mobility.  Baseline: eval 35% Goal status: INITIAL   5.   Pt will self report >/= 50% decrease in pain from evaluation Evaluation/Baseline (11/22/22): 5/10 max pain Baseline: 10/10 with movement/ 8/10 pain at rest globally Goal status: INITIAL   6.  BERG balance will be at least 52/56 from baseline Baseline:  Goal status: INITIAL    PLAN: PT FREQUENCY: 1-2x/week   PT DURATION: 8 weeks   PLANNED INTERVENTIONS: Therapeutic exercises, Therapeutic activity, Neuromuscular re-education, Balance training, Gait training, Patient/Family education, Self Care, Joint mobilization, Stair training, Vestibular training, Aquatic Therapy, Dry Needling, Electrical stimulation, Spinal mobilization, Cryotherapy, Moist heat, Taping, Ionotophoresis 4mg /ml Dexamethasone, Manual therapy, and Re-evaluation.   PLAN FOR NEXT SESSION: issue HEP and discuss expectations of aquatic therapy   , PT, DPT, LAT, ATC 11/28/22  1:45 PM Phone: [phone] Fax: [phone]

## 2022-11-30 ENCOUNTER — Telehealth: Payer: Self-pay | Admitting: Physical Therapy

## 2022-11-30 ENCOUNTER — Ambulatory Visit: Payer: BC Managed Care – PPO | Attending: Family Medicine | Admitting: Physical Therapy

## 2022-11-30 NOTE — Telephone Encounter (Signed)
Spoke with patient regarding missed PT appointment. She stated she had the wrong time of the appointment. Patient has only attended two appointments since 09/27/2022 and is planning to have injections in her lower back. Patient and PT agreed to discharge from PT until she completes the low back injections and is able to attend PT more regularly. Patient was advised she would need a new PT referral when she would like to return to therapy. Patient expressed understanding.  Hilda Blades, PT, DPT, LAT, ATC 11/30/22  9:37 AM Phone: [phone] Fax: [phone]

## 2022-12-06 ENCOUNTER — Telehealth: Payer: Self-pay | Admitting: Family Medicine

## 2022-12-06 NOTE — Telephone Encounter (Signed)
For our records:  [facility] states the pt's INS denied her visit despite being given the referral and office notes

## 2022-12-12 NOTE — Telephone Encounter (Signed)
Per cathy at [facility] imaging the only information given was "criteria not met". She said it did not say what that criteria was.

## 2022-12-12 NOTE — Telephone Encounter (Signed)
Msg sent to cathy at [facility] imaging to find out why injections were denied.

## 2023-02-02 ENCOUNTER — Other Ambulatory Visit: Payer: Self-pay | Admitting: Neurology

## 2023-02-02 ENCOUNTER — Other Ambulatory Visit: Payer: Self-pay | Admitting: Internal Medicine

## 2023-02-08 ENCOUNTER — Ambulatory Visit: Payer: BC Managed Care – PPO | Admitting: Internal Medicine

## 2023-02-13 ENCOUNTER — Ambulatory Visit: Payer: BC Managed Care – PPO | Admitting: Internal Medicine

## 2023-02-14 ENCOUNTER — Ambulatory Visit: Payer: BC Managed Care – PPO | Admitting: Internal Medicine

## 2023-03-01 ENCOUNTER — Other Ambulatory Visit: Payer: Self-pay | Admitting: Internal Medicine

## 2023-03-20 ENCOUNTER — Telehealth: Payer: Self-pay | Admitting: Neurology

## 2023-03-20 NOTE — Telephone Encounter (Signed)
Pt requesting refill on venlafaxine XR (EFFEXOR-XR) 150 MG 24 hr capsule. Should be sent to  [facility] [phone]

## 2023-03-21 MED ORDER — VENLAFAXINE HCL ER 150 MG PO CP24: 150.0000 mg | ORAL_CAPSULE | Freq: Every day | ORAL | 0 refills | Status: DC

## 2023-03-21 NOTE — Addendum Note (Signed)
Addended by: Berneice Gandy A on: 03/21/2023 08:20 AM   Modules accepted: Orders

## 2023-04-19 ENCOUNTER — Telehealth: Payer: Self-pay | Admitting: Neurology

## 2023-04-19 NOTE — Telephone Encounter (Signed)
Unable to LVM, no VM box. Sent mychart msg informing pt of need to reschedule 06/05/23 appt - MD out

## 2023-04-25 ENCOUNTER — Other Ambulatory Visit: Payer: Self-pay | Admitting: Neurology

## 2023-04-25 ENCOUNTER — Other Ambulatory Visit: Payer: Self-pay | Admitting: Internal Medicine

## 2023-05-30 ENCOUNTER — Telehealth: Payer: Self-pay | Admitting: Neurology

## 2023-05-30 NOTE — Telephone Encounter (Signed)
Pt stated during reminder call that she misplaced her venlafaxine XR and would like a refill before appointment if possible

## 2023-05-30 NOTE — Telephone Encounter (Signed)
Pt has apt tomorrow. We can address refilling the medication at the appt tomorrow

## 2023-05-31 ENCOUNTER — Encounter: Payer: Self-pay | Admitting: Neurology

## 2023-05-31 ENCOUNTER — Ambulatory Visit: Payer: BC Managed Care – PPO | Admitting: Neurology

## 2023-05-31 VITALS — BP 126/83 | HR 69 | Ht 65.0 in | Wt 197.0 lb

## 2023-05-31 DIAGNOSIS — G43701 Chronic migraine without aura, not intractable, with status migrainosus: Secondary | ICD-10-CM | POA: Diagnosis not present

## 2023-05-31 DIAGNOSIS — J301 Allergic rhinitis due to pollen: Secondary | ICD-10-CM

## 2023-05-31 DIAGNOSIS — J4520 Mild intermittent asthma, uncomplicated: Secondary | ICD-10-CM | POA: Diagnosis not present

## 2023-05-31 MED ORDER — CETIRIZINE HCL 5 MG PO TABS: 5.0000 mg | ORAL_TABLET | Freq: Every day | ORAL | 5 refills | Status: AC

## 2023-05-31 MED ORDER — EMGALITY 120 MG/ML ~~LOC~~ SOSY: 120.0000 mg | PREFILLED_SYRINGE | SUBCUTANEOUS | 11 refills | Status: DC

## 2023-05-31 MED ORDER — RIZATRIPTAN BENZOATE 10 MG PO TBDP: 10.0000 mg | ORAL_TABLET | ORAL | 11 refills | Status: AC | PRN

## 2023-05-31 NOTE — Patient Instructions (Signed)
Cetirizine Tablets What is this medication? CETIRIZINE (se TI ra zeen) prevents and treats allergy symptoms, such as red, itchy eyes, sneezing, a runny or stuffy nose, or hives. It works by blocking histamine, a substance released by the body during an allergic reaction. It belongs to a group of medications called antihistamines. This medicine may be used for other purposes; ask your health care provider or pharmacist if you have questions. COMMON BRAND NAME(S): All Day Allergy, Allergy Relief, Zyrtec, Zyrtec Hives Relief What should I tell my care team before I take this medication? They need to know if you have any of these conditions: Kidney disease Liver disease An unusual or allergic reaction to cetirizine, other medications, foods, dyes, or preservatives Pregnant or trying to get pregnant Breastfeeding How should I use this medication? Take this medication by mouth with a glass of water. Follow the directions on the prescription label. You can take this medication with food or on an empty stomach. Take your medication at regular times. Do not take more often than directed. You may need to take this medication for several days before your symptoms improve. Talk to your care team about the use of this medication in children. Special care may be needed. While this medication may be prescribed for children as young as 6 years of age for selected conditions, precautions do apply. Overdosage: If you think you have taken too much of this medicine contact a poison control center or emergency room at once. NOTE: This medicine is only for you. Do not share this medicine with others. What if I miss a dose? If you miss a dose, take it as soon as you can. If it is almost time for your next dose, take only that dose. Do not take double or extra doses. What may interact with this medication? Alcohol Certain medications for anxiety or sleep Opioid medications for pain Other medications for colds or  allergies This list may not describe all possible interactions. Give your health care provider a list of all the medicines, herbs, non-prescription drugs, or dietary supplements you use. Also tell them if you smoke, drink alcohol, or use illegal drugs. Some items may interact with your medicine. What should I watch for while using this medication? Visit your care team for regular checks on your progress. Tell your care team if your symptoms do not start to get better or if they get worse. This medication may affect your coordination, reaction time, or judgment. Do not drive or operate machinery until you know how this medication affects you. Sit up or stand slowly to reduce the risk of dizzy or fainting spells. Drinking alcohol with this medication can increase the risk of these side effects. Your mouth may get dry. Chewing sugarless gum or sucking hard candy and drinking plenty of water may help. Contact your care team if the problem does not go away or is severe. What side effects may I notice from receiving this medication? Side effects that you should report to your care team as soon as possible: Allergic reactions--skin rash, itching, hives, swelling of the face, lips, tongue, or throat Side effects that usually do not require medical attention (report these to your care team if they continue or are bothersome): Dizziness Drowsiness Dry mouth Fatigue This list may not describe all possible side effects. Call your doctor for medical advice about side effects. You may report side effects to FDA at 1-800-FDA-1088. Where should I keep my medication? Keep out of the reach of children.  Store at room temperature between 15 and 30 degrees C (59 and 86 degrees F). Throw away any unused medication after the expiration date. NOTE: This sheet is a summary. It may not cover all possible information. If you have questions about this medicine, talk to your doctor, pharmacist, or health care provider.  2024  Elsevier/Gold Standard (2023-01-15 00:00:00)

## 2023-05-31 NOTE — Progress Notes (Signed)
Provider:  Melvyn Novas, MD  Primary Care Physician:  Myrlene Broker, MD [address] [place] [address]     Referring Provider: Judi Saa, Do [address] [place],  [place] [address]          Chief Complaint according to patient   Patient presents with:     Carrie Dapper, NP Patient (Initial Visit)     Pt is here for follow up on migraines. Pt states migraines are bad on occasions but the medicine Rizatriptan and [name] help. States she needs refills on these medications.       HISTORY OF PRESENT ILLNESS:  [name] is a 47 y.o. female patient who is here for revisit 05/31/2023 for  migraine follow up.  Chief concern according to patient :  still having migraines, photophobia nausea. She remains obese, her sleep study revealed no hypoxia or apnea. I had referred this patient back to PCP,  She remains on [name] and her migraine count is about 2 /month, during allergy season it can be 5/month.  Rizatryptan takes 30 minutes to work. It works every time but makes her sleepy.  Here for refills and I suggested to take a zyrtec OTC every night for year around protection.  She had already 2 times sinusitis this year.  I have asked her PCP to take over her medication regimen.     [name] is a 47 y.o. female here today for follow up for migraines. She was having worsening headaches and dizziness at last f/u with Dr Anne Hahn. He continued venlafaxine 150mg  daily and added propranolol. She could not tolerate propranolol and was switched to [facility] 08/2021. She has taken 1 injection (ordered for 1ml). She uses Tylenol extra strength that has variable results. She has 3-4 migraines every week. BP is 148/101. She takes amlodipine 5mg  daily for BP. She reports home readings are usually 140's/80-90's. She initially denies snoring but then does state that her family has recorded her sleeping and she does snore a little. She reports strong family  history of sleep apnea. She continues to have intermittent dizziness.      HISTORY (copied from Dr Anne Hahn' previous note)   [name] is a 46 year old right-handed black female with a history of frequent migraine headaches and vertigo.  The patient is having least 2-3 headaches a week at this point, she is on Effexor taking 150 mg daily with minimal benefit.  She cannot tolerate Topamax previously.  She oftentimes has to lie down with the headache.  She is having a lot of anxiety issues as well.  She is not sleeping well at night.  She returns to the office today for an evaluation.      Review of Systems: Out of a complete 14 system review, the patient complains of only the following symptoms, and all other reviewed systems are negative.:  Non organic  Insomnia, no apnea.   Total = 22/ 24 points   FSS endorsed at 58/ 63 points.   Social History   Socioeconomic History   Marital status: Legally Separated    Spouse name: Not on file   Number of children: 3   Years of education: 14   Highest education level: Not on file  Occupational History   Occupation: unemployed 11/09/20   Occupation: disabled  Tobacco Use   Smoking status: Every Day    Packs/day: 1.00    Years: 17.00  Additional pack years: 0.00    Total pack years: 17.00    Types: Cigarettes   Smokeless tobacco: Never  Vaping Use   Vaping Use: Never used  Substance and Sexual Activity   Alcohol use: Not Currently   Drug use: No   Sexual activity: Not on file  Other Topics Concern   Not on file  Social History Narrative   Lives with son   Right Handed   Drinks 6-8 cups caffeine daily.   Social Determinants of Health   Financial Resource Strain: Not on file  Food Insecurity: Not on file  Transportation Needs: Not on file  Physical Activity: Not on file  Stress: Not on file  Social Connections: Not on file    Family History  Problem Relation Age of Onset   Lupus Mother    Healthy Father    Heart failure  Maternal Grandmother    Diabetes Maternal Grandmother    Hypertension Maternal Grandmother    Thyroid disease Maternal Grandmother    Diabetes Maternal Grandfather    Diabetes Paternal Grandmother    Thyroid disease Sister    Anemia Sister     Past Medical History:  Diagnosis Date   Hypertension    Iron deficiency anemia    Migraine    Migraine headache with aura 11/09/2020   Sarcoidosis    Vertigo     Past Surgical History:  Procedure Laterality Date   CESAREAN SECTION     CESAREAN SECTION     KNEE SURGERY Right    ACL   TUBAL LIGATION       Current Outpatient Medications on File Prior to Visit  Medication Sig Dispense Refill   albuterol (VENTOLIN HFA) 108 (90 Base) MCG/ACT inhaler Inhale 1-2 puffs into the lungs every 6 (six) hours as needed for wheezing or shortness of breath. 6.7 g 0   amLODipine (NORVASC) 5 MG tablet TAKE 1 TABLET (5 MG TOTAL) BY MOUTH DAILY.Marland Kitchen NEED OFFICE VISIT 30 tablet 0   B Complex-Folic Acid (B COMPLEX PLUS) TABS Take 1 tablet by mouth daily. 100 tablet 3   calcium-vitamin D (RA HI-CAL PLUS VITAMIN D) 500-200 MG-UNIT tablet Take 1 tablet by mouth daily with breakfast. 90 tablet 1   D3-1000 25 MCG (1000 UT) capsule Take 1 capsule by mouth daily. Annual appt due w/labs must see provider for future refills 30 capsule 0   Galcanezumab-gnlm ([name]) 120 MG/ML SOSY INJECT 120 MG INTO THE SKIN EVERY 30 (THIRTY) DAYS. 1 mL 11   rizatriptan (MAXALT-MLT) 10 MG disintegrating tablet Take 1 tablet (10 mg total) by mouth as needed for migraine. May repeat in 2 hours if needed 9 tablet 11   venlafaxine XR (EFFEXOR-XR) 150 MG 24 hr capsule Take 1 capsule (150 mg total) by mouth daily with breakfast. Please call and make overdue appt for further refills 90 capsule 0   Vitamin D, Ergocalciferol, (DRISDOL) 1.25 MG (50000 UNIT) CAPS capsule TAKE 1 CAPSULE (50,000 UNITS TOTAL) BY MOUTH EVERY 7 (SEVEN) DAYS 4 capsule 0   buPROPion (WELLBUTRIN XL) 150 MG 24 hr tablet  TAKE 1 TABLET BY MOUTH EVERY DAY (Patient not taking: Reported on 09/27/2022) 90 tablet 1   Prenatal Vit-Fe Fumarate-FA (PRENATAL PLUS VITAMIN/MINERAL) 27-1 MG TABS Take 1 tablet by mouth daily with breakfast. (Patient not taking: Reported on 09/27/2022)     No current facility-administered medications on file prior to visit.    Allergies  Allergen Reactions   Fish Allergy Anaphylaxis and Shortness Of Breath  Naproxen Anxiety, Other (See Comments) and Anaphylaxis    Vaginal bleeding and anxiety Other reaction(s): Bleeding (intolerance) Stomach bleeding Vaginal bleeding and anxiety   Other Anaphylaxis and Rash   Sulfa Antibiotics Anxiety, Other (See Comments) and Anaphylaxis    Vaginal bleeding and anxiety Vaginal bleeding and anxiety Vaginal bleeding and anxiety   Valium [Diazepam] Nausea And Vomiting   Asa [Aspirin]    Topamax [Topiramate]     Heart racing     DIAGNOSTIC DATA (LABS, IMAGING, TESTING) - I reviewed patient records, labs, notes, testing and imaging myself where available.  Lab Results  Component Value Date   WBC 5.7 01/05/2022   HGB 12.1 01/05/2022   HCT 38.1 01/05/2022   MCV 90.1 01/05/2022   PLT 261 01/05/2022      Component Value Date/Time   NA 139 01/05/2022 2150   K 3.6 01/05/2022 2150   CL 105 01/05/2022 2150   CO2 24 01/05/2022 2150   GLUCOSE 94 01/05/2022 2150   BUN 9 01/05/2022 2150   CREATININE 0.84 01/05/2022 2150   CALCIUM 8.8 (L) 01/05/2022 2150   PROT 8.3 03/12/2021 1534   ALBUMIN 4.0 03/12/2021 1534   AST 17 03/12/2021 1534   ALT 15 03/12/2021 1534   ALKPHOS 92 03/12/2021 1534   BILITOT 0.3 03/12/2021 1534   GFRNONAA >60 01/05/2022 2150   GFRAA >60 06/04/2020 0229   Lab Results  Component Value Date   CHOL 143 07/28/2016   HDL 63.60 07/28/2016   LDLCALC 71 07/28/2016   TRIG 45.0 07/28/2016   CHOLHDL 2 07/28/2016   Lab Results  Component Value Date   HGBA1C 5.3 09/05/2019   Lab Results  Component Value Date   VITAMINB12  230 03/12/2021   Lab Results  Component Value Date   TSH 0.61 03/12/2021    PHYSICAL EXAM:  Today's Vitals   05/31/23 1547  BP: 126/83  Pulse: 69  Weight: 197 lb (89.4 kg)  Height: 5\' 5"  (1.651 m)   Body mass index is 32.78 kg/m.   Wt Readings from Last 3 Encounters:  05/31/23 197 lb (89.4 kg)  11/17/22 201 lb (91.2 kg)  09/13/22 198 lb (89.8 kg)     Ht Readings from Last 3 Encounters:  05/31/23 5\' 5"  (1.651 m)  11/17/22 5\' 5"  (1.651 m)  10/19/22 5\' 5"  (1.651 m)      General:  ASSESSMENT AND PLAN 47 y.o. year old female  here with:    1) allergic non seasonal rhinitis-  use zyrted daily, its OTC   2) I refilled your [name] the last time, you can get this through PCP.   3) I refilled maxalt for you, you can get this through your PCP.   I wish you good luck to loose weight.    I plan not to follow up  personally and will offer prn only acess through our NP .   I would like to thank Myrlene Broker, MD and Judi Saa, Do [address] [place],  [place] [address] for allowing me to meet with and to take care of this pleasant patient.   After spending a total time of  25  minutes face to face and additional time for physical and neurologic examination, review of laboratory studies,  personal review of imaging studies, reports and results of other testing and review of referral information / records as far as provided in visit,   Electronically signed by: Melvyn Novas, MD 05/31/2023 3:57 PM  [facility]  and [employer] certified by [employer] of Sleep Medicine and Diplomate of the [employer] of [employer] Medicine. Board certified In Neurology through the ABPN, Fellow of the [employer] of Neu[employer].

## 2023-06-01 ENCOUNTER — Other Ambulatory Visit: Payer: Self-pay | Admitting: Neurology

## 2023-06-05 ENCOUNTER — Ambulatory Visit: Payer: BC Managed Care – PPO | Admitting: Neurology

## 2023-06-19 ENCOUNTER — Encounter: Payer: Self-pay | Admitting: Family Medicine

## 2023-06-19 ENCOUNTER — Ambulatory Visit (INDEPENDENT_AMBULATORY_CARE_PROVIDER_SITE_OTHER): Payer: BC Managed Care – PPO | Admitting: Family Medicine

## 2023-06-19 VITALS — BP 100/62 | HR 60 | Temp 97.0°F | Resp 20 | Ht 65.0 in | Wt 194.0 lb

## 2023-06-19 DIAGNOSIS — Z1211 Encounter for screening for malignant neoplasm of colon: Secondary | ICD-10-CM | POA: Diagnosis not present

## 2023-06-19 DIAGNOSIS — J0140 Acute pansinusitis, unspecified: Secondary | ICD-10-CM

## 2023-06-19 MED ORDER — AMOXICILLIN-POT CLAVULANATE 875-125 MG PO TABS: 1.0000 | ORAL_TABLET | Freq: Two times a day (BID) | ORAL | 0 refills | Status: AC

## 2023-06-19 NOTE — Progress Notes (Signed)
Assessment & Plan:  1. Acute non-recurrent pansinusitis Education provided on sinus infections.  Discussed symptom management including throat lozenges, chloraseptic spray, warm salt water gargles, hot tea/honey, cough syrup (Delsym), Tylenol/Ibuprofen, Vicks, and a humidifier at night.  - amoxicillin-clavulanate (AUGMENTIN) 875-125 MG tablet; Take 1 tablet by mouth 2 (two) times daily for 7 days.  Dispense: 14 tablet; Refill: 0  2. Screening for colon cancer - Ambulatory referral to Gastroenterology   No results found for any visits on 06/19/23.  Follow up plan: Return if symptoms worsen or fail to improve.  Carrie Boston, MSN, APRN, FNP-C  Subjective:  HPI: [name] is a 47 y.o. female presenting on 06/19/2023 for Sinusitis (Congestion, some facial pressure x 1 week /No fever) and Cough (Cough, hard to catch breathe at times - wonders if it could be asthma - worse with laying down )  Patient complains of cough, head congestion, sneezing, facial pain/pressure, postnasal drainage, and wheezing. She denies runny nose, sore throat, and shortness of breath. Onset of symptoms was 1 week ago, gradually improving since that time. She is drinking plenty of fluids. Evaluation to date: none. Treatment to date:  an Albuterol inhaler which has not been helpful; hot tea with ginger . She does smoke.    ROS: Negative unless specifically indicated above in HPI.   Relevant past medical history reviewed and updated as indicated.   Allergies and medications reviewed and updated.   Current Outpatient Medications:    albuterol (VENTOLIN HFA) 108 (90 Base) MCG/ACT inhaler, Inhale 1-2 puffs into the lungs every 6 (six) hours as needed for wheezing or shortness of breath., Disp: 6.7 g, Rfl: 0   Galcanezumab-gnlm ([name]) 120 MG/ML SOSY, Inject 120 mg into the skin every 30 (thirty) days., Disp: 1 mL, Rfl: 11   rizatriptan (MAXALT-MLT) 10 MG disintegrating tablet, Take 1 tablet (10 mg total) by  mouth as needed for migraine. May repeat in 2 hours if needed, Disp: 10 tablet, Rfl: 11   venlafaxine XR (EFFEXOR-XR) 150 MG 24 hr capsule, TAKE 1 CAPSULE BY MOUTH DAILY WITH BREAKFAST. PLEASE CALL AND MAKE OVERDUE APPT FOR FURTHER REFILLS, Disp: 90 capsule, Rfl: 2   amLODipine (NORVASC) 5 MG tablet, TAKE 1 TABLET (5 MG TOTAL) BY MOUTH DAILY.Marland Kitchen NEED OFFICE VISIT (Patient not taking: Reported on 06/19/2023), Disp: 30 tablet, Rfl: 0   B Complex-Folic Acid (B COMPLEX PLUS) TABS, Take 1 tablet by mouth daily. (Patient not taking: Reported on 06/19/2023), Disp: 100 tablet, Rfl: 3   calcium-vitamin D (RA HI-CAL PLUS VITAMIN D) 500-200 MG-UNIT tablet, Take 1 tablet by mouth daily with breakfast., Disp: 90 tablet, Rfl: 1   cetirizine (ZYRTEC) 5 MG tablet, Take 1 tablet (5 mg total) by mouth daily. (Patient not taking: Reported on 06/19/2023), Disp: 30 tablet, Rfl: 5   Prenatal Vit-Fe Fumarate-FA (PRENATAL PLUS VITAMIN/MINERAL) 27-1 MG TABS, Take 1 tablet by mouth daily with breakfast. (Patient not taking: Reported on 06/19/2023), Disp: , Rfl:    Vitamin D, Ergocalciferol, (DRISDOL) 1.25 MG (50000 UNIT) CAPS capsule, TAKE 1 CAPSULE (50,000 UNITS TOTAL) BY MOUTH EVERY 7 (SEVEN) DAYS (Patient not taking: Reported on 06/19/2023), Disp: 4 capsule, Rfl: 0  Allergies  Allergen Reactions   Fish Allergy Anaphylaxis and Shortness Of Breath   Naproxen Anxiety, Other (See Comments) and Anaphylaxis    Vaginal bleeding and anxiety Other reaction(s): Bleeding (intolerance) Stomach bleeding Vaginal bleeding and anxiety   Other Anaphylaxis and Rash   Sulfa Antibiotics Anxiety, Other (See Comments) and  Anaphylaxis    Vaginal bleeding and anxiety Vaginal bleeding and anxiety Vaginal bleeding and anxiety   Valium [Diazepam] Nausea And Vomiting   Asa [Aspirin]    Topamax [Topiramate]     Heart racing    Objective:   BP 100/62   Pulse 60   Temp (!) 97 F (36.1 C)   Resp 20   Ht 5\' 5"  (1.651 m)   Wt 194 lb (88 kg)    LMP 06/18/2021 (Approximate)   SpO2 99% Comment: RA  BMI 32.28 kg/m    Physical Exam Vitals reviewed.  Constitutional:      General: She is not in acute distress.    Appearance: Normal appearance. She is not ill-appearing, toxic-appearing or diaphoretic.  HENT:     Head: Normocephalic and atraumatic.     Right Ear: Tympanic membrane, ear canal and external ear normal. There is no impacted cerumen.     Left Ear: Tympanic membrane, ear canal and external ear normal. There is no impacted cerumen.     Nose: Congestion present. No rhinorrhea.     Right Sinus: No maxillary sinus tenderness or frontal sinus tenderness.     Left Sinus: No maxillary sinus tenderness or frontal sinus tenderness.     Mouth/Throat:     Mouth: Mucous membranes are moist.     Pharynx: Oropharynx is clear. Posterior oropharyngeal erythema present. No oropharyngeal exudate.  Eyes:     General: No scleral icterus.       Right eye: No discharge.        Left eye: No discharge.     Conjunctiva/sclera: Conjunctivae normal.  Cardiovascular:     Rate and Rhythm: Regular rhythm. Bradycardia present.     Heart sounds: Normal heart sounds. No murmur heard.    No friction rub. No gallop.  Pulmonary:     Effort: Pulmonary effort is normal. No respiratory distress.     Breath sounds: Normal breath sounds. No stridor. No wheezing, rhonchi or rales.  Musculoskeletal:        General: Normal range of motion.     Cervical back: Normal range of motion.  Lymphadenopathy:     Cervical: Cervical adenopathy present.  Skin:    General: Skin is warm and dry.     Capillary Refill: Capillary refill takes less than 2 seconds.  Neurological:     General: No focal deficit present.     Mental Status: She is alert and oriented to person, place, and time. Mental status is at baseline.  Psychiatric:        Mood and Affect: Mood normal.        Behavior: Behavior normal.        Thought Content: Thought content normal.        Judgment:  Judgment normal.

## 2023-06-19 NOTE — Patient Instructions (Signed)
Throat lozenges, chloraseptic spray, warm salt water gargles, hot tea/honey, cough syrup (Delsym), Tylenol/Ibuprofen, Vicks, and a humidifier at night.  

## 2023-06-20 ENCOUNTER — Ambulatory Visit: Payer: BC Managed Care – PPO | Admitting: Emergency Medicine

## 2023-07-05 ENCOUNTER — Ambulatory Visit: Payer: BC Managed Care – PPO | Admitting: Neurology

## 2023-08-02 ENCOUNTER — Ambulatory Visit (AMBULATORY_SURGERY_CENTER): Payer: BC Managed Care – PPO | Admitting: *Deleted

## 2023-08-02 VITALS — Ht 65.0 in | Wt 196.0 lb

## 2023-08-02 DIAGNOSIS — Z1211 Encounter for screening for malignant neoplasm of colon: Secondary | ICD-10-CM

## 2023-08-02 MED ORDER — NA SULFATE-K SULFATE-MG SULF 17.5-3.13-1.6 GM/177ML PO SOLN: 1.0000 | Freq: Once | ORAL | 0 refills | Status: AC

## 2023-08-02 NOTE — Progress Notes (Signed)
Pt's name and DOB verified at the beginning of the pre-visit.  Pt denies any difficulty with ambulating,sitting, laying down or rolling side to side Gave both [facility] main # and MD on call # prior to instructions.  No egg or soy allergy known to patient  No issues known to pt with past sedation with any surgeries or procedures Pt denies having issues being intubated Pt has no issues moving head neck or swallowing No FH of Malignant Hyperthermia Pt is not on diet pills Pt is not on home 02  Pt is not on blood thinners  Pt has frequent issues with constipation RN instructed pt to use Miralax per bottles instructions a week before prep days. Pt states they will Pt is not on dialysis Pt denise any abnormal heart rhythms  Pt denies any upcoming cardiac testing Pt encouraged to use to use Singlecare or Goodrx to reduce cost  Patient's chart reviewed by Cathlyn Parsons CNRA prior to pre-visit and patient appropriate for the [facility].  Pre-visit completed and red dot placed by patient's name on their procedure day (on provider's schedule).  . Visit by phone Pt states weight is 196 lb Instructed pt why it is important to and  to call if they have any changes in health or new medications. Directed them to the # given and on instructions.   Pt states they will.  Instructions reviewed with pt and pt states understanding. Instructed to review again prior to procedure. Pt states they will.  Instructions sent by mail with coupon and by my chart

## 2023-08-21 ENCOUNTER — Encounter: Payer: Self-pay | Admitting: Gastroenterology

## 2023-08-25 ENCOUNTER — Encounter: Payer: Self-pay | Admitting: Gastroenterology

## 2023-08-25 ENCOUNTER — Ambulatory Visit (AMBULATORY_SURGERY_CENTER): Payer: BC Managed Care – PPO | Admitting: Gastroenterology

## 2023-08-25 ENCOUNTER — Encounter: Payer: BC Managed Care – PPO | Admitting: Gastroenterology

## 2023-08-25 VITALS — BP 105/62 | HR 53 | Temp 96.6°F | Resp 14 | Ht 65.0 in | Wt 196.0 lb

## 2023-08-25 DIAGNOSIS — K635 Polyp of colon: Secondary | ICD-10-CM

## 2023-08-25 DIAGNOSIS — D128 Benign neoplasm of rectum: Secondary | ICD-10-CM

## 2023-08-25 DIAGNOSIS — D125 Benign neoplasm of sigmoid colon: Secondary | ICD-10-CM

## 2023-08-25 DIAGNOSIS — K621 Rectal polyp: Secondary | ICD-10-CM | POA: Diagnosis not present

## 2023-08-25 DIAGNOSIS — Z1211 Encounter for screening for malignant neoplasm of colon: Secondary | ICD-10-CM

## 2023-08-25 MED ORDER — SODIUM CHLORIDE 0.9 % IV SOLN: 500.0000 mL | Freq: Once | INTRAVENOUS | Status: DC

## 2023-08-25 NOTE — Progress Notes (Signed)
Pt's states no medical or surgical changes since previsit or office visit. 

## 2023-08-25 NOTE — Patient Instructions (Signed)

## 2023-08-25 NOTE — Op Note (Signed)
[facility] Patient Name: [name] Procedure Date: 08/25/2023 9:40 AM MRN: [id number] Endoscopist: Lorin Picket E. Tomasa Rand , MD, [id number] Age: 47 Referring MD:  Date of Birth: September 22, 1976 Gender: Female Account #: [id number] Procedure:                Colonoscopy Indications:              Screening for colorectal malignant neoplasm, This                            is the patient's first colonoscopy Medicines:                Monitored Anesthesia Care Procedure:                Pre-Anesthesia Assessment:                           - Prior to the procedure, a History and Physical                            was performed, and patient medications and                            allergies were reviewed. The patient's tolerance of                            previous anesthesia was also reviewed. The risks                            and benefits of the procedure and the sedation                            options and risks were discussed with the patient.                            All questions were answered, and informed consent                            was obtained. Prior Anticoagulants: The patient has                            taken no anticoagulant or antiplatelet agents. ASA                            Grade Assessment: II - A patient with mild systemic                            disease. After reviewing the risks and benefits,                            the patient was deemed in satisfactory condition to                            undergo the procedure.  After obtaining informed consent, the colonoscope                            was passed under direct vision. Throughout the                            procedure, the patient's blood pressure, pulse, and                            oxygen saturations were monitored continuously. The                            Olympus Scope [id number] was introduced through the                            anus and  advanced to the the cecum, identified by                            appendiceal orifice and ileocecal valve. The                            colonoscopy was performed without difficulty. The                            patient tolerated the procedure well. The quality                            of the bowel preparation was good. The ileocecal                            valve, appendiceal orifice, and rectum were                            photographed. The bowel preparation used was SUPREP                            via split dose instruction. Scope In: 9:44:50 AM Scope Out: 10:01:10 AM Scope Withdrawal Time: 0 hours 11 minutes 52 seconds  Total Procedure Duration: 0 hours 16 minutes 20 seconds  Findings:                 The perianal and digital rectal examinations were                            normal. Pertinent negatives include normal                            sphincter tone and no palpable rectal lesions.                           A 4 mm polyp was found in the sigmoid colon. The                            polyp was sessile. The polyp was removed  with a                            cold snare. Resection and retrieval were complete.                            Estimated blood loss was minimal.                           Multiple sessile polyps were found in the rectum.                            The polyps were 1 to 3 mm in size. One of these                            polyps was removed with a cold snare. Resection and                            retrieval were complete. Estimated blood loss was                            minimal.                           A single medium-mouthed diverticulum was found in                            the transverse colon. There was no evidence of                            diverticular bleeding.                           The exam was otherwise normal throughout the                            examined colon.                           The retroflexed view of  the distal rectum and anal                            verge was normal and showed no anal or rectal                            abnormalities. Complications:            No immediate complications. Estimated Blood Loss:     Estimated blood loss was minimal. Impression:               - One 4 mm polyp in the sigmoid colon, removed with                            a cold snare. Resected and retrieved.                           -  Multiple 1 to 3 mm polyps in the rectum, removed                            with a cold snare. One of these was resected and                            retrieved. These were consistent with hyperplastic                            polyps.                           - Mild diverticulosis in the transverse colon.                            There was no evidence of diverticular bleeding.                           - The distal rectum and anal verge are normal on                            retroflexion view. Recommendation:           - Patient has a contact number available for                            emergencies. The signs and symptoms of potential                            delayed complications were discussed with the                            patient. Return to normal activities tomorrow.                            Written discharge instructions were provided to the                            patient.                           - Resume previous diet.                           - Continue present medications.                           - Await pathology results.                           - Repeat colonoscopy (date not yet determined) for                            surveillance based on pathology results. Serrena Linderman E. Tomasa Rand, MD 08/25/2023 10:07:57 AM This report has been signed electronically.

## 2023-08-25 NOTE — Progress Notes (Signed)
Sedate, gd SR, tolerated procedure well, VSS, report to RN 

## 2023-08-25 NOTE — Progress Notes (Signed)
[facility] History and Physical   Primary Care Physician:  Myrlene Broker, MD   Reason for Procedure:   Colon cancer screening  Plan:    Screening colonoscopy     HPI: [name] is a 47 y.o. female undergoing initial average risk screening colonoscopy.  She has no family history of colon cancer and no chronic GI symptoms.    Past Medical History:  Diagnosis Date   Arthritis    Osto   Constipation    GERD (gastroesophageal reflux disease)    Hypertension    Iron deficiency anemia    Migraine    Migraine headache with aura 11/09/2020   Sarcoidosis    Vertigo     Past Surgical History:  Procedure Laterality Date   CESAREAN SECTION     CESAREAN SECTION     KNEE SURGERY Right    ACL   steroid shots     TUBAL LIGATION      Prior to Admission medications   Medication Sig Start Date End Date Taking? Authorizing Provider  amLODipine (NORVASC) 5 MG tablet TAKE 1 TABLET (5 MG TOTAL) BY MOUTH DAILY.Marland Kitchen NEED OFFICE VISIT 02/02/23  Yes Myrlene Broker, MD  venlafaxine XR (EFFEXOR-XR) 150 MG 24 hr capsule TAKE 1 CAPSULE BY MOUTH DAILY WITH BREAKFAST. PLEASE CALL AND MAKE OVERDUE APPT FOR FURTHER REFILLS 06/01/23  Yes Dohmeier, Porfirio Mylar, MD  Vitamin D, Ergocalciferol, (DRISDOL) 1.25 MG (50000 UNIT) CAPS capsule TAKE 1 CAPSULE (50,000 UNITS TOTAL) BY MOUTH EVERY 7 (SEVEN) DAYS 01/10/22  Yes Plotnikov, Georgina Quint, MD  albuterol (VENTOLIN HFA) 108 (90 Base) MCG/ACT inhaler Inhale 1-2 puffs into the lungs every 6 (six) hours as needed for wheezing or shortness of breath. 03/14/22   Myrlene Broker, MD  B Complex-Folic Acid (B COMPLEX PLUS) TABS Take 1 tablet by mouth daily. 12/16/21   Plotnikov, Georgina Quint, MD  calcium-vitamin D (RA HI-CAL PLUS VITAMIN D) 500-200 MG-UNIT tablet Take 1 tablet by mouth daily with breakfast. Patient not taking: Reported on 08/02/2023 08/27/20   Sande Rives, MD  cetirizine (ZYRTEC) 5 MG tablet Take 1 tablet (5 mg total) by  mouth daily. 05/31/23   Dohmeier, Porfirio Mylar, MD  Galcanezumab-gnlm [facility]) 120 MG/ML SOSY Inject 120 mg into the skin every 30 (thirty) days. 05/31/23   Dohmeier, Porfirio Mylar, MD  Prenatal Vit-Fe Fumarate-FA (PRENATAL PLUS VITAMIN/MINERAL) 27-1 MG TABS Take 1 tablet by mouth daily with breakfast. Patient not taking: Reported on 08/02/2023 09/16/22   Dohmeier, Porfirio Mylar, MD  rizatriptan (MAXALT-MLT) 10 MG disintegrating tablet Take 1 tablet (10 mg total) by mouth as needed for migraine. May repeat in 2 hours if needed 05/31/23   Dohmeier, Porfirio Mylar, MD    Current Outpatient Medications  Medication Sig Dispense Refill   amLODipine (NORVASC) 5 MG tablet TAKE 1 TABLET (5 MG TOTAL) BY MOUTH DAILY.Marland Kitchen NEED OFFICE VISIT 30 tablet 0   venlafaxine XR (EFFEXOR-XR) 150 MG 24 hr capsule TAKE 1 CAPSULE BY MOUTH DAILY WITH BREAKFAST. PLEASE CALL AND MAKE OVERDUE APPT FOR FURTHER REFILLS 90 capsule 2   Vitamin D, Ergocalciferol, (DRISDOL) 1.25 MG (50000 UNIT) CAPS capsule TAKE 1 CAPSULE (50,000 UNITS TOTAL) BY MOUTH EVERY 7 (SEVEN) DAYS 4 capsule 0   albuterol (VENTOLIN HFA) 108 (90 Base) MCG/ACT inhaler Inhale 1-2 puffs into the lungs every 6 (six) hours as needed for wheezing or shortness of breath. 6.7 g 0   B Complex-Folic Acid (B COMPLEX PLUS) TABS Take 1 tablet by mouth daily. 100 tablet  3   calcium-vitamin D (RA HI-CAL PLUS VITAMIN D) 500-200 MG-UNIT tablet Take 1 tablet by mouth daily with breakfast. (Patient not taking: Reported on 08/02/2023) 90 tablet 1   cetirizine (ZYRTEC) 5 MG tablet Take 1 tablet (5 mg total) by mouth daily. 30 tablet 5   Galcanezumab-gnlm ([name]) 120 MG/ML SOSY Inject 120 mg into the skin every 30 (thirty) days. 1 mL 11   Prenatal Vit-Fe Fumarate-FA (PRENATAL PLUS VITAMIN/MINERAL) 27-1 MG TABS Take 1 tablet by mouth daily with breakfast. (Patient not taking: Reported on 08/02/2023)     rizatriptan (MAXALT-MLT) 10 MG disintegrating tablet Take 1 tablet (10 mg total) by mouth as needed for  migraine. May repeat in 2 hours if needed 10 tablet 11   Current Facility-Administered Medications  Medication Dose Route Frequency Provider Last Rate Last Admin   0.9 %  sodium chloride infusion  500 mL Intravenous Once Jenel Lucks, MD        Allergies as of 08/25/2023 - Review Complete 08/25/2023  Allergen Reaction Noted   Fish allergy Anaphylaxis and Shortness Of Breath 12/16/2012   Naproxen Anxiety, Other (See Comments), and Anaphylaxis 01/23/2012   Other Anaphylaxis and Rash 12/16/2012   Sulfa antibiotics Anxiety, Other (See Comments), and Anaphylaxis 12/16/2012   Valium [diazepam] Nausea And Vomiting 05/28/2020   Asa [aspirin]  10/29/2019   Topamax [topiramate]  03/12/2021    Family History  Problem Relation Age of Onset   Lupus Mother    Healthy Father    Thyroid disease Sister    Anemia Sister    Heart failure Maternal Grandmother    Diabetes Maternal Grandmother    Hypertension Maternal Grandmother    Thyroid disease Maternal Grandmother    Diabetes Maternal Grandfather    Diabetes Paternal Grandmother    Colon polyps Neg Hx    Colon cancer Neg Hx    Esophageal cancer Neg Hx    Stomach cancer Neg Hx    Rectal cancer Neg Hx     Social History   Socioeconomic History   Marital status: Legally Separated    Spouse name: Not on file   Number of children: 3   Years of education: 14   Highest education level: Not on file  Occupational History   Occupation: unemployed 11/09/20   Occupation: disabled  Tobacco Use   Smoking status: Every Day    Current packs/day: 1.00    Average packs/day: 1 pack/day for 17.0 years (17.0 ttl pk-yrs)    Types: Cigarettes   Smokeless tobacco: Never  Vaping Use   Vaping status: Never Used  Substance and Sexual Activity   Alcohol use: Not Currently   Drug use: No   Sexual activity: Not on file  Other Topics Concern   Not on file  Social History Narrative   Lives with son   Right Handed   Drinks 6-8 cups caffeine  daily.   Social Determinants of Health   Financial Resource Strain: Not on file  Food Insecurity: Not on file  Transportation Needs: Not on file  Physical Activity: Not on file  Stress: Not on file  Social Connections: Unknown (04/04/2022)   Received from [facility]   Social Network    Social Network: Not on file  Intimate Partner Violence: Unknown (02/24/2022)   Received from [facility], [facility]   HITS    Physically Hurt: Not on file    Insult or Talk Down To: Not on file    Threaten Physical Harm:  Not on file    Scream or Curse: Not on file    Review of Systems:  All other review of systems negative except as mentioned in the HPI.  Physical Exam: Vital signs BP 123/78   Pulse 62   Temp (!) 96.6 F (35.9 C)   Ht 5\' 5"  (1.651 m)   Wt 196 lb (88.9 kg)   LMP 06/18/2021 (Approximate)   SpO2 99%   BMI 32.62 kg/m   General:   Alert,  Well-developed, well-nourished, pleasant and cooperative in NAD Airway:  Mallampati 2 Lungs:  Clear throughout to auscultation.   Heart:  Regular rate and rhythm; no murmurs, clicks, rubs,  or gallops. Abdomen:  Soft, nontender and nondistended. Normal bowel sounds.   Neuro/Psych:  Normal mood and affect. A and O x 3   Chidera Thivierge E. Tomasa Rand, MD [facility]

## 2023-08-25 NOTE — Progress Notes (Signed)
Called to room to assist during endoscopic procedure.  Patient ID and intended procedure confirmed with present staff. Received instructions for my participation in the procedure from the performing physician.  

## 2023-08-28 ENCOUNTER — Telehealth: Payer: Self-pay | Admitting: *Deleted

## 2023-08-28 NOTE — Telephone Encounter (Signed)
  Follow up Call-     08/25/2023    8:39 AM  Call back number  Post procedure Call Back phone  # [phone]  Permission to leave phone message Yes     Patient questions:  Do you have a fever, pain , or abdominal swelling? No. Pain Score  0 *  Have you tolerated food without any problems? Yes.    Have you been able to return to your normal activities? Yes.    Do you have any questions about your discharge instructions: Diet   No. Medications  No. Follow up visit  No.  Do you have questions or concerns about your Care? No.  Actions: * If pain score is 4 or above: No action needed, pain <4.

## 2023-08-29 LAB — SURGICAL PATHOLOGY

## 2023-09-01 NOTE — Progress Notes (Signed)
[name],  One polyp which I removed during your recent procedure was proven to be completely benign but is considered a "pre-cancerous" polyp that MAY have grown into cancer if it had not been removed.  The rectal polyps were not precancerous.  Studies shows that at least 20% of women over age 48 and 30% of men over age 76 have pre-cancerous polyps.  Based on current nationally recognized surveillance guidelines, I recommend that you have a repeat colonoscopy in 7 years.   If you develop any new rectal bleeding, abdominal pain or significant bowel habit changes, please contact me before then.

## 2023-09-04 DIAGNOSIS — H30033 Focal chorioretinal inflammation, peripheral, bilateral: Secondary | ICD-10-CM | POA: Diagnosis not present

## 2023-09-04 NOTE — Telephone Encounter (Signed)
Note opened in error.

## 2023-09-15 ENCOUNTER — Other Ambulatory Visit (HOSPITAL_BASED_OUTPATIENT_CLINIC_OR_DEPARTMENT_OTHER): Payer: Self-pay

## 2023-10-02 ENCOUNTER — Ambulatory Visit (INDEPENDENT_AMBULATORY_CARE_PROVIDER_SITE_OTHER): Payer: BC Managed Care – PPO

## 2023-10-02 ENCOUNTER — Other Ambulatory Visit: Payer: Self-pay

## 2023-10-02 ENCOUNTER — Ambulatory Visit (INDEPENDENT_AMBULATORY_CARE_PROVIDER_SITE_OTHER): Payer: BC Managed Care – PPO | Admitting: Family Medicine

## 2023-10-02 ENCOUNTER — Encounter: Payer: Self-pay | Admitting: Family Medicine

## 2023-10-02 VITALS — BP 122/84 | HR 60 | Ht 65.0 in | Wt 195.0 lb

## 2023-10-02 DIAGNOSIS — M25461 Effusion, right knee: Secondary | ICD-10-CM | POA: Diagnosis not present

## 2023-10-02 DIAGNOSIS — M25561 Pain in right knee: Secondary | ICD-10-CM

## 2023-10-02 DIAGNOSIS — M1711 Unilateral primary osteoarthritis, right knee: Secondary | ICD-10-CM | POA: Diagnosis not present

## 2023-10-02 NOTE — Progress Notes (Signed)
I, [name], CMA acting as a scribe for Carrie Graham, MD.  [name] is a 47 y.o. female who presents to [facility] at [facility] today for R knee pain. Pt was previously seen by Dr. Denyse Amass on 11/18/23 for LBP and polyarthralgia.  Today, pt c/o R knee pain ongoing since Friday after suffering a fall. Tripped over the baby and fell when coming out of the bathroom. Pt locates pain to peri-patellar region. Swelling present at time of injury. Some improvement since onset  R Knee swelling: yes Mechanical symptoms: yes Aggravates: WB, ambulation Treatments tried: ACE wrap, Tylenol, ice  Pertinent review of systems: No fevers or chills  Relevant historical information: Significantly elevated ANA titer   Exam:  BP 122/84   Pulse 60   Ht 5\' 5"  (1.651 m)   Wt 195 lb (88.5 kg)   LMP 06/18/2021 (Approximate)   SpO2 98%   BMI 32.45 kg/m  General: Well Developed, well nourished, and in no acute distress.   MSK: Right knee moderate effusion.  Tender palpation overlying anterior knee especially at the distal patellar tendon. Motion restricted especially flexion. Strength she does have intact extension strength with an antalgic gait. Stable ligamentous exam although accuracy limited by guarding.    Lab and Radiology Results  Procedure: Real-time Ultrasound Guided Injection of aspiration and injection right knee effusion superior patellar space Device: Philips Affiniti 50G/GE Logiq Images permanently stored and available for review in PACS Ultrasound evaluation prior to injection shows moderate effusion and intact quadriceps tendon. Inferior patellar pole does have a osteophyte that is not connected bone to bone on ultrasound however the surrounding patellar tendon insertion looks normal.  Distal patellar tendon intact. Verbal informed consent obtained.  Discussed risks and benefits of procedure. Warned about infection, bleeding, hyperglycemia damage to  structures among others. Patient expresses understanding and agreement Time-out conducted.   Noted no overlying erythema, induration, or other signs of local infection.   Skin prepped in a sterile fashion.   Local anesthesia: Topical Ethyl chloride.   With sterile technique and under real time ultrasound guidance: 2 mL of lidocaine injected into cutaneous tissue achieving good anesthesia.. Skin was again sterilized with isopropyl alcohol and an 18-gauge needle was used to access the joint effusion. 30 mL of clear blood-tinged fluid aspirated.  Despite repositioning the needle and visible remaining fluid on ultrasound I could not aspirate the rest of the fluid without significant needle repositioning.  A joint decision was made to discontinue aspiration and proceed with injection. Syringe was exchanged and 40 mg of Kenalog and 2 mL of Marcaine were injected into the knee joint.  Completed without difficulty   Pain moderately immediately resolved suggesting accurate placement of the medication.   Advised to call if fevers/chills, erythema, induration, drainage, or persistent bleeding.   Images permanently stored and available for review in the ultrasound unit.  Impression: Technically successful ultrasound guided aspiration and injection.   X-ray images right knee obtained today personally independently interpreted. 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  Inferior patellar pole osteophyte that appears to be either fractured or not mechanically connected to the inferior patella visible.  I do not have any x-rays of the knees to compare this to. No definitive acute fractures are visible. Await formal radiology review    Assessment and Plan: 47 y.o. female with right knee pain after a fall.  This pain is thought to be exacerbation of DJD and perhaps a fracture of an osteophyte at  the inferior patella pole.  The extensor mechanism is intact and she does not have significant joint laxity on  exam today.  Joint effusion was aspirated and injected.  Recommend compression sleeve and Ace wrap.  Recheck in 2 weeks.  Recommend Voltaren gel.   PDMP not reviewed this encounter. Orders Placed This Encounter  Procedures   DG Knee AP/LAT W/Sunrise Right    Standing Status:   Future    Number of Occurrences:   1    Standing Expiration Date:   11/01/2023    Order Specific Question:   Reason for Exam (SYMPTOM  OR DIAGNOSIS REQUIRED)    Answer:   right knee pain    Order Specific Question:   Preferred imaging location?    Answer:   [facility]    Order Specific Question:   Is patient pregnant?    Answer:   No   [place] LIMITED JOINT SPACE STRUCTURES LOW RIGHT(NO LINKED CHARGES)    Order Specific Question:   Reason for Exam (SYMPTOM  OR DIAGNOSIS REQUIRED)    Answer:   right knee pain    Order Specific Question:   Preferred imaging location?    Answer:   [facility]   No orders of the defined types were placed in this encounter.    Discussed warning signs or symptoms. Please see discharge instructions. Patient expresses understanding.   The above documentation has been reviewed and is accurate and complete [name], M.D.

## 2023-10-02 NOTE — Patient Instructions (Addendum)
Thank you for coming in today.  You received an injection today. Seek immediate medical attention if the joint becomes red, extremely painful, or is oozing fluid.  Try a compression sleeve for your knee.   Try Voltaren Gel for your knee.   OK to use crutches

## 2023-10-13 NOTE — Progress Notes (Unsigned)
   Rubin Payor, PhD, LAT, ATC acting as a scribe for [name], MD.  Dyann Ruddle is a 47 y.o. female who presents to [facility] at [facility] today for 2-wk f/u R knee pain. Pt was last seen by Dr. Denyse Amass on 10/02/23 and her R knee and was advised to use compression and Voltaren gel.  Today, pt reports ***  Dx imaging: 10/02/23 L knee XR  Pertinent review of systems: ***  Relevant historical information: ***   Exam:  LMP 06/18/2021 (Approximate)  General: Well Developed, well nourished, and in no acute distress.   MSK: ***    Lab and Radiology Results No results found for this or any previous visit (from the past 72 hour(s)). No results found.     Assessment and Plan: 47 y.o. female with ***   PDMP not reviewed this encounter. No orders of the defined types were placed in this encounter.  No orders of the defined types were placed in this encounter.    Discussed warning signs or symptoms. Please see discharge instructions. Patient expresses understanding.   ***

## 2023-10-16 ENCOUNTER — Encounter: Payer: Self-pay | Admitting: Family Medicine

## 2023-10-16 ENCOUNTER — Ambulatory Visit (INDEPENDENT_AMBULATORY_CARE_PROVIDER_SITE_OTHER): Payer: BC Managed Care – PPO | Admitting: Family Medicine

## 2023-10-16 VITALS — BP 130/84 | HR 78 | Ht 65.0 in | Wt 194.0 lb

## 2023-10-16 DIAGNOSIS — M25561 Pain in right knee: Secondary | ICD-10-CM | POA: Diagnosis not present

## 2023-10-16 DIAGNOSIS — S82091A Other fracture of right patella, initial encounter for closed fracture: Secondary | ICD-10-CM

## 2023-10-16 NOTE — Patient Instructions (Signed)
Thank you for coming in today.   You should hear from MRI scheduling within 1 week. If you do not hear please let me know.    Recheck once we get the results back from the MRI.

## 2023-10-20 ENCOUNTER — Telehealth: Payer: BC Managed Care – PPO | Admitting: Family Medicine

## 2023-10-20 DIAGNOSIS — R062 Wheezing: Secondary | ICD-10-CM

## 2023-10-20 DIAGNOSIS — J069 Acute upper respiratory infection, unspecified: Secondary | ICD-10-CM | POA: Diagnosis not present

## 2023-10-20 MED ORDER — BENZONATATE 100 MG PO CAPS: 100.0000 mg | ORAL_CAPSULE | Freq: Three times a day (TID) | ORAL | 0 refills | Status: AC | PRN

## 2023-10-20 MED ORDER — ALBUTEROL SULFATE HFA 108 (90 BASE) MCG/ACT IN AERS: 1.0000 | INHALATION_SPRAY | Freq: Four times a day (QID) | RESPIRATORY_TRACT | 0 refills | Status: AC | PRN

## 2023-10-20 MED ORDER — FLUTICASONE PROPIONATE 50 MCG/ACT NA SUSP: 2.0000 | Freq: Every day | NASAL | 0 refills | Status: AC

## 2023-10-20 NOTE — Progress Notes (Signed)

## 2023-10-22 ENCOUNTER — Emergency Department (HOSPITAL_BASED_OUTPATIENT_CLINIC_OR_DEPARTMENT_OTHER)
Admission: EM | Admit: 2023-10-22 | Discharge: 2023-10-22 | Disposition: A | Discharge status: Home / Self Care | Payer: BC Managed Care – PPO | Attending: Emergency Medicine | Admitting: Emergency Medicine

## 2023-10-22 ENCOUNTER — Other Ambulatory Visit: Payer: Self-pay

## 2023-10-22 ENCOUNTER — Emergency Department (HOSPITAL_BASED_OUTPATIENT_CLINIC_OR_DEPARTMENT_OTHER): Payer: BC Managed Care – PPO

## 2023-10-22 ENCOUNTER — Encounter (HOSPITAL_BASED_OUTPATIENT_CLINIC_OR_DEPARTMENT_OTHER): Payer: Self-pay | Admitting: Emergency Medicine

## 2023-10-22 DIAGNOSIS — R051 Acute cough: Secondary | ICD-10-CM

## 2023-10-22 DIAGNOSIS — J4521 Mild intermittent asthma with (acute) exacerbation: Secondary | ICD-10-CM | POA: Diagnosis not present

## 2023-10-22 DIAGNOSIS — I10 Essential (primary) hypertension: Secondary | ICD-10-CM | POA: Diagnosis not present

## 2023-10-22 DIAGNOSIS — Z1152 Encounter for screening for COVID-19: Secondary | ICD-10-CM | POA: Diagnosis not present

## 2023-10-22 DIAGNOSIS — R059 Cough, unspecified: Secondary | ICD-10-CM | POA: Diagnosis not present

## 2023-10-22 LAB — RESP PANEL BY RT-PCR (RSV, FLU A&B, COVID)  RVPGX2
Influenza A by PCR: NEGATIVE
Influenza B by PCR: NEGATIVE
Resp Syncytial Virus by PCR: NEGATIVE
SARS Coronavirus 2 by RT PCR: NEGATIVE

## 2023-10-22 MED ORDER — BENZONATATE 100 MG PO CAPS
100.0000 mg | ORAL_CAPSULE | Freq: Three times a day (TID) | ORAL | 0 refills | Status: DC
Start: 2023-10-22 — End: 2024-04-05

## 2023-10-22 MED ORDER — BENZONATATE 100 MG PO CAPS
100.0000 mg | ORAL_CAPSULE | Freq: Once | ORAL | Status: AC
  Administered 2023-10-22: 100 mg via ORAL
  Filled 2023-10-22: qty 1

## 2023-10-22 MED ORDER — PREDNISONE 20 MG PO TABS: 40.0000 mg | ORAL_TABLET | Freq: Every day | ORAL | 0 refills | Status: AC

## 2023-10-22 NOTE — Discharge Instructions (Signed)
It was a pleasure taking care of you today.  As discussed, your chest x-ray did not show any evidence of pneumonia.  Your COVID, flu, RSV test were negative.  I suspect you have a viral infection causing your symptoms.  I am sending you home with steroids for a possible asthma exacerbation.  Take for the next 5 days.  Continue using your albuterol inhaler as needed.  Please follow-up with PCP if symptoms do not improve over the next few days.  Return to the ER for any worsening symptoms.

## 2023-10-22 NOTE — ED Triage Notes (Signed)
Pt c/o cough x a few days

## 2023-10-22 NOTE — ED Provider Notes (Signed)
[facility] [facility] Provider Note   CSN: [id number] Arrival date & time: 10/22/23  1542     History  Chief Complaint  Patient presents with   Cough    NEASHA [name] is a 47 y.o. female with a past medical history significant for iron deficiency anemia, history of migraines, GERD, and hypertension who presents to the ED due to persistent cough x 3 to 4 days.  Patient admits to occasional productive cough with "dark phlegm".  Also admits to some nasal congestion.  Patient states she has been wheezing typically with exertion over the past few days.  She was seen through a televisit and was prescribed an albuterol inhaler.  Patient does have a history of asthma.  No chest pain.  Denies shortness of breath.  No fever or chills.  History obtained from patient and past medical records. No interpreter used during encounter.       Home Medications Prior to Admission medications   Medication Sig Start Date End Date Taking? Authorizing Provider  benzonatate (TESSALON) 100 MG capsule Take 1 capsule (100 mg total) by mouth every 8 (eight) hours. 10/22/23  Yes Elianys Conry, Merla Riches, PA-C  predniSONE (DELTASONE) 20 MG tablet Take 2 tablets (40 mg total) by mouth daily for 5 days. 10/22/23 10/27/23 Yes Alcides Nutting, Merla Riches, PA-C  albuterol (VENTOLIN HFA) 108 (90 Base) MCG/ACT inhaler Inhale 1-2 puffs into the lungs every 6 (six) hours as needed for wheezing or shortness of breath. 10/20/23 11/19/23  Reed Pandy, PA-C  amLODipine (NORVASC) 5 MG tablet TAKE 1 TABLET (5 MG TOTAL) BY MOUTH DAILY.Marland Kitchen NEED OFFICE VISIT 02/02/23   Myrlene Broker, MD  B Complex-Folic Acid (B COMPLEX PLUS) TABS Take 1 tablet by mouth daily. 12/16/21   Plotnikov, Georgina Quint, MD  benzonatate (TESSALON) 100 MG capsule Take 1 capsule (100 mg total) by mouth 3 (three) times daily as needed for up to 7 days for cough. 10/20/23 10/27/23  Reed Pandy, PA-C  calcium-vitamin D (RA HI-CAL PLUS  VITAMIN D) 500-200 MG-UNIT tablet Take 1 tablet by mouth daily with breakfast. 08/27/20   O'Neal, Ronnald Ramp, MD  cetirizine (ZYRTEC) 5 MG tablet Take 1 tablet (5 mg total) by mouth daily. 05/31/23   Dohmeier, Porfirio Mylar, MD  fluticasone (FLONASE) 50 MCG/ACT nasal spray Place 2 sprays into both nostrils daily. 10/20/23   Reed Pandy, PA-C  Galcanezumab-gnlm ([name]) 120 MG/ML SOSY Inject 120 mg into the skin every 30 (thirty) days. 05/31/23   Dohmeier, Porfirio Mylar, MD  Prenatal Vit-Fe Fumarate-FA (PRENATAL PLUS VITAMIN/MINERAL) 27-1 MG TABS Take 1 tablet by mouth daily with breakfast. 09/16/22   Dohmeier, Porfirio Mylar, MD  rizatriptan (MAXALT-MLT) 10 MG disintegrating tablet Take 1 tablet (10 mg total) by mouth as needed for migraine. May repeat in 2 hours if needed 05/31/23   Dohmeier, Porfirio Mylar, MD  venlafaxine XR (EFFEXOR-XR) 150 MG 24 hr capsule TAKE 1 CAPSULE BY MOUTH DAILY WITH BREAKFAST. PLEASE CALL AND MAKE OVERDUE APPT FOR FURTHER REFILLS 06/01/23   Dohmeier, Porfirio Mylar, MD  Vitamin D, Ergocalciferol, (DRISDOL) 1.25 MG (50000 UNIT) CAPS capsule TAKE 1 CAPSULE (50,000 UNITS TOTAL) BY MOUTH EVERY 7 (SEVEN) DAYS 01/10/22   Plotnikov, Georgina Quint, MD      Allergies    Fish allergy, Naproxen, Other, Sulfa antibiotics, Valium [diazepam], Asa [aspirin], and Topamax [topiramate]    Review of Systems   Review of Systems  Constitutional:  Negative for fever.  Respiratory:  Positive for cough and wheezing. Negative for  shortness of breath.   Cardiovascular:  Negative for chest pain.    Physical Exam Updated Vital Signs BP (!) 141/85 (BP Location: Right Arm)   Pulse 65   Temp 97.9 F (36.6 C) (Oral)   Resp 18   Ht 5\' 5"  (1.651 m)   Wt 88 kg   LMP 06/18/2021 (Approximate)   SpO2 100%   BMI 32.28 kg/m  Physical Exam Vitals and nursing note reviewed.  Constitutional:      General: She is not in acute distress.    Appearance: She is not ill-appearing.  HENT:     Head: Normocephalic.  Eyes:     Pupils:  Pupils are equal, round, and reactive to light.  Cardiovascular:     Rate and Rhythm: Normal rate and regular rhythm.     Pulses: Normal pulses.     Heart sounds: Normal heart sounds. No murmur heard.    No friction rub. No gallop.  Pulmonary:     Effort: Pulmonary effort is normal.     Breath sounds: Normal breath sounds.     Comments: Respirations equal and unlabored, patient able to speak in full sentences, lungs clear to auscultation bilaterally Abdominal:     General: Abdomen is flat. There is no distension.     Palpations: Abdomen is soft.     Tenderness: There is no abdominal tenderness. There is no guarding or rebound.  Musculoskeletal:        General: Normal range of motion.     Cervical back: Neck supple.  Skin:    General: Skin is warm and dry.  Neurological:     General: No focal deficit present.     Mental Status: She is alert.  Psychiatric:        Mood and Affect: Mood normal.        Behavior: Behavior normal.     ED Results / Procedures / Treatments   Labs (all labs ordered are listed, but only abnormal results are displayed) Labs Reviewed  RESP PANEL BY RT-PCR (RSV, FLU A&B, COVID)  [id number]    EKG None  Radiology DG Chest 1 View  Result Date: 10/22/2023 CLINICAL DATA:  Shortness of breath EXAM: CHEST  1 VIEW COMPARISON:  X-ray 01/05/2022. FINDINGS: The heart size and mediastinal contours are within normal limits. No consolidation, pneumothorax or effusion. No edema. Underinflation. The visualized skeletal structures are unremarkable. Slight curvature of the spine could be positional IMPRESSION: Underinflation.  No acute cardiopulmonary disease. Electronically Signed   By: Karen Kays M.D.   On: 10/22/2023 17:20    Procedures Procedures    Medications Ordered in ED Medications  benzonatate (TESSALON) capsule 100 mg (100 mg Oral Given 10/22/23 1614)    ED Course/ Medical Decision Making/ A&P                                 Medical Decision  Making Amount and/or Complexity of Data Reviewed Radiology: ordered and independent interpretation performed. Decision-making details documented in ED Course.  Risk Prescription drug management.   This patient presents to the ED for concern of cough, this involves an extensive number of treatment options, and is a complaint that carries with it a high risk of complications and morbidity.  The differential diagnosis includes viral process, pneumonia, PE, asthma exacerbation, etc  47 year old female presents to the ED due to productive cough and wheezing for the past few days.  History of  asthma.  No chest pain or shortness of breath.  Upon arrival patient afebrile, not tachycardic or hypoxic.  Patient in no acute distress.  Reassuring physical exam.  Lungs clear to auscultation bilaterally.  No wheeze on exam however, patient used her albuterol inhaler prior to arrival.  No lower extremity edema.  COVID/influenza/RSV negative.  Chest x-ray personally reviewed and interpreted which is negative for signs of pneumonia.  Suspect symptoms related to viral etiology.  Will discharge patient with steroids for asthma exacerbation due to patient reported wheeze.  Patient already has an albuterol inhaler.  Low suspicion for PE/DVT.  Patient also discharged with cough medication. No signs of respiratory distress on exam. Patient stable for discharge. Strict ED precautions discussed with patient. Patient states understanding and agrees to plan. Patient discharged home in no acute distress and stable vitals  Co morbidities that complicate the patient evaluation  asthma   Social Determinants of Health:  Tobacco use  Test / Admission - Considered:  CTA chest; however suspicion for PE is low         Final Clinical Impression(s) / ED Diagnoses Final diagnoses:  Acute cough  Mild intermittent asthma with exacerbation    Rx / DC Orders ED Discharge Orders          Ordered    benzonatate  (TESSALON) 100 MG capsule  Every 8 hours        10/22/23 1739    predniSONE (DELTASONE) 20 MG tablet  Daily        10/22/23 1739              Jesusita Oka 10/22/23 1740    Laurence Spates, MD 10/23/23 1554

## 2023-10-23 NOTE — Progress Notes (Signed)
Right knee x-ray shows arthritis

## 2023-11-07 ENCOUNTER — Inpatient Hospital Stay: Admission: RE | Admit: 2023-11-07 | Payer: BC Managed Care – PPO | Source: Ambulatory Visit

## 2024-02-14 ENCOUNTER — Emergency Department (HOSPITAL_COMMUNITY)

## 2024-02-14 ENCOUNTER — Ambulatory Visit (INDEPENDENT_AMBULATORY_CARE_PROVIDER_SITE_OTHER): Admitting: Family Medicine

## 2024-02-14 ENCOUNTER — Other Ambulatory Visit: Payer: Self-pay

## 2024-02-14 ENCOUNTER — Emergency Department (HOSPITAL_COMMUNITY)
Admission: EM | Admit: 2024-02-14 | Discharge: 2024-02-14 | Disposition: A | Discharge status: Home / Self Care | Attending: Emergency Medicine | Admitting: Emergency Medicine

## 2024-02-14 ENCOUNTER — Encounter: Payer: Self-pay | Admitting: Family Medicine

## 2024-02-14 ENCOUNTER — Encounter (HOSPITAL_COMMUNITY): Payer: Self-pay | Admitting: Emergency Medicine

## 2024-02-14 VITALS — BP 118/82 | HR 66 | Ht 65.0 in | Wt 193.0 lb

## 2024-02-14 DIAGNOSIS — M25562 Pain in left knee: Secondary | ICD-10-CM

## 2024-02-14 DIAGNOSIS — M130 Polyarthritis, unspecified: Secondary | ICD-10-CM | POA: Diagnosis not present

## 2024-02-14 DIAGNOSIS — M25561 Pain in right knee: Secondary | ICD-10-CM | POA: Insufficient documentation

## 2024-02-14 DIAGNOSIS — M25512 Pain in left shoulder: Secondary | ICD-10-CM | POA: Diagnosis not present

## 2024-02-14 DIAGNOSIS — G8929 Other chronic pain: Secondary | ICD-10-CM | POA: Diagnosis not present

## 2024-02-14 DIAGNOSIS — S82001A Unspecified fracture of right patella, initial encounter for closed fracture: Secondary | ICD-10-CM | POA: Diagnosis not present

## 2024-02-14 DIAGNOSIS — M25461 Effusion, right knee: Secondary | ICD-10-CM | POA: Diagnosis not present

## 2024-02-14 DIAGNOSIS — M2550 Pain in unspecified joint: Secondary | ICD-10-CM

## 2024-02-14 DIAGNOSIS — R768 Other specified abnormal immunological findings in serum: Secondary | ICD-10-CM | POA: Diagnosis not present

## 2024-02-14 DIAGNOSIS — M1712 Unilateral primary osteoarthritis, left knee: Secondary | ICD-10-CM | POA: Diagnosis not present

## 2024-02-14 DIAGNOSIS — M75102 Unspecified rotator cuff tear or rupture of left shoulder, not specified as traumatic: Secondary | ICD-10-CM | POA: Diagnosis not present

## 2024-02-14 MED ORDER — OXYCODONE-ACETAMINOPHEN 5-325 MG PO TABS
2.0000 | ORAL_TABLET | Freq: Once | ORAL | Status: AC
  Administered 2024-02-14: 2 via ORAL
  Filled 2024-02-14: qty 2

## 2024-02-14 MED ORDER — OXYCODONE-ACETAMINOPHEN 5-325 MG PO TABS: 1.0000 | ORAL_TABLET | ORAL | 0 refills | Status: DC | PRN

## 2024-02-14 MED ORDER — KETOROLAC TROMETHAMINE 60 MG/2ML IM SOLN
60.0000 mg | Freq: Once | INTRAMUSCULAR | Status: AC
  Administered 2024-02-14: 60 mg via INTRAMUSCULAR

## 2024-02-14 MED ORDER — PREDNISONE 10 MG (48) PO TBPK: ORAL_TABLET | Freq: Every day | ORAL | 0 refills | Status: DC

## 2024-02-14 NOTE — ED Provider Notes (Signed)
 [facility]ENCY DEPT [facility] Emergency Department Provider Note MRN:  [id number]  Arrival date & time: 02/14/24     Chief Complaint   Pain   History of Present Illness   [name] is a 48 y.o. year-old female presents to the ED with chief complaint of bilateral knee pain and right shoulder pain that has been ongoing for a while.  She reports history of arthroscopic surgery of her knee.  She denies any new injury or trauma.  States that it hurts when she walks.  History provided by patient.   Review of Systems  Pertinent positive and negative review of systems noted in HPI.    Physical Exam   Vitals:   02/14/24 0318  BP: (!) 152/94  Pulse: 67  Resp: 18  Temp: 98.1 F (36.7 C)  SpO2: 100%    CONSTITUTIONAL:  non toxic-appearing, NAD NEURO:  Alert and oriented x 3, CN 3-12 grossly intact EYES:  eyes equal and reactive ENT/NECK:  Supple, no stridor  CARDIO:  appears well-perfused  PULM:  No respiratory distress,  GI/GU:  non-distended,  MSK/SPINE:  No gross deformities, no edema, moves all extremities, no focal tenderness of bilateral knees SKIN:  no rash, atraumatic   *Additional and/or pertinent findings included in MDM below  Diagnostic and Interventional Summary    EKG Interpretation Date/Time:    Ventricular Rate:    PR Interval:    QRS Duration:    QT Interval:    QTC Calculation:   R Axis:      Text Interpretation:         Labs Reviewed - No data to display  DG Shoulder Left  Final Result    DG Knee Complete 4 Views Right  Final Result    DG Knee Complete 4 Views Left  Final Result      Medications  oxyCODONE-acetaminophen (PERCOCET/ROXICET) 5-325 MG per tablet 2 tablet (has no administration in time range)     Procedures  /  Critical Care Procedures  ED Course and Medical Decision Making  I have reviewed the triage vital signs, the nursing notes, and pertinent available records from the EMR.  Social Determinants  Affecting Complexity of Care: Patient has no clinically significant social determinants affecting this chief complaint..   ED Course:    Medical Decision Making Patient presents with injury to knee pain.  DDx includes, fracture, strain, or sprain.  Consultants: none  Plain films reveal arthritic changes.  Pt advised to follow up with PCP and/or orthopedics. Patient given knee sleeve while in ED, conservative therapy such as RICE recommended and discussed.   Patient will be discharged home & is agreeable with above plan. Returns precautions discussed. Pt appears safe for discharge.   Amount and/or Complexity of Data Reviewed Radiology: ordered.  Risk Prescription drug management.         Consultants: No consultations were needed in caring for this patient.   Treatment and Plan: Emergency department workup does not suggest an emergent condition requiring admission or immediate intervention beyond  what has been performed at this time. The patient is safe for discharge and has  been instructed to return immediately for worsening symptoms, change in  symptoms or any other concerns    Final Clinical Impressions(s) / ED Diagnoses     ICD-10-CM   1. Polyarthralgia  M25.50       ED Discharge Orders     None         Discharge Instructions Discussed with  and Provided to Patient:     Discharge Instructions      The x-ray of your shoulder was normal.  The x-rays of your knees showed arthritic changes.  I recommend that you follow-up with orthopedics for further evaluation.         Roxy Horseman, PA-C 02/14/24 4034    Nicanor Alcon, April, MD 02/14/24 [phone]

## 2024-02-14 NOTE — Patient Instructions (Signed)
 Thank you for coming in today.   You received an injection today. Seek immediate medical attention if the joint becomes red, extremely painful, or is oozing fluid.   A referral has been placed to [facility].   See you back as needed.

## 2024-02-14 NOTE — ED Triage Notes (Signed)
 Patient c/o chronic pain to left shoulder, and bilateral knees.  Patient reports breaking right knee last month and has been following up with PCP for that, and left knee pain that she reports is "arthritis."  Patient states the pain is causing her not to be able to sleep.

## 2024-02-14 NOTE — Discharge Instructions (Addendum)
 The x-ray of your shoulder was normal.  The x-rays of your knees showed arthritic changes.  I recommend that you follow-up with orthopedics for further evaluation.

## 2024-02-14 NOTE — Progress Notes (Unsigned)
 I, [name], CMA acting as a scribe for Carrie Graham, MD.  [name] is a 48 y.o. female who presents to [facility] today for bilat knee pain. Pt was last seen by Dr. Denyse [name] on 10/16/23 for her R knee. Pt was seen earlier today at the [facility] ED.  Today, pt reports worsening knee pain x 1 week. Locates left knee pain to posterior aspect radiating into the calf. Locates right knee pain to entire joint, swelling present, TTP. Swelling also present in left knee. Mechanical sx present bilaterally. Has tried topical Voltaren, elevating, massage, heat, ice, and Tylenol with no relief.   Also c/o left shoulder pain. Locates pain to lateral aspect radiating into the deltoid. Limited ROM. Denies n/t/w. Hands fele tight. Also having left-sided neck pain. Sx worse when laying down, any position.    Patient did have a rheumatologic workup in 2023 showing very elevated ANA.  She did have 1 visit with rheumatology where further labs were completed and they were considering starting disease modifying antirheumatic's.  She was somehow lost to follow-up and is not taking any medications.  She does have intermittent flareups of overall polyarthralgias and myalgias.  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   Exam:  BP 118/82   Pulse 66   Ht 5\' 5"  (1.651 m)   Wt 193 lb (87.5 kg)   LMP 06/18/2021 (Approximate)   SpO2 98%   BMI 32.12 kg/m  General: Well Developed, well nourished, and in no acute distress.   MSK: Mild effusion bilateral knees.  Normal-appearing left shoulder.  Mild synovitis hands bilaterally.  No rash.    Lab and Radiology Results No results found for this or any previous visit (from the past 72 hours). DG Knee Complete 4 Views Left Result Date:  02/14/2024 CLINICAL DATA:  Chronic knee pain. EXAM: LEFT KNEE - COMPLETE 4+ VIEW COMPARISON:  None Available. FINDINGS: Mild degenerative changes with medial compartment narrowing and small medial and patellofemoral compartment osteophyte formation. No evidence of acute fracture or dislocation. No focal bone lesion or bone destruction. No significant effusions. Soft tissues are unremarkable. IMPRESSION: Mild degenerative changes in the left knee. No acute bony abnormalities. Electronically Signed   By: Burman Nieves M.D.   On: 02/14/2024 03:55   DG Knee Complete 4 Views Right Result Date: 02/14/2024 CLINICAL DATA:  Chronic knee pain.  Knee fracture in November. EXAM: RIGHT KNEE - COMPLETE 4+ VIEW COMPARISON:  10/02/2023 FINDINGS: Degenerative changes in the left knee with medial and lateral compartment narrowing and prominent tricompartment osteophyte formation. No evidence of acute fracture or dislocation. No focal bone lesion or bone destruction. Small effusion. Soft tissues are otherwise unremarkable. IMPRESSION: Moderate degenerative changes in the right knee. Small effusion. No acute bony abnormalities. Electronically Signed   By: Burman Nieves M.D.   On: 02/14/2024 03:55   DG Shoulder Left Result Date: 02/14/2024 CLINICAL DATA:  Left shoulder pain for several months. History of rotator cuff tear. EXAM: LEFT SHOULDER - 2+ VIEW COMPARISON:  None Available. FINDINGS: There is no evidence of fracture or dislocation. There is no evidence of arthropathy or other focal bone abnormality. Soft tissues are unremarkable. IMPRESSION: Negative. Electronically Signed   By: Burman Nieves M.D.   On: 02/14/2024 03:53  I, [name], personally (independently) visualized and performed the interpretation of the images attached in this note.      Assessment and Plan: 48 y.o. female with bilateral knee pain and left shoulder pain occurring in the setting of polyarthralgias and a history of very elevated ANA.   I am pretty convinced that [name] has a rheumatologic disorder causing these episodes of pain.  She does have osteoarthritis that could be contributory as well.  We discussed options plan to refer back to rheumatology and give her a 12-day to bring course of prednisone.  Also use limited oxycodone for pain control and give a Toradol shot in clinic today.  She will keep me updated.   PDMP reviewed during this encounter. Orders Placed This Encounter  Procedures   [place] LIMITED JOINT SPACE STRUCTURES LOW BILAT(NO LINKED CHARGES)    Reason for Exam (SYMPTOM  OR DIAGNOSIS REQUIRED):   bilat knee pain    Preferred imaging location?:    [facility] referral to Rheumatology    Referral Priority:   Routine    Referral Type:   Consultation    Referral Reason:   Specialty Services Required    Requested Specialty:   Rheumatology    Number of Visits Requested:   1   Meds ordered this encounter  Medications   predniSONE (STERAPRED UNI-PAK 48 TAB) 10 MG (48) TBPK tablet    Sig: Take by mouth daily. 12 day dosepack po    Dispense:  48 tablet    Refill:  0   oxyCODONE-acetaminophen (PERCOCET/ROXICET) 5-325 MG tablet    Sig: Take 1 tablet by mouth every 4 (four) hours as needed for severe pain (pain score 7-10).    Dispense:  15 tablet    Refill:  0   ketorolac (TORADOL) injection 60 mg     Discussed warning signs or symptoms. Please see discharge instructions. Patient expresses understanding.   The above documentation has been reviewed and is accurate and complete [name], M.D.

## 2024-03-20 ENCOUNTER — Telehealth: Payer: Self-pay

## 2024-03-20 DIAGNOSIS — M79672 Pain in left foot: Secondary | ICD-10-CM | POA: Diagnosis not present

## 2024-03-20 DIAGNOSIS — R768 Other specified abnormal immunological findings in serum: Secondary | ICD-10-CM | POA: Diagnosis not present

## 2024-03-20 DIAGNOSIS — M79641 Pain in right hand: Secondary | ICD-10-CM | POA: Diagnosis not present

## 2024-03-20 DIAGNOSIS — M549 Dorsalgia, unspecified: Secondary | ICD-10-CM | POA: Diagnosis not present

## 2024-03-20 DIAGNOSIS — M25562 Pain in left knee: Secondary | ICD-10-CM | POA: Diagnosis not present

## 2024-03-20 DIAGNOSIS — M79671 Pain in right foot: Secondary | ICD-10-CM | POA: Diagnosis not present

## 2024-03-20 DIAGNOSIS — M1990 Unspecified osteoarthritis, unspecified site: Secondary | ICD-10-CM | POA: Diagnosis not present

## 2024-03-20 DIAGNOSIS — M3505 Sjogren syndrome with inflammatory arthritis: Secondary | ICD-10-CM | POA: Diagnosis not present

## 2024-03-20 DIAGNOSIS — M2550 Pain in unspecified joint: Secondary | ICD-10-CM | POA: Diagnosis not present

## 2024-03-20 DIAGNOSIS — M791 Myalgia, unspecified site: Secondary | ICD-10-CM | POA: Diagnosis not present

## 2024-03-20 DIAGNOSIS — M79642 Pain in left hand: Secondary | ICD-10-CM | POA: Diagnosis not present

## 2024-03-20 DIAGNOSIS — M25561 Pain in right knee: Secondary | ICD-10-CM | POA: Diagnosis not present

## 2024-03-20 NOTE — Telephone Encounter (Signed)
 Copied from CRM [id number]. Topic: Clinical - Medication Question >> Mar 20, 2024  1:16 PM Dyann Glaser G wrote: Reason for CRM: PT STATED SHE IS NOT ON THE INJECTION ANY LONGER AND SHE IS RELEASED FROM THE HEART DOCTOR AND WANT TO SEE ABOUT GETTING HER LICENSE BACK TO DRIVE. SHE CAN BE REACHED AT [phone]. ALSO REQUESTING A HANDICAP STICKER ALSO

## 2024-03-21 LAB — LAB REPORT - SCANNED
Creatinine, POC: 130.5 mg/dL
EGFR: 94

## 2024-03-21 NOTE — Telephone Encounter (Signed)
 I have placed this the form inside your box to sign

## 2024-03-21 NOTE — Telephone Encounter (Signed)
 Pt has not been seen by you in 3 years

## 2024-03-22 NOTE — Telephone Encounter (Signed)
 I cannot fill out without visit

## 2024-03-29 ENCOUNTER — Encounter (HOSPITAL_COMMUNITY): Payer: Self-pay

## 2024-03-29 ENCOUNTER — Other Ambulatory Visit: Payer: Self-pay | Admitting: Medical Genetics

## 2024-04-01 ENCOUNTER — Inpatient Hospital Stay: Admission: RE | Admit: 2024-04-01 | Source: Ambulatory Visit

## 2024-04-01 ENCOUNTER — Ambulatory Visit
Admission: RE | Admit: 2024-04-01 | Discharge: 2024-04-01 | Disposition: A | Discharge status: Home / Self Care | Source: Ambulatory Visit | Attending: Family Medicine | Admitting: Family Medicine

## 2024-04-01 DIAGNOSIS — S82091A Other fracture of right patella, initial encounter for closed fracture: Secondary | ICD-10-CM

## 2024-04-01 DIAGNOSIS — M25561 Pain in right knee: Secondary | ICD-10-CM

## 2024-04-03 ENCOUNTER — Ambulatory Visit: Payer: Self-pay | Admitting: Family Medicine

## 2024-04-03 ENCOUNTER — Ambulatory Visit: Admitting: Family Medicine

## 2024-04-03 ENCOUNTER — Other Ambulatory Visit: Payer: Self-pay

## 2024-04-03 VITALS — BP 130/84 | HR 83 | Ht 65.0 in

## 2024-04-03 DIAGNOSIS — M25561 Pain in right knee: Secondary | ICD-10-CM | POA: Diagnosis not present

## 2024-04-03 DIAGNOSIS — M7122 Synovial cyst of popliteal space [Baker], left knee: Secondary | ICD-10-CM

## 2024-04-03 DIAGNOSIS — G8929 Other chronic pain: Secondary | ICD-10-CM | POA: Diagnosis not present

## 2024-04-03 DIAGNOSIS — M7121 Synovial cyst of popliteal space [Baker], right knee: Secondary | ICD-10-CM | POA: Diagnosis not present

## 2024-04-03 DIAGNOSIS — M25562 Pain in left knee: Secondary | ICD-10-CM

## 2024-04-03 DIAGNOSIS — M25462 Effusion, left knee: Secondary | ICD-10-CM | POA: Diagnosis not present

## 2024-04-03 MED ORDER — KETOROLAC TROMETHAMINE 60 MG/2ML IM SOLN
60.0000 mg | Freq: Once | INTRAMUSCULAR | Status: DC
Start: 2024-04-03 — End: 2024-04-03

## 2024-04-03 MED ORDER — KETOROLAC TROMETHAMINE 30 MG/ML IJ SOLN
60.0000 mg | Freq: Once | INTRAMUSCULAR | Status: AC
Start: 2024-04-03 — End: 2024-04-03
  Administered 2024-04-03: 60 mg via INTRAMUSCULAR

## 2024-04-03 MED ORDER — AMBULATORY NON FORMULARY MEDICATION: 0 refills | Status: AC

## 2024-04-03 MED ORDER — AMBULATORY NON FORMULARY MEDICATION: 0 refills | Status: DC

## 2024-04-03 NOTE — Telephone Encounter (Signed)
 Called and spoke with patient, has appt scheduled for today at 1:30 PM.

## 2024-04-03 NOTE — Progress Notes (Unsigned)
 Joanna Muck, PhD, LAT, ATC acting as a scribe for Garlan Juniper, MD.  [name] is a 48 y.o. female who presents to [facility] at [facility] today for f/u polyarthralgia and bilat knee pain w/ MRI review. Pt was last seen by Dr. Alease Hunter on 02/14/24 and was given a Toradol  IM injection and she was referred back to rheum.  Today, pt reports both knees are very painful. She is having to use crutches to ambulate and some days can't walk. She has been seen by rheum and started on prednisone  and hydrochlorothiazide.   Dx imaging: 04/01/24 R knee MRI 02/14/24 R & L knee and L shoulder XR  10/02/23 R knee XR 09/13/22 Labs  Pertinent review of systems: No fevers or chills  Relevant historical information: Convulsions.   Exam:  BP 130/84   Pulse 83   Ht 5\' 5"  (1.651 m)   LMP 06/18/2021 (Approximate)   SpO2 97%   BMI 32.12 kg/m  General: Well Developed, well nourished, and in no acute distress.   MSK: Knees bilaterally moderate effusion.  Decreased motion. Palpable synovial cysts posterior medial bilateral knees.    Lab and Radiology Results  Procedure: Real-time Ultrasound Guided aspiration and injection of left knee Baker's cyst Device: Philips Affiniti 50G/GE Logiq Images permanently stored and available for review in PACS Verbal informed consent obtained.  Discussed risks and benefits of procedure. Warned about infection, bleeding, hyperglycemia damage to structures among others. Patient expresses understanding and agreement Time-out conducted.   Noted no overlying erythema, induration, or other signs of local infection.   Skin prepped in a sterile fashion.   Local anesthesia: Topical Ethyl chloride.   With sterile technique and under real time ultrasound guidance:   2 mL of lidocaine injected achieving good anesthesia. Skin was again sterilized with isopropyl alcohol. 18-gauge needle was used to access the cyst.  20 mL of slightly cloudy yellow fluid  was aspirated. Syringe was exchanged and 40 mg of Kenalog  and 2 mL of Marcaine were injected into the now decompressed Baker's cyst. Completed without difficulty   Pain immediately resolved suggesting accurate placement of the medication.   Advised to call if fevers/chills, erythema, induration, drainage, or persistent bleeding.   Images permanently stored and available for review in the ultrasound unit.  Impression: Technically successful ultrasound guided injection.    Procedure: Real-time Ultrasound Guided aspiration and injection of right knee Baker's cyst Device: Philips Affiniti 50G/GE Logiq Images permanently stored and available for review in PACS Verbal informed consent obtained.  Discussed risks and benefits of procedure. Warned about infection, bleeding, hyperglycemia damage to structures among others. Patient expresses understanding and agreement Time-out conducted.   Noted no overlying erythema, induration, or other signs of local infection.   Skin prepped in a sterile fashion.   Local anesthesia: Topical Ethyl chloride.   With sterile technique and under real time ultrasound guidance:  2 mL of lidocaine injected in subcutaneous tissue achieving good anesthesia. Skin was again sterilized with isopropyl alcohol. 10 mL of slightly cloudy yellow fluid was aspirated  Syringe was exchanged and 40 mg of Kenalog  and 2 mL of Marcaine were injected into the now decompressed Baker's cyst. Completed without difficulty   Pain immediately resolved suggesting accurate placement of the medication.   Advised to call if fevers/chills, erythema, induration, drainage, or persistent bleeding.   Images permanently stored and available for review in the ultrasound unit.  Impression: Technically successful ultrasound guided injection.   Fluid from  left Baker's cyst was sent for cell count differential and culture listed below.   Results for orders placed or performed in visit on 04/03/24  (from the past 72 hours)  Synovial Fluid Analysis, Complete     Status: Abnormal   Collection Time: 04/03/24  2:03 PM  Result Value Ref Range   Site NOT GIVEN    Color, Synovial YELLOW STRAW/YELLOW   Appearance-Synovial CLOUDY (A) CLEAR/HAZY   WBC, Synovial 16,440 (H) <150 cells/uL   Neutrophil, Synovial 78 (H) 0 - 24 %   Lymphocytes-Synovial Fld 4 0 - 74 %   Monocyte/Macrophage 18 0 - 69 %   Eosinophils-Synovial 0 0 - 2 %   Basophils, % 0 0 %   Synoviocytes, % 0 0 - 15 %   Crystals, Fluid  NONE SEEN /HPF    Comment: None seen  Anaerobic and Aerobic Culture     Status: None (Preliminary result)   Collection Time: 04/03/24  2:03 PM   Specimen: Synovial, Right Knee; Synovial Fluid  Result Value Ref Range   MICRO NUMBER: [id number]    SPECIMEN QUALITY: Adequate    SOURCE: FLUID, SYNOVIAL    STATUS: PRELIMINARY    AER RESULT: No growth to date    MR Knee Right Wo Contrast Result Date: 04/02/2024 MR KNEE WITHOUT IV CONTRAST RIGHT COMPARISON: X-ray 02/14/2024 CLINICAL HISTORY: Knee trauma. PULSE SEQUENCES: Ax PD FS, Sag T2 ACL, Sag PD FS, Cor PD FS & COR T1 FINDINGS: Bones: There is mild tricompartmental osteoarthrosis with osteophyte formation and chondromalacia. No fracture or. There is a large Lateral translation patella. Extensor mechanism is. Baker's cyst is present. Ligaments: The ACL, PCL, MCL and fibular collateral ligament are intact. Menisci: There is abnormal signal in the anterior horn and anterior body of the lateral meniscus likely reflective of a tear. No displaced meniscal fragment is identified. Medial meniscus is unremarkable. IMPRESSION: Tricompartmental osteoarthrosis with chondromalacia and moderate osteophyte formation. There is a large reactive joint effusion. No fracture. Abnormal signal in the anterior horn and body of the lateral meniscus likely reflective of a tear. No displaced meniscal flap is identified. Ligaments are intact. Electronically signed by: Adrien Alberta MD  04/02/2024 10:12 AM EDT RP Workstation: [id number]   Dione Franks, personally (independently) visualized and performed the interpretation of the images attached in this note.     Assessment and Plan: 48 y.o. female with bilateral knee pain and swelling.  Her MRI does not explain her degree of pain and swelling well enough.  She has been seen by rheumatology and is thought to have either lupus or mixed connective tissue disease.  She is currently treated with Plaquenil and prednisone  which so far has not helped very much.  Plan to continue rheumatologic evaluation and treatment strategies and will proceed with a knee fluid analysis.  Toradol  shot given prior to discharge.  Additionally prescribed a rollator.   PDMP reviewed during this encounter. Orders Placed This Encounter  Procedures   Anaerobic and Aerobic Culture    Synovial fluid    Standing Status:   Future    Number of Occurrences:   1    Expiration Date:   04/03/2025   US  LIMITED JOINT SPACE STRUCTURES LOW BILAT(NO LINKED CHARGES)    Reason for Exam (SYMPTOM  OR DIAGNOSIS REQUIRED):   bilateral knee pain    Preferred imaging location?:   [facility]   Synovial Fluid Analysis, Complete    Synovial fluid    Standing Status:  Future    Number of Occurrences:   1    Expiration Date:   04/03/2025   Meds ordered this encounter  Medications   DISCONTD: ketorolac  (TORADOL ) injection 60 mg   ketorolac  (TORADOL ) 30 MG/ML injection 60 mg   DISCONTD: AMBULATORY NON FORMULARY MEDICATION    Sig: Rolling Walker Dispense 1 Dx code: M25.561, M25.562, G89.29 Use as needed    Dispense:  1 Device    Refill:  0   AMBULATORY NON FORMULARY MEDICATION    Sig: rolater with seat and brakes Dispense 1 Dx code: M25.561, M25.562, G89.29 Use as needed    Dispense:  1 Device    Refill:  0     Discussed warning signs or symptoms. Please see discharge instructions. Patient expresses understanding.   The above  documentation has been reviewed and is accurate and complete Garlan Juniper, M.D.

## 2024-04-03 NOTE — Progress Notes (Signed)
 Right knee MRI shows arthritis and a possible meniscus tear.  The knee joint is swollen.  Recommend return to clinic to go over the results in full detail I discussed treatment plan and options including possible cortisone shot in the knee.

## 2024-04-03 NOTE — Patient Instructions (Addendum)
 Thank you for coming in today.   You received an injection today. Seek immediate medical attention if the joint becomes red, extremely painful, or is oozing fluid.   Ketorolac  60 mg given intra-muscular today.

## 2024-04-04 ENCOUNTER — Ambulatory Visit: Payer: Self-pay | Admitting: Family Medicine

## 2024-04-04 DIAGNOSIS — M7122 Synovial cyst of popliteal space [Baker], left knee: Secondary | ICD-10-CM | POA: Insufficient documentation

## 2024-04-04 DIAGNOSIS — M7121 Synovial cyst of popliteal space [Baker], right knee: Secondary | ICD-10-CM | POA: Insufficient documentation

## 2024-04-04 NOTE — Progress Notes (Signed)
 Cell count differential looks okay.  No evidence of gout or pseudogout.  Culture is still pending.

## 2024-04-05 ENCOUNTER — Encounter: Payer: Self-pay | Admitting: Internal Medicine

## 2024-04-05 ENCOUNTER — Ambulatory Visit: Admitting: Internal Medicine

## 2024-04-05 VITALS — BP 122/80 | HR 64 | Temp 98.1°F | Ht 65.0 in | Wt 193.0 lb

## 2024-04-05 DIAGNOSIS — F17210 Nicotine dependence, cigarettes, uncomplicated: Secondary | ICD-10-CM | POA: Diagnosis not present

## 2024-04-05 DIAGNOSIS — G43109 Migraine with aura, not intractable, without status migrainosus: Secondary | ICD-10-CM | POA: Diagnosis not present

## 2024-04-05 DIAGNOSIS — D696 Thrombocytopenia, unspecified: Secondary | ICD-10-CM | POA: Diagnosis not present

## 2024-04-05 DIAGNOSIS — M170 Bilateral primary osteoarthritis of knee: Secondary | ICD-10-CM

## 2024-04-05 DIAGNOSIS — D649 Anemia, unspecified: Secondary | ICD-10-CM

## 2024-04-05 DIAGNOSIS — E66811 Obesity, class 1: Secondary | ICD-10-CM | POA: Diagnosis not present

## 2024-04-05 DIAGNOSIS — J4520 Mild intermittent asthma, uncomplicated: Secondary | ICD-10-CM

## 2024-04-05 DIAGNOSIS — R569 Unspecified convulsions: Secondary | ICD-10-CM

## 2024-04-05 DIAGNOSIS — Z6832 Body mass index (BMI) 32.0-32.9, adult: Secondary | ICD-10-CM

## 2024-04-05 MED ORDER — HYDROXYCHLOROQUINE SULFATE 200 MG PO TABS: 400.0000 mg | ORAL_TABLET | Freq: Every day | ORAL | 0 refills | Status: DC

## 2024-04-05 MED ORDER — AMLODIPINE BESYLATE 5 MG PO TABS: 5.0000 mg | ORAL_TABLET | Freq: Every day | ORAL | 3 refills | Status: DC

## 2024-04-05 NOTE — Patient Instructions (Addendum)
 We will get the records to see if the labs are up to date.  We have sent in the short supply of the hydroxychloroquine to your pharmacy. Insurance may not cover this depending on if it was filled within a month.   We have given you the handicapped placard and if you can get the medical form from the [facility] typically this is needed specifically for you so that we can fill that out with driving privileges.

## 2024-04-05 NOTE — Assessment & Plan Note (Signed)
 Still smoking 1PPD and counseled to quit.

## 2024-04-05 NOTE — Assessment & Plan Note (Signed)
 Uses allergy meds and albuterol  prn and no flare today.

## 2024-04-05 NOTE — Assessment & Plan Note (Signed)
 Will get records from rheumatology to see if this is still present. She is no longer having periods any longer so may be resolved. Colonoscopy up to date.

## 2024-04-05 NOTE — Assessment & Plan Note (Signed)
 With co-morbidities.

## 2024-04-05 NOTE — Progress Notes (Signed)
   Subjective:   Patient ID: [name], female    DOB: 21-Jun-1976, 48 y.o.   MRN: [id number]  HPI The patient is a 48 YO female coming in for visit (last seen by PCP 2022). Having knee problems and sports medicine did injection 2 days ago and rx rollator. 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 She is unclear. She is having a lot of knee pain and thinks she needs replacement. Being treated by rheumatology for maybe lupus or some ct disease unsure taking prednisone  and plaquenil. Needs short term plaquenil she left it while traveling. She gets labs through rheum and thinks they check "everything"   PMH, [facility], social history reviewed and updated  Review of Systems  Constitutional: Negative.   HENT: Negative.    Eyes: Negative.   Respiratory:  Negative for cough, chest tightness and shortness of breath.   Cardiovascular:  Negative for chest pain, palpitations and leg swelling.  Gastrointestinal:  Negative for abdominal distention, abdominal pain, constipation, diarrhea, nausea and vomiting.  Musculoskeletal:  Positive for arthralgias, gait problem and myalgias.  Skin: Negative.   Psychiatric/Behavioral: Negative.      Objective:  Physical Exam Constitutional:      Appearance: She is well-developed. She is obese.  HENT:     Head: Normocephalic and atraumatic.  Cardiovascular:     Rate and Rhythm: Normal rate and regular rhythm.  Pulmonary:     Effort: Pulmonary effort is normal. No respiratory distress.     Breath sounds: Normal breath sounds. No wheezing or rales.  Abdominal:     General: Bowel sounds are normal. There is no distension.     Palpations: Abdomen is soft.     Tenderness: There is no abdominal tenderness. There is no rebound.  Musculoskeletal:        General: Tenderness present.     Cervical back: Normal range of motion.  Skin:    General: Skin is warm and dry.  Neurological:     Mental Status: She is alert and oriented  to person, place, and time.     Coordination: Coordination abnormal.     Comments: walker     Vitals:   04/05/24 1025  BP: 122/80  Pulse: 64  Temp: 98.1 F (36.7 C)  TempSrc: Oral  SpO2: 99%  Weight: 193 lb (87.5 kg)  Height: 5\' 5"  (1.651 m)    Assessment & Plan:

## 2024-04-05 NOTE — Assessment & Plan Note (Signed)
 Having pain significant but improved after injection. States she was recommended knee replacement.

## 2024-04-05 NOTE — Assessment & Plan Note (Signed)
 Will obtain records from rheumatology about recent values.

## 2024-04-05 NOTE — Assessment & Plan Note (Signed)
 Not seeing neurology any longer and not on controller medicine. Was taking emgalitiy in the past.

## 2024-04-05 NOTE — Assessment & Plan Note (Signed)
 Very dubious history and neurology did not assess this although she saw them after we had addressed this. She did not get repeat CT head after this event. She attributes this to anxiety. She is unsure if this is related to revokation of driving privileges.

## 2024-04-08 ENCOUNTER — Other Ambulatory Visit (HOSPITAL_COMMUNITY)
Admission: RE | Admit: 2024-04-08 | Discharge: 2024-04-08 | Disposition: A | Discharge status: Home / Self Care | Payer: Self-pay | Source: Ambulatory Visit | Attending: Oncology | Admitting: Oncology

## 2024-04-09 LAB — ANAEROBIC AND AEROBIC CULTURE
AER RESULT:: NO GROWTH
MICRO NUMBER:: 16454639
MICRO NUMBER:: 16454640
SPECIMEN QUALITY:: ADEQUATE
SPECIMEN QUALITY:: ADEQUATE

## 2024-04-09 LAB — SYNOVIAL FLUID ANALYSIS, COMPLETE
Basophils, %: 0 %
Eosinophils-Synovial: 0 % (ref 0–2)
Lymphocytes-Synovial Fld: 4 % (ref 0–74)
Monocyte/Macrophage: 18 % (ref 0–69)
Neutrophil, Synovial: 78 % — ABNORMAL HIGH (ref 0–24)
Synoviocytes, %: 0 % (ref 0–15)
WBC, Synovial: 16440 {cells}/uL — ABNORMAL HIGH (ref <150)

## 2024-04-09 NOTE — Progress Notes (Signed)
 Culture shows no sign of infection.

## 2024-04-12 ENCOUNTER — Other Ambulatory Visit: Payer: Self-pay

## 2024-04-12 ENCOUNTER — Ambulatory Visit: Payer: Self-pay

## 2024-04-12 ENCOUNTER — Encounter (HOSPITAL_COMMUNITY): Payer: Self-pay | Admitting: *Deleted

## 2024-04-12 ENCOUNTER — Ambulatory Visit (HOSPITAL_COMMUNITY)
Admission: EM | Admit: 2024-04-12 | Discharge: 2024-04-12 | Disposition: A | Discharge status: Home / Self Care | Attending: Emergency Medicine | Admitting: Emergency Medicine

## 2024-04-12 DIAGNOSIS — M25462 Effusion, left knee: Secondary | ICD-10-CM | POA: Diagnosis not present

## 2024-04-12 MED ORDER — DEXAMETHASONE SODIUM PHOSPHATE 10 MG/ML IJ SOLN
INTRAMUSCULAR | Status: AC
  Filled 2024-04-12: qty 1

## 2024-04-12 MED ORDER — DEXAMETHASONE SODIUM PHOSPHATE 10 MG/ML IJ SOLN
10.0000 mg | Freq: Once | INTRAMUSCULAR | Status: AC
  Administered 2024-04-12: 10 mg via INTRAMUSCULAR

## 2024-04-12 MED ORDER — PREDNISONE 20 MG PO TABS: 40.0000 mg | ORAL_TABLET | Freq: Every day | ORAL | 0 refills | Status: AC

## 2024-04-12 NOTE — ED Triage Notes (Signed)
 PT reports Lt knee pain.

## 2024-04-12 NOTE — Telephone Encounter (Signed)
  Chief Complaint: recurrent left knee pain Symptoms: left knee pain and swelling Frequency: one week Pertinent Negatives: Patient denies  Disposition: [] ED /[x] Urgent Care (no appt availability in office) / [] Appointment(In office/virtual)/ []  [facility]/ [] Home Care/ [] Refused Recommended Disposition /[] [facility]/ []  Follow-up with PCP Additional Notes: pain too great to wait for appointment on 5/27. Will keep appointment, but is to to UC for intervention until then Copied from CRM [id number]. Topic: Clinical - Red Word Triage >> Apr 12, 2024  2:36 PM Luane Rumps D wrote: Red Word that prompted transfer to Nurse Triage: Swelling and tightness in left knee, pain 10/10. Reason for Disposition  [1] Very swollen joint AND [2] no fever  Answer Assessment - Initial Assessment Questions 1. LOCATION and RADIATION: "Where is the pain located?"      Left knee 2. QUALITY: "What does the pain feel like?"  (e.g., sharp, dull, aching, burning)     Hurts in front of knee when trying to bend 3. SEVERITY: "How bad is the pain?" "What does it keep you from doing?"   (Scale 1-10; or mild, moderate, severe)   -  MILD (1-3): doesn't interfere with normal activities    -  MODERATE (4-7): interferes with normal activities (e.g., work or school) or awakens from sleep, limping    -  SEVERE (8-10): excruciating pain, unable to do any normal activities, unable to walk     Using walker-10 4. ONSET: "When did the pain start?" "Does it come and go, or is it there all the time?"     2 days ago 5. RECURRENT: "Have you had this pain before?" If Yes, ask: "When, and what happened then?"     yes 6. SETTING: "Has there been any recent work, exercise or other activity that involved that part of the body?"      no 7. AGGRAVATING FACTORS: "What makes the knee pain worse?" (e.g., walking, climbing stairs, running)     activity 8. ASSOCIATED SYMPTOMS: "Is there any swelling or redness of the knee?"      swelling 9. OTHER SYMPTOMS: "Do you have any other symptoms?" (e.g., chest pain, difficulty breathing, fever, calf pain)     no  Protocols used: Knee Pain-A-AH

## 2024-04-12 NOTE — ED Provider Notes (Signed)
 [facility]    CSN: [id number] Arrival date & time: 04/12/24  1513      History   Chief Complaint Chief Complaint  Patient presents with   Knee Pain    HPI [name] is a 48 y.o. female.   Patient presents to clinic over concern of left knee warmth, pain and swelling.  On 5/14 she had the area drained and culture results were interpreted by sports medicine as no acute infection.  Diagnosed with synovial cyst of the left knee.  Has not had any fevers.  The area is warm, hot and swollen.  She does have difficulty ambulating and sports medicine prescribed her a rolling walker.  Is followed by rheumatology and is thought to have lupus or connective tissue disorder.  The history is provided by the patient and medical records.  Knee Pain   Past Medical History:  Diagnosis Date   Arthritis    Osto   Constipation    GERD (gastroesophageal reflux disease)    Hypertension    Iron deficiency anemia    Migraine    Migraine headache with aura 11/09/2020   Sarcoidosis    Vertigo     Patient Active Problem List   Diagnosis Date Noted   Baker's cyst of knee, right 04/04/2024   Non-seasonal allergic rhinitis due to pollen 05/31/2023   Intermittent asthma without complication 05/31/2023   Episodic recurrent vertigo 04/06/2022   Tobacco dependence due to cigarettes 02/06/2022   Inadequate sleep hygiene 02/06/2022   Morning headache 02/06/2022   Foot pain, bilateral 12/16/2021   Snoring 12/16/2021   Convulsions (HCC) 03/12/2021   Migraine headache with aura 11/09/2020   Degenerative arthritis of knee, bilateral 10/07/2019   Chronic pain of left ankle 04/13/2018   Vitamin D  deficiency 07/28/2016   Routine general medical examination at a health care facility 04/25/2016   Obesity 04/25/2016   Anemia 09/28/2014   Thrombocytopenia (HCC) 09/28/2014    Past Surgical History:  Procedure Laterality Date   CESAREAN SECTION     CESAREAN SECTION     KNEE SURGERY  Right    ACL   steroid shots     TUBAL LIGATION      OB History   No obstetric history on file.      Home Medications    Prior to Admission medications   Medication Sig Start Date End Date Taking? Authorizing Provider  AMBULATORY NON FORMULARY MEDICATION rolater with seat and brakes Dispense 1 Dx code: G95.621, P6380046, G89.29 Use as needed 04/03/24  Yes Syliva Even, MD  amLODipine  (NORVASC ) 5 MG tablet Take 1 tablet (5 mg total) by mouth daily. 04/05/24  Yes Adelia Homestead, MD  cetirizine  (ZYRTEC ) 5 MG tablet Take 1 tablet (5 mg total) by mouth daily. 05/31/23  Yes Dohmeier, Raoul Byes, MD  fluticasone  (FLONASE ) 50 MCG/ACT nasal spray Place 2 sprays into both nostrils daily. 10/20/23  Yes Reyes, Herold Lora, PA-C  hydroxychloroquine  (PLAQUENIL ) 200 MG tablet Take 2 tablets (400 mg total) by mouth daily. 04/05/24  Yes Adelia Homestead, MD  predniSONE  (DELTASONE ) 20 MG tablet Take 2 tablets (40 mg total) by mouth daily for 5 days. 04/12/24 04/17/24 Yes Fredie Majano  N, FNP  venlafaxine  XR (EFFEXOR -XR) 150 MG 24 hr capsule TAKE 1 CAPSULE BY MOUTH DAILY WITH BREAKFAST. PLEASE CALL AND MAKE OVERDUE APPT FOR FURTHER REFILLS 06/01/23  Yes Dohmeier, Raoul Byes, MD  Vitamin D , Ergocalciferol , (DRISDOL ) 1.25 MG (50000 UNIT) CAPS capsule TAKE 1 CAPSULE (50,000 UNITS TOTAL)  BY MOUTH EVERY 7 (SEVEN) DAYS 01/10/22  Yes Plotnikov, Oakley Bellman, MD  albuterol  (VENTOLIN  HFA) 108 (90 Base) MCG/ACT inhaler Inhale 1-2 puffs into the lungs every 6 (six) hours as needed for wheezing or shortness of breath. 10/20/23 11/19/23  Louvenia Roys, PA-C  rizatriptan  (MAXALT -MLT) 10 MG disintegrating tablet Take 1 tablet (10 mg total) by mouth as needed for migraine. May repeat in 2 hours if needed 05/31/23   Dohmeier, Raoul Byes, MD    Family History Family History  Problem Relation Age of Onset   Lupus Mother    Healthy Father    Thyroid  disease Sister    Anemia Sister    Heart failure Maternal Grandmother     Diabetes Maternal Grandmother    Hypertension Maternal Grandmother    Thyroid  disease Maternal Grandmother    Diabetes Maternal Grandfather    Diabetes Paternal Grandmother    Colon polyps Neg Hx    Colon cancer Neg Hx    Esophageal cancer Neg Hx    Stomach cancer Neg Hx    Rectal cancer Neg Hx     Social History Social History   Tobacco Use   Smoking status: Every Day    Current packs/day: 1.00    Average packs/day: 1 pack/day for 17.0 years (17.0 ttl pk-yrs)    Types: Cigarettes   Smokeless tobacco: Never  Vaping Use   Vaping status: Never Used  Substance Use Topics   Alcohol use: Not Currently   Drug use: No     Allergies   Fish allergy, Naproxen, Other, Sulfa antibiotics, Valium  [diazepam ], Asa [aspirin], and Topamax  [topiramate ]   Review of Systems Review of Systems  Per HPI  Physical Exam Triage Vital Signs ED Triage Vitals  Encounter Vitals Group     BP 04/12/24 1552 (!) 133/90     Systolic BP Percentile --      Diastolic BP Percentile --      Pulse Rate 04/12/24 1552 64     Resp 04/12/24 1552 20     Temp 04/12/24 1552 98 F (36.7 C)     Temp src --      SpO2 04/12/24 1552 96 %     Weight --      Height --      Head Circumference --      Peak Flow --      Pain Score 04/12/24 1550 10     Pain Loc --      Pain Education --      Exclude from Growth Chart --    No data found.  Updated Vital Signs BP (!) 133/90   Pulse 64   Temp 98 F (36.7 C)   Resp 20   LMP 06/18/2021 (Approximate)   SpO2 96%   Visual Acuity Right Eye Distance:   Left Eye Distance:   Bilateral Distance:    Right Eye Near:   Left Eye Near:    Bilateral Near:     Physical Exam Vitals and nursing note reviewed.  Constitutional:      Appearance: Normal appearance.  HENT:     Head: Normocephalic and atraumatic.     Right Ear: External ear normal.     Left Ear: External ear normal.     Nose: Nose normal.     Mouth/Throat:     Mouth: Mucous membranes are moist.   Eyes:     Conjunctiva/sclera: Conjunctivae normal.  Cardiovascular:     Rate and Rhythm: Normal rate.  Pulmonary:  Effort: Pulmonary effort is normal. No respiratory distress.  Musculoskeletal:        General: Swelling and tenderness present. No signs of injury.     Left knee: Swelling and effusion present.     Comments: Diffuse left knee erythema, warmth and effusion.  Able to extend fully and bend to 90 degrees.  Atraumatic.  Skin:    General: Skin is warm and dry.  Neurological:     General: No focal deficit present.     Mental Status: She is alert.  Psychiatric:        Mood and Affect: Mood normal.        Behavior: Behavior is cooperative.      UC Treatments / Results  Labs (all labs ordered are listed, but only abnormal results are displayed) Labs Reviewed - No data to display  EKG   Radiology No results found.  Procedures Procedures (including critical care time)  Medications Ordered in UC Medications  dexamethasone  (DECADRON ) injection 10 mg (has no administration in time range)    Initial Impression / Assessment and Plan / UC Course  I have reviewed the triage vital signs and the nursing notes.  Pertinent labs & imaging results that were available during my care of the patient were reviewed by me and considered in my medical decision making (see chart for details).  Vitals in triage reviewed, patient is hemodynamically stable.  Recent synovial culture from aspiration did not show acute infection.  Swelling, pain and inflammation have since returned.  Afebrile without tachycardia, low concern for systemic infection at this time.  Suspicious for inflammatory process, IM steroid and oral steroid taper given.  Strict emergency precautions given if signs of bacterial infection develop.  Orthopedic follow-up as scheduled.  Plan of care, follow-up care return precautions given, no questions at this time.     Final Clinical Impressions(s) / UC Diagnoses    Final diagnoses:  Effusion of left knee     Discharge Instructions      Start the steroids tomorrow with breakfast.  We have given you a one-time steroid injection here in clinic to help with pain and swelling.  Use the knee sleeve to help provide compression. Rest, ice and elevate your knee.  It is important that you follow-up with your orthopedic on Tuesday as scheduled for further evaluation.  Seek immediate care at the nearest emergency department if you develop any worsening pain, fever, drainage from the knee, or new concerning symptoms.   ED Prescriptions     Medication Sig Dispense Auth. Provider   predniSONE  (DELTASONE ) 20 MG tablet Take 2 tablets (40 mg total) by mouth daily for 5 days. 10 tablet Harlow Lighter, Shyne Resch  N, FNP      PDMP not reviewed this encounter.   Opal Bill, FNP 04/12/24 [phone]

## 2024-04-12 NOTE — Discharge Instructions (Addendum)
 Start the steroids tomorrow with breakfast.  We have given you a one-time steroid injection here in clinic to help with pain and swelling.  Use the knee sleeve to help provide compression. Rest, ice and elevate your knee.  It is important that you follow-up with your orthopedic on Tuesday as scheduled for further evaluation.  Seek immediate care at the nearest emergency department if you develop any worsening pain, fever, drainage from the knee, or new concerning symptoms.

## 2024-04-14 LAB — GENECONNECT MOLECULAR SCREEN: Genetic Analysis Overall Interpretation: NEGATIVE

## 2024-04-16 ENCOUNTER — Telehealth: Payer: Self-pay

## 2024-04-16 ENCOUNTER — Encounter: Payer: Self-pay | Admitting: Family Medicine

## 2024-04-16 ENCOUNTER — Ambulatory Visit (INDEPENDENT_AMBULATORY_CARE_PROVIDER_SITE_OTHER): Admitting: Family Medicine

## 2024-04-16 ENCOUNTER — Ambulatory Visit (HOSPITAL_COMMUNITY)
Admission: RE | Admit: 2024-04-16 | Discharge: 2024-04-16 | Disposition: A | Discharge status: Home / Self Care | Source: Ambulatory Visit | Attending: Family Medicine | Admitting: Family Medicine

## 2024-04-16 VITALS — BP 130/84 | HR 64 | Ht 65.0 in

## 2024-04-16 DIAGNOSIS — M25561 Pain in right knee: Secondary | ICD-10-CM

## 2024-04-16 DIAGNOSIS — M170 Bilateral primary osteoarthritis of knee: Secondary | ICD-10-CM | POA: Diagnosis not present

## 2024-04-16 DIAGNOSIS — G8929 Other chronic pain: Secondary | ICD-10-CM

## 2024-04-16 DIAGNOSIS — M7989 Other specified soft tissue disorders: Secondary | ICD-10-CM

## 2024-04-16 DIAGNOSIS — M79662 Pain in left lower leg: Secondary | ICD-10-CM | POA: Diagnosis not present

## 2024-04-16 DIAGNOSIS — M25469 Effusion, unspecified knee: Secondary | ICD-10-CM

## 2024-04-16 DIAGNOSIS — M25462 Effusion, left knee: Secondary | ICD-10-CM

## 2024-04-16 DIAGNOSIS — M25562 Pain in left knee: Secondary | ICD-10-CM

## 2024-04-16 NOTE — Telephone Encounter (Signed)
 Ran for benefits case ID [id number]

## 2024-04-16 NOTE — Telephone Encounter (Signed)
 Please auth [name] , bilat knees

## 2024-04-16 NOTE — Patient Instructions (Addendum)
 Thank you for coming in today.   We will work to authorize [name] . You will hear from my office about scheduling, once we get insurance approval.   You have a Vascular Ultrasound scheduled for:  [facility] at [facility] [address], [address] [place], [place] [address]

## 2024-04-16 NOTE — Progress Notes (Signed)
   I, Miquel Amen, CMA acting as a scribe for Garlan Juniper, MD.  [name] is a 48 y.o. female who presents to [facility] today for L leg pain. Pt was last seen by Dr. Alease Hunter on 04/03/24 and her L knee was aspirated and injected. Pt was seen at [facility] on the 23rd for swelling and warmth in her L knee and was prescribed prednisone .  Today, pt reports some relief of sx after the knee was aspirated. No relief while on oral Prednisone . Notes worsening swelling in the knee this past Wednesday. Also notes increased warmth and erythema. Ambulating with a crutch today. Wearing knee. Also notes pain and swelling in the left shoulder and hand.   She has a follow-up appointment scheduled with her rheumatologist in about 2 and half weeks.  Dx testing:  04/03/24 Labs 02/14/24 L knee XR 09/04/23 Labs  Pertinent review of systems: No fevers or chills  Relevant historical information: Migraine headaches   Exam:  BP 130/84   Pulse 64   Ht 5\' 5"  (1.651 m)   LMP 06/18/2021 (Approximate)   SpO2 99%   BMI 32.12 kg/m  General: Well Developed, well nourished, and in no acute distress.   MSK: Bilateral knees large joint effusion.  Decreased range of motion.       Assessment and Plan: 48 y.o. female with multifactorial bilateral knee pain.  She does have DJD as a component in addition to some as yet diagnosed rheumatologic condition.  Plan for [name]  injections.  Work on authorization now and anticipate try to do those injections in the near future.  If not able to get these authorized we are running out of things that I can do for her knees.  Follow-up with rheumatology.  I believe the solution to the majority of her pain is going to be rheumatologic in nature.  Will proceed with vascular ultrasound to test for DVT left leg swelling.  PDMP not reviewed this encounter. No orders of the defined types were placed in this encounter.  No orders of the defined types  were placed in this encounter.    Discussed warning signs or symptoms. Please see discharge instructions. Patient expresses understanding.   The above documentation has been reviewed and is accurate and complete Garlan Juniper, M.D.

## 2024-04-17 ENCOUNTER — Ambulatory Visit: Payer: Self-pay | Admitting: Family Medicine

## 2024-04-17 NOTE — Progress Notes (Signed)
No DVT is seen.

## 2024-04-22 ENCOUNTER — Encounter: Payer: Self-pay | Admitting: Family Medicine

## 2024-04-30 DIAGNOSIS — M791 Myalgia, unspecified site: Secondary | ICD-10-CM | POA: Diagnosis not present

## 2024-04-30 DIAGNOSIS — M3505 Sjogren syndrome with inflammatory arthritis: Secondary | ICD-10-CM | POA: Diagnosis not present

## 2024-04-30 DIAGNOSIS — M1990 Unspecified osteoarthritis, unspecified site: Secondary | ICD-10-CM | POA: Diagnosis not present

## 2024-04-30 DIAGNOSIS — R5383 Other fatigue: Secondary | ICD-10-CM | POA: Diagnosis not present

## 2024-04-30 LAB — CBC AND DIFFERENTIAL
HCT: 39 (ref 36–46)
Hemoglobin: 12.6 (ref 12.0–16.0)
Platelets: 394 K/uL (ref 150–400)
WBC: 6.3

## 2024-04-30 LAB — BASIC METABOLIC PANEL WITH GFR
BUN: 6 (ref 4–21)
Chloride: 100 (ref 99–108)
Creatinine: 0.8 (ref 0.5–1.1)
Glucose: 81
Potassium: 4.7 meq/L (ref 3.5–5.1)
Sodium: 137 (ref 137–147)

## 2024-04-30 LAB — COMPREHENSIVE METABOLIC PANEL WITH GFR
Calcium: 9.5 (ref 8.7–10.7)
eGFR: 94

## 2024-04-30 LAB — HEPATIC FUNCTION PANEL
ALT: 10 U/L (ref 7–35)
AST: 13 (ref 13–35)

## 2024-04-30 LAB — CBC: RBC: 4.4 (ref 3.87–5.11)

## 2024-05-10 NOTE — Telephone Encounter (Signed)
 Mylinda Asa from [facility] called and needs clarification on some information for a prior auth sent for this patient for zilretta . She provided this call back number [phone] and said to choose the option for medical injectables and to check the status of prior authorization. Please advise.

## 2024-05-11 ENCOUNTER — Emergency Department (HOSPITAL_BASED_OUTPATIENT_CLINIC_OR_DEPARTMENT_OTHER): Admission: EM | Admit: 2024-05-11 | Discharge: 2024-05-11 | Disposition: A | Discharge status: Home / Self Care

## 2024-05-11 ENCOUNTER — Other Ambulatory Visit: Payer: Self-pay

## 2024-05-11 ENCOUNTER — Encounter (HOSPITAL_BASED_OUTPATIENT_CLINIC_OR_DEPARTMENT_OTHER): Payer: Self-pay | Admitting: Urology

## 2024-05-11 ENCOUNTER — Encounter: Payer: Self-pay | Admitting: Family Medicine

## 2024-05-11 DIAGNOSIS — M25562 Pain in left knee: Secondary | ICD-10-CM | POA: Insufficient documentation

## 2024-05-11 DIAGNOSIS — G8929 Other chronic pain: Secondary | ICD-10-CM | POA: Diagnosis not present

## 2024-05-11 DIAGNOSIS — M25561 Pain in right knee: Secondary | ICD-10-CM | POA: Diagnosis not present

## 2024-05-11 MED ORDER — OXYCODONE-ACETAMINOPHEN 5-325 MG PO TABS: 1.0000 | ORAL_TABLET | Freq: Four times a day (QID) | ORAL | 0 refills | Status: DC | PRN

## 2024-05-11 MED ORDER — DICLOFENAC SODIUM 1 % EX GEL: 2.0000 g | Freq: Four times a day (QID) | CUTANEOUS | 0 refills | Status: AC

## 2024-05-11 MED ORDER — PREDNISONE 50 MG PO TABS: ORAL_TABLET | ORAL | 0 refills | Status: DC

## 2024-05-11 MED ORDER — HYDROMORPHONE HCL 1 MG/ML IJ SOLN
0.5000 mg | Freq: Once | INTRAMUSCULAR | Status: AC
  Administered 2024-05-11: 0.5 mg via INTRAMUSCULAR
  Filled 2024-05-11: qty 1

## 2024-05-11 MED ORDER — KETOROLAC TROMETHAMINE 15 MG/ML IJ SOLN
15.0000 mg | Freq: Once | INTRAMUSCULAR | Status: AC
  Administered 2024-05-11: 15 mg via INTRAMUSCULAR
  Filled 2024-05-11: qty 1

## 2024-05-11 MED ORDER — PREDNISONE 50 MG PO TABS
60.0000 mg | ORAL_TABLET | Freq: Once | ORAL | Status: AC
  Administered 2024-05-11: 60 mg via ORAL
  Filled 2024-05-11: qty 1

## 2024-05-11 NOTE — ED Notes (Signed)
 Reviewed discharge instruction, medication and follow up with pt. Pt states understanding. Assisted to transport chair and in vehicle. Accompanied by friend

## 2024-05-11 NOTE — ED Triage Notes (Signed)
 Pt stays bilateral knee swelling and pain x 2 days  No new injury  Had them drained a couple months  Pain with ambulation   [facility] Dr. Janit is ortho provider

## 2024-05-11 NOTE — Discharge Instructions (Addendum)
 Please start the higher dose of steroids over the next few days.  You may also try the Voltaren  gel and see if this helps.  This is in the family naproxen but is not systemically absorbed as much so should not cause issues with bleeding.  Take the Percocet as needed for pain.  Do not drive or drink alcohol taking the Percocet.  Return to the emergency department for fevers or worsening symptoms.

## 2024-05-11 NOTE — ED Provider Notes (Signed)
 [place] EMERG[facility] DEPARTMENT AT [facility] Provider Note   CSN: [id number] Arrival date & time: 05/11/24  1439     Patient presents with: Knee Pain   [name] is a 48 y.o. female.   48 year old female with past medical history of recently diagnosed lupus who has recently been started on methotrexate presenting to the emergency department today with bilateral knee pain.  The patient states that she has been evaluated this as an outpatient and is scheduled to see her orthopedist next week.  She came in today due to worsening pain.  Denies any associated fevers.  Denies any recent injuries.  She states that she did have knee aspiration performed last month which was negative for any infection.  Also had a DVT study at that time that was negative.  She came to the emergency department today due to worsening pain.  She states that she was recently started on medication for lupus and this is the presumed cause for her arthralgias.  She states that she does have arthralgias elsewhere but currently her pain is in her bilateral knees.   Knee Pain      Prior to Admission medications   Medication Sig Start Date End Date Taking? Authorizing Provider  diclofenac  Sodium (VOLTAREN ) 1 % GEL Apply 2 g topically 4 (four) times daily. 05/11/24  Yes Ula Prentice SAUNDERS, MD  oxyCODONE -acetaminophen  (PERCOCET/ROXICET) 5-325 MG tablet Take 1 tablet by mouth every 6 (six) hours as needed for severe pain (pain score 7-10). 05/11/24  Yes Ula Prentice SAUNDERS, MD  predniSONE  (DELTASONE ) 50 MG tablet Take 1 tablet by mouth daily for the next 4 days 05/11/24  Yes Ula Prentice SAUNDERS, MD  albuterol  (VENTOLIN  HFA) 108 (90 Base) MCG/ACT inhaler Inhale 1-2 puffs into the lungs every 6 (six) hours as needed for wheezing or shortness of breath. 10/20/23 11/19/23  Kingston Robes, PA-C  AMBULATORY NON FORMULARY MEDICATION rolater with seat and brakes Dispense 1 Dx code: F74.438, P9768229, G89.29 Use as needed 04/03/24    Corey, Evan S, MD  amLODipine  (NORVASC ) 5 MG tablet Take 1 tablet (5 mg total) by mouth daily. 04/05/24   Rollene Almarie LABOR, MD  cetirizine  (ZYRTEC ) 5 MG tablet Take 1 tablet (5 mg total) by mouth daily. 05/31/23   Dohmeier, Dedra, MD  fluticasone  (FLONASE ) 50 MCG/ACT nasal spray Place 2 sprays into both nostrils daily. 10/20/23   Kingston Robes, PA-C  hydroxychloroquine  (PLAQUENIL ) 200 MG tablet Take 2 tablets (400 mg total) by mouth daily. 04/05/24   Rollene Almarie LABOR, MD  rizatriptan  (MAXALT -MLT) 10 MG disintegrating tablet Take 1 tablet (10 mg total) by mouth as needed for migraine. May repeat in 2 hours if needed 05/31/23   Dohmeier, Dedra, MD  venlafaxine  XR (EFFEXOR -XR) 150 MG 24 hr capsule TAKE 1 CAPSULE BY MOUTH DAILY WITH BREAKFAST. PLEASE CALL AND MAKE OVERDUE APPT FOR FURTHER REFILLS 06/01/23   Dohmeier, Dedra, MD  Vitamin D , Ergocalciferol , (DRISDOL ) 1.25 MG (50000 UNIT) CAPS capsule TAKE 1 CAPSULE (50,000 UNITS TOTAL) BY MOUTH EVERY 7 (SEVEN) DAYS 01/10/22   Plotnikov, Aleksei V, MD    Allergies: Fish allergy, Naproxen, Other, Sulfa antibiotics, Valium  [diazepam ], Asa [aspirin], and Topamax  [topiramate ]    Review of Systems  Musculoskeletal:  Positive for arthralgias.  All other systems reviewed and are negative.   Updated Vital Signs BP 115/83 (BP Location: Left Arm)   Pulse 79   Temp 98.4 F (36.9 C)   Resp 20   Ht 5' 5 (1.651  m)   Wt 87.5 kg   LMP 06/18/2021 (Approximate)   SpO2 99%   BMI 32.10 kg/m   Physical Exam Vitals and nursing note reviewed.  Constitutional:      Appearance: Normal appearance.   Cardiovascular:     Comments: 2+ DP and PT pulses bilaterally  Musculoskeletal:     Comments: There is mild swelling noted to the bilateral knees, no overlying erythema or warmth noted, no significant pain with passive range of motion noted.  Diminished range of motion due to pain actively.  Compartments are soft.   Neurological:     Mental Status: She is  alert.     Comments: Equal strength and sensation throughout the bilateral lower extremities    (all labs ordered are listed, but only abnormal results are displayed) Labs Reviewed - No data to display  EKG: None  Radiology: No results found.   Procedures   Medications Ordered in the ED  predniSONE  (DELTASONE ) tablet 60 mg (has no administration in time range)  HYDROmorphone  (DILAUDID ) injection 0.5 mg (has no administration in time range)  ketorolac  (TORADOL ) 15 MG/ML injection 15 mg (has no administration in time range)                                    Medical Decision Making 48 year old female presenting to the emergency department today with bilateral knee pain after having extensive workup as an outpatient that was found to likely be due to lupus.  The patient has had MRIs as well as arthrocentesis performed as an outpatient.  She denies any fevers and vital signs are stable here.  She is afebrile.  Suspicion for septic arthritis is low at this time.  She does have an appointment scheduled for follow-up with orthopedics.  I will give the patient IM Toradol  and Dilaudid  for pain.  Will give her a steroid burst as well until she is able to follow-up with orthopedics as an outpatient.  Have encouraged her to return to the ER for fevers or worsening symptoms.  She is discharged with return precautions.  Risk Prescription drug management.        Final diagnoses:  Chronic pain of both knees    ED Discharge Orders          Ordered    predniSONE  (DELTASONE ) 50 MG tablet        05/11/24 1534    oxyCODONE -acetaminophen  (PERCOCET/ROXICET) 5-325 MG tablet  Every 6 hours PRN        05/11/24 1534    diclofenac  Sodium (VOLTAREN ) 1 % GEL  4 times daily        05/11/24 1535               Ula Prentice SAUNDERS, MD 05/11/24 1536

## 2024-05-13 NOTE — Telephone Encounter (Signed)
Forwarding to Dr. Corey to review.  

## 2024-05-14 NOTE — Telephone Encounter (Signed)
 Called and answered clinical questions still pending auth/denial reference # [id number] under bu and bill for [employer] Healthy blue medicaid no Prior auth required for buy and bill

## 2024-05-15 ENCOUNTER — Ambulatory Visit: Admitting: Family Medicine

## 2024-05-15 NOTE — Telephone Encounter (Signed)
 Could you please f/u on authorization?

## 2024-05-15 NOTE — Progress Notes (Deleted)
   LILLETTE Ileana Collet, PhD, LAT, ATC acting as a scribe for Artist Lloyd, MD.  [name] is a 48 y.o. female who presents to [facility] at [facility] today for cont'd bilat knee pain. Pt was last seen by Dr. Lloyd on 04/16/24 and a vasc US  was ordered and she was advised to f/u w/ rheumatology  Of note, rheumatology recently dx her w/ lupus and started her on methotrexate.   Today, pt reports ***. She was seen at the ED on the 21st for this issue and was given an IM injection of Toradol  and Dilaudid .   Dx testing: 04/16/24 L Vasc US  04/03/24 Labs  04/01/24 R knee MRI 02/14/24 R & L knee XR 09/04/23 Labs  Pertinent review of systems: ***  Relevant historical information: ***   Exam:  LMP 06/18/2021 (Approximate)  General: Well Developed, well nourished, and in no acute distress.   MSK: ***    Lab and Radiology Results No results found for this or any previous visit (from the past 72 hours). No results found.     Assessment and Plan: 48 y.o. female with ***   PDMP not reviewed this encounter. No orders of the defined types were placed in this encounter.  No orders of the defined types were placed in this encounter.    Discussed warning signs or symptoms. Please see discharge instructions. Patient expresses understanding.   ***

## 2024-05-17 NOTE — Telephone Encounter (Signed)
 I am not sure what route you all want to take with this patient but this was her benefits and auth results below.   [name]  buy and bill no pa required for bilateral knee HEALTHY BLUE No pa, medical notes, or referrals needed  Patient covered at 100%  Patient has no liability other than up to a $4 copay This coverage represents 100% of the primary remaining coinsurance Primary deductible must be met before this coverage applies   PRIMARY ANTHEM DOES NOT COVER [name]  OR GEL Under buy and bill or pharmacy benefits  HEALTHY BLUE does not cover pharmacy benefits

## 2024-05-21 ENCOUNTER — Other Ambulatory Visit (HOSPITAL_COMMUNITY): Payer: Self-pay

## 2024-05-21 MED ORDER — TRIAMCINOLONE ACETONIDE 32 MG IX SRER
64.0000 mg | Freq: Once | INTRA_ARTICULAR | 0 refills | Status: AC
  Filled 2024-05-21: qty 2, 1d supply, fill #0
  Filled 2024-05-28: qty 2, 28d supply, fill #0

## 2024-05-21 NOTE — Addendum Note (Signed)
 Addended by: MARDY LEOTIS RAMAN on: 05/21/2024 04:23 PM   Modules accepted: Orders

## 2024-05-21 NOTE — Telephone Encounter (Signed)
 Checking with pharmacy about [name]  as [employer] BCBS denied but Healthy Blue does not require PA.

## 2024-05-21 NOTE — Telephone Encounter (Signed)
 Confirmed with [facility] that Rx is available for $4.  Called pt, VM full.   RX sent to [facility].

## 2024-05-22 ENCOUNTER — Other Ambulatory Visit: Payer: Self-pay

## 2024-05-22 ENCOUNTER — Other Ambulatory Visit (HOSPITAL_COMMUNITY): Payer: Self-pay

## 2024-05-22 ENCOUNTER — Encounter (HOSPITAL_COMMUNITY): Payer: Self-pay

## 2024-05-28 ENCOUNTER — Encounter: Payer: Self-pay | Admitting: Family Medicine

## 2024-05-28 ENCOUNTER — Other Ambulatory Visit (HOSPITAL_COMMUNITY): Payer: Self-pay

## 2024-05-28 ENCOUNTER — Other Ambulatory Visit: Payer: Self-pay

## 2024-05-28 ENCOUNTER — Encounter (HOSPITAL_COMMUNITY): Payer: Self-pay

## 2024-05-28 NOTE — Progress Notes (Signed)
 Pharmacy Patient Advocate Encounter  Insurance verification completed.   The patient is insured through [facility]   Ran test claim for [name] . Co-pay is $4  This test claim was processed through [facility]- copay amounts may vary at other pharmacies due to pharmacy/plan contracts, or as the patient moves through the different stages of their insurance plan.

## 2024-05-28 NOTE — Progress Notes (Signed)
 Specialty Pharmacy Initial Fill Coordination Note  [name] is a 48 y.o. female contacted today regarding initial fill of specialty medication(s) Triamcinolone  Acetonide ([name] )   Patient requested Courier to Provider Office   Delivery date: 05/30/24   Verified address: [facility] [address]   Medication will be filled on 7/9.   Patient is aware of $4 copayment.

## 2024-05-29 ENCOUNTER — Other Ambulatory Visit: Payer: Self-pay

## 2024-05-30 NOTE — Telephone Encounter (Signed)
 Pt called to schedule bilateral [name] , pt noted she is currently taking steroids and wanted to know if this has any impact on her scheduling of the [name] .

## 2024-05-30 NOTE — Telephone Encounter (Signed)
 [name]  arrived at office. Called pt, went straight to VM but no mail box. Sent pt MyChart msg to call for injection scheduling.

## 2024-05-30 NOTE — Telephone Encounter (Signed)
 Med to [place].

## 2024-05-30 NOTE — Telephone Encounter (Signed)
 Forwarding to Dr. Denyse Amass to review and advise.

## 2024-05-31 ENCOUNTER — Telehealth: Payer: Self-pay | Admitting: Family Medicine

## 2024-05-31 NOTE — Telephone Encounter (Signed)
 Forwarding to Dr. Joane to review and advise.   Per visit note 04/16/24:  Assessment and Plan: 48 y.o. female with multifactorial bilateral knee pain.  She does have DJD as a component in addition to some as yet diagnosed rheumatologic condition.  Plan for [name]  injections.  Work on authorization now and anticipate try to do those injections in the near future.  If not able to get these authorized we are running out of things that I can do for her knees.   Follow-up with rheumatology.  I believe the solution to the majority of her pain is going to be rheumatologic in nature.   Will proceed with vascular ultrasound to test for DVT left leg swelling.   PDMP not reviewed this encounter. No orders of the defined types were placed in this encounter.   No orders of the defined types were placed in this encounter.       Discussed warning signs or symptoms. Please see discharge instructions. Patient expresses understanding.     The above documentation has been reviewed and is accurate and complete Artist Joane, M.D.

## 2024-05-31 NOTE — Telephone Encounter (Signed)
 Patient is scheduled for [name]  on Thursday (soonest available).  She asked what she should do in the meantime for pain and had follow up questions in regards to the prednisone  that she will be finishing tomorrow.   Please advise

## 2024-06-03 NOTE — Telephone Encounter (Signed)
 That should be okay.

## 2024-06-03 NOTE — Telephone Encounter (Signed)
 Message sent to pt via My Chart

## 2024-06-03 NOTE — Telephone Encounter (Signed)
 Finish up the prednisone  on schedule and proceed to [name]  injection as scheduled

## 2024-06-06 ENCOUNTER — Other Ambulatory Visit: Payer: Self-pay

## 2024-06-06 ENCOUNTER — Ambulatory Visit: Admitting: Family Medicine

## 2024-06-06 VITALS — BP 132/84 | HR 82 | Ht 65.0 in

## 2024-06-06 DIAGNOSIS — G8929 Other chronic pain: Secondary | ICD-10-CM

## 2024-06-06 DIAGNOSIS — M25562 Pain in left knee: Secondary | ICD-10-CM

## 2024-06-06 DIAGNOSIS — M170 Bilateral primary osteoarthritis of knee: Secondary | ICD-10-CM

## 2024-06-06 DIAGNOSIS — M25561 Pain in right knee: Secondary | ICD-10-CM | POA: Diagnosis not present

## 2024-06-06 MED ORDER — TRIAMCINOLONE ACETONIDE 32 MG IX SRER
32.0000 mg | Freq: Once | INTRA_ARTICULAR | Status: AC
  Administered 2024-06-06: 32 mg via INTRA_ARTICULAR

## 2024-06-06 NOTE — Patient Instructions (Addendum)
 Thank you for coming in today.   You received an injection today. Seek immediate medical attention if the joint becomes red, extremely painful, or is oozing fluid.   Reminder: Dr. Joane will be out of the office starting August 1st, for about 6 weeks

## 2024-06-06 NOTE — Progress Notes (Signed)
 LILLETTE Ileana Collet, PhD, LAT, ATC acting as a scribe for Artist Lloyd, MD.  [name] is a 48 y.o. female who presents to [facility] at [facility] today for cont'd bilat knee pain. Pt was last seen by Dr. Lloyd on 04/16/24 and a vasc US  was ordered and she was advised to f/u w/ rheumatology  Of note, rheumatology recently dx her w/ lupus and started her on methotrexate.   Today, pt reports she is in a lot of pain today. Last night the R arm pain returned. She was seen at the ED on the 21st for this issue and was given an IM injection of Toradol  and Dilaudid . Today she is wanting to proceed w/ bilat [name]  injections today do to much pain in both knees.   She notes she is out of oxycodone  but has 2 prednisone  50mg  left.  She continues to take methotrexate and hydroxychloroquine .  She has a follow-up appointment with her rheumatologist Dr. Curt in a week or 2.  Dx testing: 04/16/24 L Vasc US  04/03/24 Labs  04/01/24 R knee MRI 02/14/24 R & L knee XR 09/04/23 Labs  Pertinent review of systems: No fevers or chills  Relevant historical information: Significant connective tissue disease hard to treat thought to be lupus   Exam:  BP 132/84   Pulse 82   Ht 5' 5 (1.651 m)   LMP 06/18/2021 (Approximate)   BMI 32.10 kg/m  General: Well Developed, well nourished, and in no acute distress.   MSK: Knees bilaterally large effusion decreased range of motion antalgic gait using a walker to ambulate.    Lab and Radiology Results   [name]  injection bilateral knee Procedure: Real-time Ultrasound Guided Injection of right knee joint superior lateral patellar space Device: Philips Affiniti 50G Images permanently stored and available for review in PACS Verbal informed consent obtained.  Discussed risks and benefits of procedure. Warned about infection, hyperglycemia bleeding, damage to structures among others. Patient expresses understanding and agreement Time-out  conducted.   Noted no overlying erythema, induration, or other signs of local infection.   Skin prepped in a sterile fashion.   Local anesthesia: Topical Ethyl chloride.   With sterile technique and under real time ultrasound guidance: [name]  32 mg injected into knee joint. Fluid seen entering the joint capsule.   Completed without difficulty   Advised to call if fevers/chills, erythema, induration, drainage, or persistent bleeding.   Images permanently stored and available for review in the ultrasound unit.  Impression: Technically successful ultrasound guided injection.   Procedure: Real-time Ultrasound Guided Injection of left knee joint superior lateral patellar space Device: Philips Affiniti 50G Images permanently stored and available for review in PACS Verbal informed consent obtained.  Discussed risks and benefits of procedure. Warned about infection, hyperglycemia bleeding, damage to structures among others. Patient expresses understanding and agreement Time-out conducted.   Noted no overlying erythema, induration, or other signs of local infection.   Skin prepped in a sterile fashion.   Local anesthesia: Topical Ethyl chloride.   With sterile technique and under real time ultrasound guidance: [name]  32 mg injected into knee joint. Fluid seen entering the joint capsule.   Completed without difficulty   Advised to call if fevers/chills, erythema, induration, drainage, or persistent bleeding.   Images permanently stored and available for review in the ultrasound unit.  Impression: Technically successful ultrasound guided injection.  Lot number: [id number] for both injections Patient provided her own [name]  through pharmacy benefit.  No medication charge  for today's injection.      Assessment and Plan: 48 y.o. female with bilateral knee pain due to osteoarthritis and rheumatologic condition thought to be lupus.  Plan for [name]  injection for the pain related to  osteoarthritis today.  Follow-up with rheumatology as scheduled and continue to advance rheumatologic treatment. We can repeat this injection in 3 months if needed.  PDMP not reviewed this encounter. Orders Placed This Encounter  Procedures   US  LIMITED JOINT SPACE STRUCTURES LOW BILAT(NO LINKED CHARGES)    Reason for Exam (SYMPTOM  OR DIAGNOSIS REQUIRED):   bilateral knee pain    Preferred imaging location?:   [facility] ordered this encounter  Medications   Triamcinolone  Acetonide ([name] ) intra-articular injection 32 mg   Triamcinolone  Acetonide ([name] ) intra-articular injection 32 mg     Discussed warning signs or symptoms. Please see discharge instructions. Patient expresses understanding.   The above documentation has been reviewed and is accurate and complete Artist Lloyd, M.D.

## 2024-06-11 DIAGNOSIS — M79643 Pain in unspecified hand: Secondary | ICD-10-CM | POA: Diagnosis not present

## 2024-06-11 DIAGNOSIS — Z79899 Other long term (current) drug therapy: Secondary | ICD-10-CM | POA: Diagnosis not present

## 2024-06-11 DIAGNOSIS — M1990 Unspecified osteoarthritis, unspecified site: Secondary | ICD-10-CM | POA: Diagnosis not present

## 2024-06-11 DIAGNOSIS — M0609 Rheumatoid arthritis without rheumatoid factor, multiple sites: Secondary | ICD-10-CM | POA: Diagnosis not present

## 2024-07-08 ENCOUNTER — Ambulatory Visit: Payer: Self-pay | Admitting: Internal Medicine

## 2024-07-08 ENCOUNTER — Ambulatory Visit (INDEPENDENT_AMBULATORY_CARE_PROVIDER_SITE_OTHER): Admitting: Emergency Medicine

## 2024-07-08 ENCOUNTER — Encounter: Payer: Self-pay | Admitting: Emergency Medicine

## 2024-07-08 VITALS — BP 122/84 | HR 66 | Temp 97.7°F | Ht 65.0 in | Wt 189.0 lb

## 2024-07-08 DIAGNOSIS — M5432 Sciatica, left side: Secondary | ICD-10-CM | POA: Insufficient documentation

## 2024-07-08 DIAGNOSIS — M545 Low back pain, unspecified: Secondary | ICD-10-CM | POA: Insufficient documentation

## 2024-07-08 MED ORDER — METHYLPREDNISOLONE 4 MG PO TBPK: ORAL_TABLET | ORAL | 1 refills | Status: DC

## 2024-07-08 MED ORDER — CYCLOBENZAPRINE HCL 10 MG PO TABS: 10.0000 mg | ORAL_TABLET | Freq: Every day | ORAL | 0 refills | Status: DC

## 2024-07-08 NOTE — Progress Notes (Signed)
 [name] 47 y.o.   Chief Complaint  Patient presents with   Back Pain    Patient here for lower back pain. Patient states it started 3 days ago mentions she has sciatic but has not gotten this bad. It is painful to sit.  She is not taking anything for the pain.      HISTORY OF PRESENT ILLNESS: Acute problem visit today. This is a 48 y.o. female complaining of left lower back pain radiating to buttock and posterior aspect of left leg that started about 3 days ago without any other associated symptoms No other complaints or medical concerns today.  Back Pain Pertinent negatives include no abdominal pain, chest pain, dysuria, fever or headaches.     Prior to Admission medications   Medication Sig Start Date End Date Taking? Authorizing Provider  albuterol  (VENTOLIN  HFA) 108 (90 Base) MCG/ACT inhaler Inhale 1-2 puffs into the lungs every 6 (six) hours as needed for wheezing or shortness of breath. 10/20/23 07/08/24 Yes Reyes, Roosvelt, PA-C  AMBULATORY NON FORMULARY MEDICATION rolater with seat and brakes Dispense 1 Dx code: F74.438, [id number], G89.29 Use as needed 04/03/24  Yes Joane Artist RAMAN, MD  amLODipine  (NORVASC ) 5 MG tablet Take 1 tablet (5 mg total) by mouth daily. 04/05/24  Yes Rollene Almarie LABOR, MD  cetirizine  (ZYRTEC ) 5 MG tablet Take 1 tablet (5 mg total) by mouth daily. Patient taking differently: Take 5 mg by mouth as needed. 05/31/23  Yes Dohmeier, Dedra, MD  cyclobenzaprine  (FLEXERIL ) 10 MG tablet Take 1 tablet (10 mg total) by mouth at bedtime. 07/08/24  Yes Seher Schlagel, Emil Schanz, MD  diclofenac  Sodium (VOLTAREN ) 1 % GEL Apply 2 g topically 4 (four) times daily. 05/11/24  Yes Ula Prentice SAUNDERS, MD  fluticasone  (FLONASE ) 50 MCG/ACT nasal spray Place 2 sprays into both nostrils daily. Patient taking differently: Place 2 sprays into both nostrils as needed. 10/20/23  Yes Reyes, Roosvelt, PA-C  hydroxychloroquine  (PLAQUENIL ) 200 MG tablet Take 2 tablets (400 mg total) by mouth  daily. 04/05/24  Yes Rollene Almarie LABOR, MD  methotrexate (RHEUMATREX) 2.5 MG tablet Take 10 mg by mouth once a week. 05/26/24  Yes [provider]  methylPREDNISolone  (MEDROL  DOSEPAK) 4 MG TBPK tablet Sig as indicated 07/08/24  Yes Kandra Graven, Emil Schanz, MD  rizatriptan  (MAXALT -MLT) 10 MG disintegrating tablet Take 1 tablet (10 mg total) by mouth as needed for migraine. May repeat in 2 hours if needed 05/31/23  Yes Dohmeier, Dedra, MD  venlafaxine  XR (EFFEXOR -XR) 150 MG 24 hr capsule TAKE 1 CAPSULE BY MOUTH DAILY WITH BREAKFAST. PLEASE CALL AND MAKE OVERDUE APPT FOR FURTHER REFILLS 06/01/23  Yes Dohmeier, Dedra, MD  Vitamin D , Ergocalciferol , (DRISDOL ) 1.25 MG (50000 UNIT) CAPS capsule TAKE 1 CAPSULE (50,000 UNITS TOTAL) BY MOUTH EVERY 7 (SEVEN) DAYS Patient not taking: Reported on 07/08/2024 01/10/22   Plotnikov, Karlynn GAILS, MD    Allergies  Allergen Reactions   Fish Allergy Anaphylaxis and Shortness Of Breath   Naproxen Anxiety, Other (See Comments) and Anaphylaxis    Vaginal bleeding and anxiety Other reaction(s): Bleeding (intolerance) Stomach bleeding Vaginal bleeding and anxiety   Other Anaphylaxis and Rash   Sulfa Antibiotics Anxiety, Other (See Comments) and Anaphylaxis    Vaginal bleeding and anxiety Vaginal bleeding and anxiety Vaginal bleeding and anxiety   Valium  [Diazepam ] Nausea And Vomiting   Asa [Aspirin]    Topamax  [Topiramate ]     Heart racing    Patient Active Problem List   Diagnosis Date Noted  Baker's cyst of knee, right 04/04/2024   Non-seasonal allergic rhinitis due to pollen 05/31/2023   Intermittent asthma without complication 05/31/2023   Episodic recurrent vertigo 04/06/2022   Tobacco dependence due to cigarettes 02/06/2022   Inadequate sleep hygiene 02/06/2022   Morning headache 02/06/2022   Foot pain, bilateral 12/16/2021   Snoring 12/16/2021   Convulsions (HCC) 03/12/2021   Migraine headache with aura 11/09/2020   Degenerative arthritis  of knee, bilateral 10/07/2019   Chronic pain of left ankle 04/13/2018   Vitamin D  deficiency 07/28/2016   Routine general medical examination at a health care facility 04/25/2016   Obesity 04/25/2016   Anemia 09/28/2014   Thrombocytopenia (HCC) 09/28/2014    Past Medical History:  Diagnosis Date   Arthritis    Osto   Constipation    GERD (gastroesophageal reflux disease)    Hypertension    Iron deficiency anemia    Migraine    Migraine headache with aura 11/09/2020   Sarcoidosis    Vertigo     Past Surgical History:  Procedure Laterality Date   CESAREAN SECTION     CESAREAN SECTION     KNEE SURGERY Right    ACL   steroid shots     TUBAL LIGATION      Social History   Socioeconomic History   Marital status: Single    Spouse name: Not on file   Number of children: 3   Years of education: 14   Highest education level: Not on file  Occupational History   Occupation: unemployed 11/09/20   Occupation: disabled  Tobacco Use   Smoking status: Every Day    Current packs/day: 1.00    Average packs/day: 1 pack/day for 17.0 years (17.0 ttl pk-yrs)    Types: Cigarettes   Smokeless tobacco: Never  Vaping Use   Vaping status: Never Used  Substance and Sexual Activity   Alcohol use: Not Currently   Drug use: No   Sexual activity: Not on file  Other Topics Concern   Not on file  Social History Narrative   Lives with son   Right Handed   Drinks 6-8 cups caffeine  daily.   Social Drivers of Corporate investment banker Strain: Not on file  Food Insecurity: Not on file  Transportation Needs: Not on file  Physical Activity: Not on file  Stress: Not on file  Social Connections: Unknown (04/04/2022)   Received from [facility]   Social Network    Social Network: Not on file  Intimate Partner Violence: Unknown (02/24/2022)   Received from [facility]   HITS    Physically Hurt: Not on file    Insult or Talk Down To: Not on file    Threaten Physical Harm: Not on  file    Scream or Curse: Not on file    Family History  Problem Relation Age of Onset   Lupus Mother    Healthy Father    Thyroid  disease Sister    Anemia Sister    Heart failure Maternal Grandmother    Diabetes Maternal Grandmother    Hypertension Maternal Grandmother    Thyroid  disease Maternal Grandmother    Diabetes Maternal Grandfather    Diabetes Paternal Grandmother    Colon polyps Neg Hx    Colon cancer Neg Hx    Esophageal cancer Neg Hx    Stomach cancer Neg Hx    Rectal cancer Neg Hx      Review of Systems  Constitutional: Negative.  Negative for  chills and fever.  HENT: Negative.  Negative for congestion and sore throat.   Respiratory: Negative.  Negative for cough and shortness of breath.   Cardiovascular: Negative.  Negative for chest pain and palpitations.  Gastrointestinal:  Negative for abdominal pain, nausea and vomiting.  Genitourinary: Negative.  Negative for dysuria and hematuria.  Musculoskeletal:  Positive for back pain.  Skin: Negative.  Negative for rash.  Neurological: Negative.  Negative for dizziness and headaches.  All other systems reviewed and are negative.   Vitals:   07/08/24 1026  BP: 122/84  Pulse: 66  Temp: 97.7 F (36.5 C)  SpO2: 96%    Physical Exam Vitals reviewed.  Constitutional:      Appearance: Normal appearance.  HENT:     Head: Normocephalic.  Eyes:     Extraocular Movements: Extraocular movements intact.  Cardiovascular:     Rate and Rhythm: Normal rate.  Pulmonary:     Effort: Pulmonary effort is normal.  Abdominal:     Palpations: Abdomen is soft.     Tenderness: There is no abdominal tenderness.  Skin:    General: Skin is warm and dry.  Neurological:     General: No focal deficit present.     Mental Status: She is alert and oriented to person, place, and time.     Sensory: No sensory deficit.     Motor: No weakness.     Coordination: Coordination normal.     Deep Tendon Reflexes: Reflexes normal.   Psychiatric:        Mood and Affect: Mood normal.        Behavior: Behavior normal.      ASSESSMENT & PLAN: I personally spent a total of 32 minutes minutes in the care of the patient today including preparing to see the patient, getting/reviewing separately obtained history, performing a medically appropriate exam/evaluation, counseling and educating, documenting clinical information in the EHR, coordinating care, and pain management, review of all medications, review of multiple chronic medical conditions under management, prognosis, and need for follow-up with orthopedist if no better or worse during the next couple weeks..  Problem List Items Addressed This Visit       Nervous and Auditory   Left sided sciatica - Primary   Clinically stable.  No red flag signs or symptoms. Allergic to NSAIDs Recommend Tylenol  for pain Start Medrol  Dosepak today Flexeril  10 mg at bedtime Follow-up with orthopedist if no better or worse during the next couple of weeks      Relevant Medications   methylPREDNISolone  (MEDROL  DOSEPAK) 4 MG TBPK tablet   cyclobenzaprine  (FLEXERIL ) 10 MG tablet     Other   Lumbar pain   Pain management discussed Recommend Tylenol  for pain Allergic to NSAIDs Recommend to start Medrol  Dosepak and Flexeril  10 mg at bedtime May need orthopedic follow-up      Relevant Medications   methylPREDNISolone  (MEDROL  DOSEPAK) 4 MG TBPK tablet   cyclobenzaprine  (FLEXERIL ) 10 MG tablet   Patient Instructions  Sciatica  Sciatica is pain, weakness, tingling, or loss of feeling (numbness) along the sciatic nerve. The sciatic nerve starts in the lower back and goes down the back of each leg. Sciatica usually affects one side of the body. Sciatica usually goes away on its own or with treatment. Sometimes, sciatica may come back. What are the causes? This condition happens when the sciatic nerve is pinched or has pressure put on it. This may be caused by: A disk in between the  bones of  the spine bulging out too far (herniated disk). Changes in the spinal disks due to aging. A condition that affects a muscle in the butt. Extra bone growth near the sciatic nerve. A break (fracture) of the area between your hip bones (pelvis). Pregnancy. Tumor. This is rare. What increases the risk? You are more likely to develop this condition if you: Play sports that put pressure or stress on the spine. Have poor strength and ease of movement (flexibility). Have had a back injury or back surgery. Sit for long periods of time. Do activities that involve bending or lifting over and over again. Are very overweight (obese). What are the signs or symptoms? Symptoms can vary from mild to very bad. They may include: Any of these problems in the lower back, leg, hip, or butt: Mild tingling, loss of feeling, or dull aches. A burning feeling. Sharp pains. Loss of feeling in the back of the calf or the sole of the foot. Leg weakness. Very bad back pain that makes it hard to move. These symptoms may get worse when you cough, sneeze, or laugh. They may also get worse when you sit or stand for long periods of time. How is this treated? This condition often gets better without any treatment. However, treatment may include: Changing or cutting back on physical activity when you have pain. Exercising, including strengthening and stretching. Putting ice or heat on the affected area. Shots of medicines to relieve pain and swelling or to relax your muscles. Surgery. Follow these instructions at home: Medicines Take over-the-counter and prescription medicines only as told by your doctor. Ask your doctor if you should avoid driving or using machines while you are taking your medicine. Managing pain     If told, put ice on the affected area. To do this: Put ice in a plastic bag. Place a towel between your skin and the bag. Leave the ice on for 20 minutes, 2-3 times a day. If your skin  turns bright red, take off the ice right away to prevent skin damage. The risk of skin damage is higher if you cannot feel pain, heat, or cold. If told, put heat on the affected area. Do this as often as told by your doctor. Use the heat source that your doctor tells you to use, such as a moist heat pack or a heating pad. Place a towel between your skin and the heat source. Leave the heat on for 20-30 minutes. If your skin turns bright red, take off the heat right away to prevent burns. The risk of burns is higher if you cannot feel pain, heat, or cold. Activity  Return to your normal activities when your doctor says that it is safe. Avoid activities that make your symptoms worse. Take short rests during the day. When you rest for a long time, do some physical activity or stretching between periods of rest. Avoid sitting for a long time without moving. Get up and move around at least one time each hour. Do exercises and stretches as told by your doctor. Do not lift anything that is heavier than 10 lb (4.5 kg). Avoid lifting heavy things even when you do not have symptoms. Avoid lifting heavy things over and over. When you lift objects, always lift in a way that is safe for your body. To do this, you should: Bend your knees. Keep the object close to your body. Avoid twisting. General instructions Stay at a healthy weight. Wear comfortable shoes that support your feet.  Avoid wearing high heels. Avoid sleeping on a mattress that is too soft or too hard. You might have less pain if you sleep on a mattress that is firm enough to support your back. Contact a doctor if: Your pain is not controlled by medicine. Your pain does not get better. Your pain gets worse. Your pain lasts longer than 4 weeks. You lose weight without trying. Get help right away if: You cannot control when you pee (urinate) or poop (have a bowel movement). You have weakness in any of these areas and it gets worse: Lower  back. The area between your hip bones. Butt. Legs. You have redness or swelling of your back. You have a burning feeling when you pee. Summary Sciatica is pain, weakness, tingling, or loss of feeling (numbness) along the sciatic nerve. This may include the lower back, legs, hips, and butt. This condition happens when the sciatic nerve is pinched or has pressure put on it. Treatment often includes rest, exercise, medicines, and putting ice or heat on the affected area. This information is not intended to replace advice given to you by your health care provider. Make sure you discuss any questions you have with your health care provider. Document Revised: 02/14/2022 Document Reviewed: 02/14/2022 Elsevier Patient Education  2024 Elsevier Inc.      Emil Schaumann, MD [facility]

## 2024-07-08 NOTE — Telephone Encounter (Signed)
 Patient was disconnected and called back to confirm appointment time today. Patient has no further needs from me at this time.

## 2024-07-08 NOTE — Telephone Encounter (Signed)
 FYI Only or Action Required?: FYI only for provider.  Patient was last seen in primary care on 04/05/2024 by Rollene Almarie LABOR, MD.  Called Nurse Triage reporting Back Pain.  Symptoms began several days ago.  Interventions attempted: Nothing.  Symptoms are: unchanged.  Triage Disposition: See HCP Within 4 Hours (Or PCP Triage)  Patient/caregiver understands and will follow disposition?: Yes             Copied from CRM #[id number]. Topic: Clinical - Red Word Triage >> Jul 08, 2024  9:15 AM Montie POUR wrote: Red Word that prompted transfer to Nurse Triage:  Her lower left side of back is hurting for the last several days. Hurts all the time but worst when laying down. Pain level is a 7-8. Reason for Disposition  [1] SEVERE back pain (e.g., excruciating, unable to do any normal activities) AND [2] not improved 2 hours after pain medicine  Answer Assessment - Initial Assessment Questions ONSET: When did the pain begin? (e.g., minutes, hours, days)     3 days  LOCATION: Where does it hurt? (upper, mid or lower back)     Lower left side of back   SEVERITY: How bad is the pain?  (e.g., Scale 1-10; mild, moderate, or severe)     8-10/10 pain level  PATTERN: Is the pain constant? (e.g., yes, no; constant, intermittent)      Constant  RADIATION: Does the pain shoot into your legs or somewhere else?     Left leg  CAUSE:  What do you think is causing the back pain?      Sciatica  BACK OVERUSE:  Any recent lifting of heavy objects, strenuous work or exercise?     No  MEDICINES: What have you taken so far for the pain? (e.g., nothing, acetaminophen , NSAIDS)     No  OTHER SYMPTOMS: Do you have any other symptoms? (e.g., fever, abdomen pain, burning with urination, blood in urine)       Denies  Protocols used: Back Pain-A-AH

## 2024-07-08 NOTE — Assessment & Plan Note (Signed)
 Pain management discussed Recommend Tylenol  for pain Allergic to NSAIDs Recommend to start Medrol  Dosepak and Flexeril  10 mg at bedtime May need orthopedic follow-up

## 2024-07-08 NOTE — Patient Instructions (Signed)
 Sciatica  Sciatica is pain, weakness, tingling, or loss of feeling (numbness) along the sciatic nerve. The sciatic nerve starts in the lower back and goes down the back of each leg. Sciatica usually affects one side of the body. Sciatica usually goes away on its own or with treatment. Sometimes, sciatica may come back. What are the causes? This condition happens when the sciatic nerve is pinched or has pressure put on it. This may be caused by: A disk in between the bones of the spine bulging out too far (herniated disk). Changes in the spinal disks due to aging. A condition that affects a muscle in the butt. Extra bone growth near the sciatic nerve. A break (fracture) of the area between your hip bones (pelvis). Pregnancy. Tumor. This is rare. What increases the risk? You are more likely to develop this condition if you: Play sports that put pressure or stress on the spine. Have poor strength and ease of movement (flexibility). Have had a back injury or back surgery. Sit for long periods of time. Do activities that involve bending or lifting over and over again. Are very overweight (obese). What are the signs or symptoms? Symptoms can vary from mild to very bad. They may include: Any of these problems in the lower back, leg, hip, or butt: Mild tingling, loss of feeling, or dull aches. A burning feeling. Sharp pains. Loss of feeling in the back of the calf or the sole of the foot. Leg weakness. Very bad back pain that makes it hard to move. These symptoms may get worse when you cough, sneeze, or laugh. They may also get worse when you sit or stand for long periods of time. How is this treated? This condition often gets better without any treatment. However, treatment may include: Changing or cutting back on physical activity when you have pain. Exercising, including strengthening and stretching. Putting ice or heat on the affected area. Shots of medicines to relieve pain and  swelling or to relax your muscles. Surgery. Follow these instructions at home: Medicines Take over-the-counter and prescription medicines only as told by your doctor. Ask your doctor if you should avoid driving or using machines while you are taking your medicine. Managing pain     If told, put ice on the affected area. To do this: Put ice in a plastic bag. Place a towel between your skin and the bag. Leave the ice on for 20 minutes, 2-3 times a day. If your skin turns bright red, take off the ice right away to prevent skin damage. The risk of skin damage is higher if you cannot feel pain, heat, or cold. If told, put heat on the affected area. Do this as often as told by your doctor. Use the heat source that your doctor tells you to use, such as a moist heat pack or a heating pad. Place a towel between your skin and the heat source. Leave the heat on for 20-30 minutes. If your skin turns bright red, take off the heat right away to prevent burns. The risk of burns is higher if you cannot feel pain, heat, or cold. Activity  Return to your normal activities when your doctor says that it is safe. Avoid activities that make your symptoms worse. Take short rests during the day. When you rest for a long time, do some physical activity or stretching between periods of rest. Avoid sitting for a long time without moving. Get up and move around at least one time each  hour. Do exercises and stretches as told by your doctor. Do not lift anything that is heavier than 10 lb (4.5 kg). Avoid lifting heavy things even when you do not have symptoms. Avoid lifting heavy things over and over. When you lift objects, always lift in a way that is safe for your body. To do this, you should: Bend your knees. Keep the object close to your body. Avoid twisting. General instructions Stay at a healthy weight. Wear comfortable shoes that support your feet. Avoid wearing high heels. Avoid sleeping on a mattress  that is too soft or too hard. You might have less pain if you sleep on a mattress that is firm enough to support your back. Contact a doctor if: Your pain is not controlled by medicine. Your pain does not get better. Your pain gets worse. Your pain lasts longer than 4 weeks. You lose weight without trying. Get help right away if: You cannot control when you pee (urinate) or poop (have a bowel movement). You have weakness in any of these areas and it gets worse: Lower back. The area between your hip bones. Butt. Legs. You have redness or swelling of your back. You have a burning feeling when you pee. Summary Sciatica is pain, weakness, tingling, or loss of feeling (numbness) along the sciatic nerve. This may include the lower back, legs, hips, and butt. This condition happens when the sciatic nerve is pinched or has pressure put on it. Treatment often includes rest, exercise, medicines, and putting ice or heat on the affected area. This information is not intended to replace advice given to you by your health care provider. Make sure you discuss any questions you have with your health care provider. Document Revised: 02/14/2022 Document Reviewed: 02/14/2022 Elsevier Patient Education  2024 [employer].

## 2024-07-08 NOTE — Assessment & Plan Note (Signed)
 Clinically stable.  No red flag signs or symptoms. Allergic to NSAIDs Recommend Tylenol  for pain Start Medrol  Dosepak today Flexeril  10 mg at bedtime Follow-up with orthopedist if no better or worse during the next couple of weeks

## 2024-07-11 ENCOUNTER — Encounter: Payer: Self-pay | Admitting: Internal Medicine

## 2024-07-18 ENCOUNTER — Other Ambulatory Visit: Payer: Self-pay

## 2024-07-25 ENCOUNTER — Other Ambulatory Visit: Payer: Self-pay

## 2024-08-04 ENCOUNTER — Other Ambulatory Visit: Payer: Self-pay | Admitting: Emergency Medicine

## 2024-08-04 DIAGNOSIS — M5432 Sciatica, left side: Secondary | ICD-10-CM

## 2024-08-04 DIAGNOSIS — M545 Low back pain, unspecified: Secondary | ICD-10-CM

## 2024-08-06 DIAGNOSIS — M7918 Myalgia, other site: Secondary | ICD-10-CM | POA: Diagnosis not present

## 2024-08-06 DIAGNOSIS — G8929 Other chronic pain: Secondary | ICD-10-CM | POA: Diagnosis not present

## 2024-08-06 DIAGNOSIS — M549 Dorsalgia, unspecified: Secondary | ICD-10-CM | POA: Diagnosis not present

## 2024-08-06 DIAGNOSIS — M533 Sacrococcygeal disorders, not elsewhere classified: Secondary | ICD-10-CM | POA: Diagnosis not present

## 2024-08-08 ENCOUNTER — Other Ambulatory Visit: Payer: Self-pay

## 2024-08-13 ENCOUNTER — Other Ambulatory Visit: Payer: Self-pay

## 2024-08-13 ENCOUNTER — Telehealth: Payer: Self-pay | Admitting: Family Medicine

## 2024-08-13 ENCOUNTER — Other Ambulatory Visit (HOSPITAL_COMMUNITY): Payer: Self-pay

## 2024-08-13 DIAGNOSIS — M170 Bilateral primary osteoarthritis of knee: Secondary | ICD-10-CM

## 2024-08-13 DIAGNOSIS — G8929 Other chronic pain: Secondary | ICD-10-CM

## 2024-08-13 MED ORDER — TRIAMCINOLONE ACETONIDE 32 MG IX SRER
32.0000 mg | Freq: Once | INTRA_ARTICULAR | 0 refills | Status: DC
  Filled 2024-08-13: qty 2, 2d supply, fill #0

## 2024-08-13 MED ORDER — TRIAMCINOLONE ACETONIDE 32 MG IX SRER: 32.0000 mg | Freq: Once | INTRA_ARTICULAR | 0 refills | Status: DC

## 2024-08-13 NOTE — Telephone Encounter (Signed)
 Rx refilled to [facility] for [name] , 10 mL, for bilat knees.

## 2024-08-13 NOTE — Telephone Encounter (Signed)
 Patient called stating that she would like to have repeat [name] .  This would have to be sent to the pharmacy  She said that the pharmacy has been calling her saying they are waiting for a prescription from us  so that it can be sent (she is not due until 10/9).  Appointment scheduled 10/10 but will change if medication has not arrived.  Can this be sent to [facility]?

## 2024-08-14 ENCOUNTER — Other Ambulatory Visit: Payer: Self-pay

## 2024-08-14 DIAGNOSIS — M170 Bilateral primary osteoarthritis of knee: Secondary | ICD-10-CM

## 2024-08-14 DIAGNOSIS — G8929 Other chronic pain: Secondary | ICD-10-CM

## 2024-08-14 MED ORDER — TRIAMCINOLONE ACETONIDE 32 MG IX SRER: 32.0000 mg | Freq: Once | INTRA_ARTICULAR | 0 refills | Status: AC

## 2024-08-15 ENCOUNTER — Other Ambulatory Visit: Payer: Self-pay

## 2024-08-16 ENCOUNTER — Other Ambulatory Visit: Payer: Self-pay

## 2024-08-16 NOTE — Progress Notes (Signed)
 Patient using [facility]. Dis-enrolling.

## 2024-08-19 ENCOUNTER — Ambulatory Visit: Admitting: Family Medicine

## 2024-08-26 ENCOUNTER — Other Ambulatory Visit: Payer: Self-pay

## 2024-08-26 ENCOUNTER — Encounter: Payer: Self-pay | Admitting: Family Medicine

## 2024-08-26 ENCOUNTER — Ambulatory Visit (INDEPENDENT_AMBULATORY_CARE_PROVIDER_SITE_OTHER): Admitting: Family Medicine

## 2024-08-26 ENCOUNTER — Other Ambulatory Visit (HOSPITAL_COMMUNITY): Payer: Self-pay

## 2024-08-26 VITALS — BP 136/86 | Ht 65.0 in | Wt 189.0 lb

## 2024-08-26 DIAGNOSIS — M25562 Pain in left knee: Secondary | ICD-10-CM

## 2024-08-26 DIAGNOSIS — M25512 Pain in left shoulder: Secondary | ICD-10-CM

## 2024-08-26 DIAGNOSIS — M25561 Pain in right knee: Secondary | ICD-10-CM

## 2024-08-26 DIAGNOSIS — M069 Rheumatoid arthritis, unspecified: Secondary | ICD-10-CM | POA: Insufficient documentation

## 2024-08-26 DIAGNOSIS — G8929 Other chronic pain: Secondary | ICD-10-CM | POA: Diagnosis not present

## 2024-08-26 DIAGNOSIS — M170 Bilateral primary osteoarthritis of knee: Secondary | ICD-10-CM

## 2024-08-26 MED ORDER — TRIAMCINOLONE ACETONIDE 32 MG IX SRER
32.0000 mg | Freq: Once | INTRA_ARTICULAR | 0 refills | Status: DC
  Filled 2024-08-26: qty 2, 28d supply, fill #0

## 2024-08-26 MED ORDER — KETOROLAC TROMETHAMINE 60 MG/2ML IM SOLN
60.0000 mg | Freq: Once | INTRAMUSCULAR | Status: AC
  Administered 2024-08-26: 60 mg via INTRAMUSCULAR

## 2024-08-26 NOTE — Telephone Encounter (Signed)
 [facility], Rx was transferred to [facility]. Called [facility], they do not have pt in their system.   Rx for [name]  (2 vials / 10 mL) sent to [facility].

## 2024-08-26 NOTE — Patient Instructions (Addendum)
 Thank you for coming in today.   You received an injection today. Seek immediate medical attention if the joint becomes red, extremely painful, or is oozing fluid.   We sent in a prescription for [name]  injections for both knees to [facility].   See you back later this week.

## 2024-08-26 NOTE — Telephone Encounter (Signed)
 Confirmed with pharmacy staff that rx has been received and is being processed. Should arrive at the office by 10/8.

## 2024-08-26 NOTE — Progress Notes (Signed)
   I, Leotis Batter, CMA acting as a scribe for Artist Lloyd, MD.  [name] is a 48 y.o. female who presents to [facility] at [facility] today for exacerbation of her bilat knee pain. Pt was last seen by Dr. Lloyd on 06/06/24 and both knees were injected w/ [name] .  Today, pt reports left-sided neck and left shoulder pain over the past week or so. Also notes exacerbation of bilat knee pain.   Dx testing: 04/16/24 L Vasc US  04/03/24 Labs             04/01/24 R knee MRI 02/14/24 R & L knee XR 09/04/23 Labs  Pertinent review of systems: No fevers or chills  Relevant historical information: Rheumatoid arthritis treatment with methotrexate and hydroxychloroquine  which have been somewhat helpful.   Exam:  BP 136/86   Ht 5' 5 (1.651 m)   Wt 189 lb (85.7 kg)   LMP 06/18/2021 (Approximate)   BMI 31.45 kg/m  General: Well Developed, well nourished, and in no acute distress.   MSK: Left shoulder normal appearing Reduced range of motion pain with abduction.  Intact strength.  Positive Hawkins and Neer's test. C-spine tender palpation left cervical paraspinal musculature. Upper Smir strength is intact.    Lab and Radiology Results  Procedure: Real-time Ultrasound Guided Injection of left shoulder subacromial bursa Device: Philips Affiniti 50G/GE Logiq Images permanently stored and available for review in PACS Verbal informed consent obtained.  Discussed risks and benefits of procedure. Warned about infection, bleeding, hyperglycemia damage to structures among others. Patient expresses understanding and agreement Time-out conducted.   Noted no overlying erythema, induration, or other signs of local infection.   Skin prepped in a sterile fashion.   Local anesthesia: Topical Ethyl chloride.   With sterile technique and under real time ultrasound guidance: 40 mg of Kenalog  and 2 ml of Marcaine injected into subacromial bursa. Fluid seen entering the bursa.    Completed without difficulty   Pain immediately resolved suggesting accurate placement of the medication.   Advised to call if fevers/chills, erythema, induration, drainage, or persistent bleeding.   Images permanently stored and available for review in the ultrasound unit.  Impression: Technically successful ultrasound guided injection.        Assessment and Plan: 48 y.o. female with left shoulder pain due to subacromial bursitis and impingement and periscapular dysfunction.  Plan for injection today. Additionally provided Toradol  injection IM today.  As for her knees she is being arranged for [name]  injection.  There was an issue getting the medicine sent to the pharmacy and then sent to the office.  I expect to have this clarified by the end of the week and be able to do [name]  injections soon.    PDMP not reviewed this encounter. Orders Placed This Encounter  Procedures   US  LIMITED JOINT SPACE STRUCTURES LOW BILAT(NO LINKED CHARGES)    Reason for Exam (SYMPTOM  OR DIAGNOSIS REQUIRED):   bilateral knee pain    Preferred imaging location?:   [facility] ordered this encounter  Medications   ketorolac  (TORADOL ) injection 60 mg     Discussed warning signs or symptoms. Please see discharge instructions. Patient expresses understanding.   The above documentation has been reviewed and is accurate and complete Artist Lloyd, M.D.

## 2024-08-26 NOTE — Progress Notes (Signed)
 Specialty Pharmacy Refill Coordination Note  [name] is a 49 y.o. female contacted today regarding refills of specialty medication(s) Triamcinolone  Acetonide ([name] )   Patient requested Courier to Provider Office   Delivery date: 08/28/24   Verified address: [facility] [address]   Medication will be filled on 08/27/24.

## 2024-08-26 NOTE — Addendum Note (Signed)
 Addended by: MARDY LEOTIS RAMAN on: 08/26/2024 08:16 AM   Modules accepted: Orders

## 2024-08-27 ENCOUNTER — Other Ambulatory Visit: Payer: Self-pay

## 2024-08-27 ENCOUNTER — Other Ambulatory Visit (HOSPITAL_COMMUNITY): Payer: Self-pay

## 2024-08-28 ENCOUNTER — Other Ambulatory Visit (HOSPITAL_COMMUNITY): Payer: Self-pay

## 2024-08-28 ENCOUNTER — Other Ambulatory Visit: Payer: Self-pay

## 2024-08-28 NOTE — Telephone Encounter (Signed)
 Called and spoke with patient, advised that medication has not arrived in office yet but that [name] will give her a call to schedule / work in once we have it in office. I will plan to touch base with the pharmacy again later today to confirm delivery.

## 2024-08-29 ENCOUNTER — Other Ambulatory Visit: Payer: Self-pay

## 2024-08-30 ENCOUNTER — Ambulatory Visit: Admitting: Family Medicine

## 2024-08-30 ENCOUNTER — Other Ambulatory Visit: Payer: Self-pay

## 2024-08-30 DIAGNOSIS — M170 Bilateral primary osteoarthritis of knee: Secondary | ICD-10-CM

## 2024-08-30 DIAGNOSIS — M25561 Pain in right knee: Secondary | ICD-10-CM | POA: Diagnosis not present

## 2024-08-30 DIAGNOSIS — G8929 Other chronic pain: Secondary | ICD-10-CM | POA: Diagnosis not present

## 2024-08-30 DIAGNOSIS — M25562 Pain in left knee: Secondary | ICD-10-CM

## 2024-08-30 MED ORDER — TRIAMCINOLONE ACETONIDE 32 MG IX SRER
32.0000 mg | Freq: Once | INTRA_ARTICULAR | Status: AC
  Administered 2024-08-30: 32 mg via INTRA_ARTICULAR

## 2024-08-30 MED ORDER — TRIAMCINOLONE ACETONIDE 32 MG IX SRER
32.0000 mg | Freq: Once | INTRA_ARTICULAR | Status: AC
Start: 2024-08-30 — End: 2024-08-30
  Administered 2024-08-30: 32 mg via INTRA_ARTICULAR

## 2024-08-30 NOTE — Patient Instructions (Signed)
 Thank you for coming in today.   You received an injection today. Seek immediate medical attention if the joint becomes red, extremely painful, or is oozing fluid.

## 2024-08-30 NOTE — Telephone Encounter (Signed)
 Pt scheduled for [name]  injection this morning.

## 2024-08-30 NOTE — Progress Notes (Signed)
  [name]  injection bilateral knee Procedure: Real-time Ultrasound Guided Injection of left knee joint superior lateral patellar space Device: Philips Affiniti 50G Images permanently stored and available for review in PACS Verbal informed consent obtained.  Discussed risks and benefits of procedure. Warned about infection, hyperglycemia bleeding, damage to structures among others. Patient expresses understanding and agreement Time-out conducted.   Noted no overlying erythema, induration, or other signs of local infection.   Skin prepped in a sterile fashion.   Local anesthesia: Topical Ethyl chloride.   With sterile technique and under real time ultrasound guidance: [name]  32 mg injected into knee joint. Fluid seen entering the joint capsule.   Completed without difficulty   Advised to call if fevers/chills, erythema, induration, drainage, or persistent bleeding.   Images permanently stored and available for review in the ultrasound unit.  Impression: Technically successful ultrasound guided injection.   Procedure: Real-time Ultrasound Guided Injection of right knee joint superior lateral patellar space Device: Philips Affiniti 50G Images permanently stored and available for review in PACS Verbal informed consent obtained.  Discussed risks and benefits of procedure. Warned about infection, hyperglycemia bleeding, damage to structures among others. Patient expresses understanding and agreement Time-out conducted.   Noted no overlying erythema, induration, or other signs of local infection.   Skin prepped in a sterile fashion.   Local anesthesia: Topical Ethyl chloride.   With sterile technique and under real time ultrasound guidance: [name]  32 mg injected into knee joint. Fluid seen entering the joint capsule.   Completed without difficulty   Advised to call if fevers/chills, erythema, induration, drainage, or persistent bleeding.   Images permanently stored and available for review  in the ultrasound unit.  Impression: Technically successful ultrasound guided injection.  Lot number: [id number]  Patient provided her own [name]  through the pharmacy benefit.  There should be no medication charge for today's injection visit.

## 2024-09-06 ENCOUNTER — Encounter: Admitting: Internal Medicine

## 2024-09-17 ENCOUNTER — Other Ambulatory Visit (HOSPITAL_COMMUNITY)
Admission: RE | Admit: 2024-09-17 | Discharge: 2024-09-17 | Disposition: A | Discharge status: Home / Self Care | Source: Ambulatory Visit | Attending: Family Medicine | Admitting: Family Medicine

## 2024-09-17 ENCOUNTER — Encounter: Payer: Self-pay | Admitting: Family Medicine

## 2024-09-17 ENCOUNTER — Ambulatory Visit (INDEPENDENT_AMBULATORY_CARE_PROVIDER_SITE_OTHER): Admitting: Family Medicine

## 2024-09-17 VITALS — BP 120/72 | HR 75 | Temp 97.7°F | Ht 65.0 in | Wt 189.0 lb

## 2024-09-17 DIAGNOSIS — N898 Other specified noninflammatory disorders of vagina: Secondary | ICD-10-CM

## 2024-09-17 DIAGNOSIS — Z01419 Encounter for gynecological examination (general) (routine) without abnormal findings: Secondary | ICD-10-CM

## 2024-09-17 MED ORDER — FLUCONAZOLE 150 MG PO TABS: 150.0000 mg | ORAL_TABLET | Freq: Once | ORAL | 0 refills | Status: AC

## 2024-09-17 NOTE — Progress Notes (Signed)
 Subjective:     Patient ID: [name], female    DOB: Jul 29, 1976, 48 y.o.   MRN: [id number]  Chief Complaint  Patient presents with   Vaginal Discharge   Vaginal Itching    Vaginal Discharge The patient's primary symptoms include vaginal discharge. Pertinent negatives include no abdominal pain, chills, constipation, diarrhea, dysuria, fever, frequency, headaches, nausea, urgency or vomiting.  Vaginal Itching The patient's primary symptoms include vaginal discharge. Pertinent negatives include no abdominal pain, chills, constipation, diarrhea, dysuria, fever, frequency, headaches, nausea, urgency or vomiting.    Discussed the use of AI scribe software for clinical note transcription with the patient, who gave verbal consent to proceed.  History of Present Illness [name] is a 48 year old female who presents with vaginal itching and discharge.  Vaginal pruritus and discharge - Vaginal itching and yellowish-green discharge present since September 13, 2024 - Sensation of swelling in the vaginal area - No recent sexual activity - No concerns related to sexual health  Urinary and systemic symptoms - No urinary frequency, urgency, or dysuria - No abdominal pain, nausea, vomiting, fever, or chills  Gynecologic history - Menopausal since age 46 with no possibility of pregnancy - History of yeast infection and bacterial vaginosis - History of STI approximately thirty years ago during first pregnancy     Health Maintenance Due  Topic Date Due   Hepatitis C Screening  Never done   DTaP/Tdap/Td (1 - Tdap) Never done   Pneumococcal Vaccine (1 of 2 - PCV) Never done   Hepatitis B Vaccines 19-59 Average Risk (1 of 3 - 19+ 3-dose series) Never done   Cervical Cancer Screening (HPV/Pap Cotest)  Never done   Mammogram  Never done   COVID-19 Vaccine (3 - Pfizer risk series) 04/13/2020    Past Medical History:  Diagnosis Date   Arthritis    Osto   Constipation    GERD  (gastroesophageal reflux disease)    Hypertension    Iron deficiency anemia    Migraine    Migraine headache with aura 11/09/2020   Sarcoidosis    Vertigo     Past Surgical History:  Procedure Laterality Date   CESAREAN SECTION     CESAREAN SECTION     KNEE SURGERY Right    ACL   steroid shots     TUBAL LIGATION      Family History  Problem Relation Age of Onset   Lupus Mother    Healthy Father    Thyroid  disease Sister    Anemia Sister    Heart failure Maternal Grandmother    Diabetes Maternal Grandmother    Hypertension Maternal Grandmother    Thyroid  disease Maternal Grandmother    Diabetes Maternal Grandfather    Diabetes Paternal Grandmother    Colon polyps Neg Hx    Colon cancer Neg Hx    Esophageal cancer Neg Hx    Stomach cancer Neg Hx    Rectal cancer Neg Hx     Social History   Socioeconomic History   Marital status: Single    Spouse name: Not on file   Number of children: 3   Years of education: 14   Highest education level: Not on file  Occupational History   Occupation: unemployed 11/09/20   Occupation: disabled  Tobacco Use   Smoking status: Every Day    Current packs/day: 1.00    Average packs/day: 1 pack/day for 17.0 years (17.0 ttl pk-yrs)    Types: Cigarettes  Smokeless tobacco: Never  Vaping Use   Vaping status: Never Used  Substance and Sexual Activity   Alcohol use: Not Currently   Drug use: No   Sexual activity: Not on file  Other Topics Concern   Not on file  Social History Narrative   Lives with son   Right Handed   Drinks 6-8 cups caffeine  daily.   Social Drivers of Corporate Investment Banker Strain: Not on file  Food Insecurity: Not on file  Transportation Needs: Not on file  Physical Activity: Not on file  Stress: Not on file  Social Connections: Unknown (04/04/2022)   Received from [facility]   Social Network    Social Network: Not on file  Intimate Partner Violence: Unknown (02/24/2022)   Received from  [facility]   HITS    Physically Hurt: Not on file    Insult or Talk Down To: Not on file    Threaten Physical Harm: Not on file    Scream or Curse: Not on file    Outpatient Medications Prior to Visit  Medication Sig Dispense Refill   albuterol  (VENTOLIN  HFA) 108 (90 Base) MCG/ACT inhaler Inhale 1-2 puffs into the lungs every 6 (six) hours as needed for wheezing or shortness of breath. 6.7 g 0   AMBULATORY NON FORMULARY MEDICATION rolater with seat and brakes Dispense 1 Dx code: M25.561, M25.562, G89.29 Use as needed 1 Device 0   amLODipine  (NORVASC ) 5 MG tablet Take 1 tablet (5 mg total) by mouth daily. 90 tablet 3   cetirizine  (ZYRTEC ) 5 MG tablet Take 1 tablet (5 mg total) by mouth daily. (Patient taking differently: Take 5 mg by mouth as needed.) 30 tablet 5   cyclobenzaprine  (FLEXERIL ) 10 MG tablet TAKE 1 TABLET BY MOUTH EVERY DAY AT BEDTIME 30 tablet 0   diclofenac  Sodium (VOLTAREN ) 1 % GEL Apply 2 g topically 4 (four) times daily. 100 g 0   fluticasone  (FLONASE ) 50 MCG/ACT nasal spray Place 2 sprays into both nostrils daily. (Patient taking differently: Place 2 sprays into both nostrils as needed.) 16 g 0   hydroxychloroquine  (PLAQUENIL ) 200 MG tablet Take 2 tablets (400 mg total) by mouth daily. 60 tablet 0   methotrexate (RHEUMATREX) 2.5 MG tablet Take 10 mg by mouth once a week.     methylPREDNISolone  (MEDROL  DOSEPAK) 4 MG TBPK tablet Sig as indicated 21 tablet 1   rizatriptan  (MAXALT -MLT) 10 MG disintegrating tablet Take 1 tablet (10 mg total) by mouth as needed for migraine. May repeat in 2 hours if needed 10 tablet 11   venlafaxine  XR (EFFEXOR -XR) 150 MG 24 hr capsule TAKE 1 CAPSULE BY MOUTH DAILY WITH BREAKFAST. PLEASE CALL AND MAKE OVERDUE APPT FOR FURTHER REFILLS 90 capsule 2   Vitamin D , Ergocalciferol , (DRISDOL ) 1.25 MG (50000 UNIT) CAPS capsule TAKE 1 CAPSULE (50,000 UNITS TOTAL) BY MOUTH EVERY 7 (SEVEN) DAYS (Patient not taking: Reported on 09/17/2024) 4 capsule 0    No facility-administered medications prior to visit.    Allergies  Allergen Reactions   Fish Allergy Anaphylaxis and Shortness Of Breath   Naproxen Anxiety, Other (See Comments) and Anaphylaxis    Vaginal bleeding and anxiety Other reaction(s): Bleeding (intolerance) Stomach bleeding Vaginal bleeding and anxiety   Other Anaphylaxis and Rash   Sulfa Antibiotics Anxiety, Other (See Comments) and Anaphylaxis    Vaginal bleeding and anxiety Vaginal bleeding and anxiety Vaginal bleeding and anxiety   Valium  [Diazepam ] Nausea And Vomiting   Asa [Aspirin]  Topamax  [Topiramate ]     Heart racing    Review of Systems  Constitutional:  Negative for chills and fever.  Respiratory:  Negative for shortness of breath.   Cardiovascular:  Negative for chest pain and palpitations.  Gastrointestinal:  Negative for abdominal pain, constipation, diarrhea, nausea and vomiting.  Genitourinary:  Positive for vaginal discharge. Negative for dysuria, frequency and urgency.  Skin:  Positive for itching.  Neurological:  Negative for dizziness and headaches.       Objective:    Physical Exam Constitutional:      General: She is not in acute distress.    Appearance: She is not ill-appearing.  Eyes:     Extraocular Movements: Extraocular movements intact.     Conjunctiva/sclera: Conjunctivae normal.  Cardiovascular:     Rate and Rhythm: Normal rate.  Pulmonary:     Effort: Pulmonary effort is normal.  Genitourinary:    Comments: Self swab done  Musculoskeletal:     Cervical back: Normal range of motion and neck supple.  Skin:    General: Skin is warm and dry.  Neurological:     General: No focal deficit present.     Mental Status: She is alert and oriented to person, place, and time.  Psychiatric:        Mood and Affect: Mood normal.        Behavior: Behavior normal.        Thought Content: Thought content normal.      BP 120/72   Pulse 75   Temp 97.7 F (36.5 C) (Temporal)    Ht 5' 5 (1.651 m)   Wt 189 lb (85.7 kg)   LMP 06/18/2021 (Approximate)   SpO2 95%   BMI 31.45 kg/m  Wt Readings from Last 3 Encounters:  09/17/24 189 lb (85.7 kg)  08/26/24 189 lb (85.7 kg)  07/08/24 189 lb (85.7 kg)       Assessment & Plan:   Problem List Items Addressed This Visit   None Visit Diagnoses       Vaginal itching    -  Primary   Relevant Orders   Cervicovaginal ancillary only   Ambulatory referral to Gynecology     Vaginal discharge       Relevant Orders   Cervicovaginal ancillary only   Ambulatory referral to Gynecology     Encounter for breast and pelvic examination       Relevant Orders   Ambulatory referral to Gynecology       Assessment and Plan Assessment & Plan Acute vaginitis with suspected vaginal yeast infection Acute onset of vaginal itching and yellowish-green discharge since Friday.  Awaiting swab results for confirmation. - Prescribed Diflucan  (fluconazole ) for suspected yeast infection. - Self vaginal swab done, Nuswab for GC/CT, trich, yeast and BV - referral placed to [facility] per patient request.     I am having Temple D. Druck start on fluconazole . I am also having her maintain her Vitamin D  (Ergocalciferol ), rizatriptan , cetirizine , venlafaxine  XR, fluticasone , albuterol , AMBULATORY NON FORMULARY MEDICATION, hydroxychloroquine , amLODipine , diclofenac  Sodium, methotrexate, methylPREDNISolone , and cyclobenzaprine .  Meds ordered this encounter  Medications   fluconazole  (DIFLUCAN ) 150 MG tablet    Sig: Take 1 tablet (150 mg total) by mouth once for 1 dose.    Dispense:  1 tablet    Refill:  0    Supervising Provider:   ROLLENE NORRIS A [[id number]]

## 2024-09-17 NOTE — Patient Instructions (Signed)
 Take the Diflucan  as prescribed.  I will be in touch with your results once I have those.

## 2024-09-18 ENCOUNTER — Other Ambulatory Visit: Payer: Self-pay

## 2024-09-18 ENCOUNTER — Ambulatory Visit: Payer: Self-pay | Admitting: Family Medicine

## 2024-09-18 ENCOUNTER — Other Ambulatory Visit: Payer: Self-pay | Admitting: Family Medicine

## 2024-09-18 LAB — CERVICOVAGINAL ANCILLARY ONLY
Bacterial Vaginitis (gardnerella): POSITIVE — AB
Candida Glabrata: NEGATIVE
Candida Vaginitis: POSITIVE — AB
Chlamydia: NEGATIVE
Comment: NEGATIVE
Comment: NEGATIVE
Comment: NEGATIVE
Comment: NEGATIVE
Comment: NEGATIVE
Comment: NORMAL
Neisseria Gonorrhea: NEGATIVE
Trichomonas: NEGATIVE

## 2024-09-18 MED ORDER — METRONIDAZOLE 500 MG PO TABS: 500.0000 mg | ORAL_TABLET | Freq: Two times a day (BID) | ORAL | 0 refills | Status: DC

## 2024-09-18 NOTE — Progress Notes (Signed)
 Her vaginal swab was positive for yeast and we sent in medication yesterday to treat that.  She also has bacterial vaginosis.  I will send in the antibiotic to treat this.  It is called metronidazole and she should avoid alcohol while she is taking it or it could make her sick.  She is negative for sexually-transmitted infections.

## 2024-09-23 ENCOUNTER — Encounter: Admitting: Internal Medicine

## 2024-10-01 ENCOUNTER — Encounter: Admitting: Internal Medicine

## 2024-10-31 DIAGNOSIS — H524 Presbyopia: Secondary | ICD-10-CM | POA: Diagnosis not present

## 2024-10-31 DIAGNOSIS — H52203 Unspecified astigmatism, bilateral: Secondary | ICD-10-CM | POA: Diagnosis not present

## 2024-10-31 DIAGNOSIS — Z8669 Personal history of other diseases of the nervous system and sense organs: Secondary | ICD-10-CM | POA: Diagnosis not present

## 2024-10-31 DIAGNOSIS — Z79899 Other long term (current) drug therapy: Secondary | ICD-10-CM | POA: Diagnosis not present

## 2024-10-31 DIAGNOSIS — H5213 Myopia, bilateral: Secondary | ICD-10-CM | POA: Diagnosis not present

## 2024-10-31 DIAGNOSIS — M1380 Other specified arthritis, unspecified site: Secondary | ICD-10-CM | POA: Diagnosis not present

## 2024-10-31 DIAGNOSIS — H1013 Acute atopic conjunctivitis, bilateral: Secondary | ICD-10-CM | POA: Diagnosis not present

## 2024-11-13 ENCOUNTER — Other Ambulatory Visit: Payer: Self-pay

## 2024-11-18 ENCOUNTER — Other Ambulatory Visit: Payer: Self-pay

## 2024-11-18 ENCOUNTER — Emergency Department (HOSPITAL_COMMUNITY)
Admission: EM | Admit: 2024-11-18 | Discharge: 2024-11-19 | Attending: Emergency Medicine | Admitting: Emergency Medicine

## 2024-11-18 ENCOUNTER — Ambulatory Visit: Payer: Self-pay

## 2024-11-18 ENCOUNTER — Encounter (HOSPITAL_COMMUNITY): Payer: Self-pay

## 2024-11-18 DIAGNOSIS — M1990 Unspecified osteoarthritis, unspecified site: Secondary | ICD-10-CM | POA: Insufficient documentation

## 2024-11-18 DIAGNOSIS — Z5321 Procedure and treatment not carried out due to patient leaving prior to being seen by health care provider: Secondary | ICD-10-CM | POA: Diagnosis not present

## 2024-11-18 DIAGNOSIS — R531 Weakness: Secondary | ICD-10-CM | POA: Diagnosis not present

## 2024-11-18 DIAGNOSIS — R109 Unspecified abdominal pain: Secondary | ICD-10-CM | POA: Diagnosis not present

## 2024-11-18 LAB — COMPREHENSIVE METABOLIC PANEL WITH GFR
ALT: 8 U/L (ref 0–44)
AST: 14 U/L — ABNORMAL LOW (ref 15–41)
Albumin: 4 g/dL (ref 3.5–5.0)
Alkaline Phosphatase: 105 U/L (ref 38–126)
Anion gap: 11 (ref 5–15)
BUN: 7 mg/dL (ref 6–20)
CO2: 24 mmol/L (ref 22–32)
Calcium: 9.7 mg/dL (ref 8.9–10.3)
Chloride: 104 mmol/L (ref 98–111)
Creatinine, Ser: 0.67 mg/dL (ref 0.44–1.00)
GFR, Estimated: >60 mL/min
Glucose, Bld: 91 mg/dL (ref 70–99)
Potassium: 3.7 mmol/L (ref 3.5–5.1)
Sodium: 140 mmol/L (ref 135–145)
Total Bilirubin: 0.4 mg/dL (ref 0.0–1.2)
Total Protein: 8.3 g/dL — ABNORMAL HIGH (ref 6.5–8.1)

## 2024-11-18 LAB — CBC
HCT: 40.9 % (ref 36.0–46.0)
Hemoglobin: 13 g/dL (ref 12.0–15.0)
MCH: 29 pg (ref 26.0–34.0)
MCHC: 31.8 g/dL (ref 30.0–36.0)
MCV: 91.1 fL (ref 80.0–100.0)
Platelets: 348 K/uL (ref 150–400)
RBC: 4.49 MIL/uL (ref 3.87–5.11)
RDW: 12.4 % (ref 11.5–15.5)
WBC: 8.2 K/uL (ref 4.0–10.5)
nRBC: 0 % (ref 0.0–0.2)

## 2024-11-18 LAB — URINALYSIS, ROUTINE W REFLEX MICROSCOPIC
Bilirubin Urine: NEGATIVE
Glucose, UA: NEGATIVE mg/dL
Hgb urine dipstick: NEGATIVE
Ketones, ur: NEGATIVE mg/dL
Leukocytes,Ua: NEGATIVE
Nitrite: NEGATIVE
Protein, ur: 30 mg/dL — AB
Specific Gravity, Urine: 1.021 (ref 1.005–1.030)
pH: 5 (ref 5.0–8.0)

## 2024-11-18 LAB — LIPASE, BLOOD: Lipase: 38 U/L (ref 11–51)

## 2024-11-18 NOTE — ED Triage Notes (Addendum)
 Pt BIB EMS from home for chronic arthritis and has not taken her medications for a month because she ran out. Pt stated she's also having abdominal pain that's tightening up.

## 2024-11-18 NOTE — Telephone Encounter (Signed)
" °  FYI Only or Action Required?: FYI only for provider: ED advised.  Patient was last seen in primary care on 09/17/2024 by Lendia Boby CROME, NP-C.  Called Nurse Triage reporting Arm Pain.  Symptoms began a week ago.  Interventions attempted: Nothing.  Symptoms are: gradually worsening.  Triage Disposition:   Patient/caregiver understands and will follow disposition?:    Copied from CRM #[id number]. Topic: Clinical - Red Word Triage >> Nov 18, 2024  1:26 PM [name] wrote: [facility] that prompted transfer to Nurse Triage: pain  Pt called to speak to pain management she adv her arthritis is bothering her , she says pain and hurt in kneck and can't lift arm, brokeout in sweats . Reason for Disposition  [1] Age > 40 AND [2] no obvious cause AND [3] pain even when not moving the arm  (Exception: Pain is clearly made worse by moving arm or bending neck.)  Patient already left for the hospital/clinic.  Answer Assessment - Initial Assessment Questions Pt in ED waiting room for rt shoulder, neck, chest pain. Has been waiting a few hours. Advised to continue to wait. Before disconnecting they were calling her to the back.  Protocols used: Shoulder Pain-A-AH, No Contact or Duplicate Contact Call-A-AH  "

## 2024-11-19 NOTE — ED Notes (Signed)
Patient was seen walking out

## 2024-11-21 ENCOUNTER — Emergency Department (HOSPITAL_COMMUNITY)
Admission: EM | Admit: 2024-11-21 | Discharge: 2024-11-21 | Disposition: A | Discharge status: Home / Self Care | Attending: Emergency Medicine | Admitting: Emergency Medicine

## 2024-11-21 DIAGNOSIS — Z8739 Personal history of other diseases of the musculoskeletal system and connective tissue: Secondary | ICD-10-CM | POA: Diagnosis not present

## 2024-11-21 DIAGNOSIS — M2550 Pain in unspecified joint: Secondary | ICD-10-CM | POA: Insufficient documentation

## 2024-11-21 DIAGNOSIS — M791 Myalgia, unspecified site: Secondary | ICD-10-CM | POA: Diagnosis present

## 2024-11-21 DIAGNOSIS — M5432 Sciatica, left side: Secondary | ICD-10-CM

## 2024-11-21 DIAGNOSIS — M545 Low back pain, unspecified: Secondary | ICD-10-CM

## 2024-11-21 DIAGNOSIS — J45909 Unspecified asthma, uncomplicated: Secondary | ICD-10-CM | POA: Insufficient documentation

## 2024-11-21 LAB — MAGNESIUM: Magnesium: 1.9 mg/dL (ref 1.7–2.4)

## 2024-11-21 LAB — COMPREHENSIVE METABOLIC PANEL WITH GFR
ALT: 8 U/L (ref 0–44)
AST: 16 U/L (ref 15–41)
Albumin: 3.7 g/dL (ref 3.5–5.0)
Alkaline Phosphatase: 96 U/L (ref 38–126)
Anion gap: 11 (ref 5–15)
BUN: 6 mg/dL (ref 6–20)
CO2: 24 mmol/L (ref 22–32)
Calcium: 9.5 mg/dL (ref 8.9–10.3)
Chloride: 102 mmol/L (ref 98–111)
Creatinine, Ser: 0.63 mg/dL (ref 0.44–1.00)
GFR, Estimated: >60 mL/min
Glucose, Bld: 106 mg/dL — ABNORMAL HIGH (ref 70–99)
Potassium: 3.8 mmol/L (ref 3.5–5.1)
Sodium: 137 mmol/L (ref 135–145)
Total Bilirubin: 0.5 mg/dL (ref 0.0–1.2)
Total Protein: 7.8 g/dL (ref 6.5–8.1)

## 2024-11-21 LAB — CBC
HCT: 35.8 % — ABNORMAL LOW (ref 36.0–46.0)
Hemoglobin: 11.3 g/dL — ABNORMAL LOW (ref 12.0–15.0)
MCH: 28.5 pg (ref 26.0–34.0)
MCHC: 31.6 g/dL (ref 30.0–36.0)
MCV: 90.4 fL (ref 80.0–100.0)
Platelets: 337 K/uL (ref 150–400)
RBC: 3.96 MIL/uL (ref 3.87–5.11)
RDW: 12.1 % (ref 11.5–15.5)
WBC: 7.2 K/uL (ref 4.0–10.5)
nRBC: 0 % (ref 0.0–0.2)

## 2024-11-21 LAB — URINALYSIS, ROUTINE W REFLEX MICROSCOPIC
Bacteria, UA: NONE SEEN
Bilirubin Urine: NEGATIVE
Glucose, UA: NEGATIVE mg/dL
Hgb urine dipstick: NEGATIVE
Ketones, ur: NEGATIVE mg/dL
Leukocytes,Ua: NEGATIVE
Nitrite: NEGATIVE
Protein, ur: NEGATIVE mg/dL
Specific Gravity, Urine: 1.005 (ref 1.005–1.030)
pH: 6 (ref 5.0–8.0)

## 2024-11-21 LAB — CK: Total CK: 35 U/L — ABNORMAL LOW (ref 38–234)

## 2024-11-21 MED ORDER — METHYLPREDNISOLONE 4 MG PO TBPK: ORAL_TABLET | ORAL | 1 refills | Status: DC

## 2024-11-21 MED ORDER — AMLODIPINE BESYLATE 5 MG PO TABS: 5.0000 mg | ORAL_TABLET | Freq: Every day | ORAL | 0 refills | Status: AC

## 2024-11-21 MED ORDER — HYDROXYCHLOROQUINE SULFATE 200 MG PO TABS: 400.0000 mg | ORAL_TABLET | Freq: Every day | ORAL | 0 refills | Status: AC

## 2024-11-21 MED ORDER — SODIUM CHLORIDE 0.9 % IV BOLUS
1000.0000 mL | Freq: Once | INTRAVENOUS | Status: AC
  Administered 2024-11-21: 1000 mL via INTRAVENOUS

## 2024-11-21 MED ORDER — METHOTREXATE SODIUM 2.5 MG PO TABS: 10.0000 mg | ORAL_TABLET | ORAL | 0 refills | Status: AC

## 2024-11-21 MED ORDER — OXYCODONE HCL 5 MG PO TABS
5.0000 mg | ORAL_TABLET | Freq: Once | ORAL | Status: AC
  Administered 2024-11-21: 5 mg via ORAL
  Filled 2024-11-21: qty 1

## 2024-11-21 MED ORDER — OXYCODONE HCL 5 MG PO CAPS: 5.0000 mg | ORAL_CAPSULE | Freq: Four times a day (QID) | ORAL | 0 refills | Status: AC | PRN

## 2024-11-21 MED ORDER — DEXAMETHASONE SOD PHOSPHATE PF 10 MG/ML IJ SOLN
10.0000 mg | Freq: Once | INTRAMUSCULAR | Status: AC
  Administered 2024-11-21: 10 mg via INTRAVENOUS

## 2024-11-21 NOTE — Discharge Instructions (Signed)
 Please follow-up with your primary care doctor for further management and refill of medicine.  In the meantime I have sent short-term refill of your medicine.  I recommend taking Tylenol  and ibuprofen  for pain control.  You can take oxycodone  for breakthrough pain.  Return to the ED with new or worsening symptoms.

## 2024-11-21 NOTE — ED Provider Notes (Signed)
 " [facility]ERG[facility] DEPARTMENT AT [facility] Provider Note   CSN: [id number] Arrival date & time: 11/21/24  9287     Patient presents with: body pain   [name] is a 49 y.o. female patient with past medical history of RA on methotrexate and hydrochloric when, Baker's cyst of right knee, asthma, allergies presents to emergency room with complaint of bilateral wrist pain, bilateral ankle pain and right flank pain.  Patient reports that this has been ongoing for about 3 days.  She denies any runny nose cough congestion abdominal pain nausea vomiting or diarrhea.  No urinary symptoms she does report that she has RA and has been out of her medicines and is needing a refill. Still taking 10mg  prednisone  daily.    HPI     Prior to Admission medications  Medication Sig Start Date End Date Taking? Authorizing Provider  oxycodone  (OXY-IR) 5 MG capsule Take 1 capsule (5 mg total) by mouth every 6 (six) hours as needed (severe pain, breakthrough pain). 11/21/24  Yes Alexsis Kathman N, PA-C  albuterol  (VENTOLIN  HFA) 108 (90 Base) MCG/ACT inhaler Inhale 1-2 puffs into the lungs every 6 (six) hours as needed for wheezing or shortness of breath. 10/20/23 09/17/24  Kingston Robes, PA-C  AMBULATORY NON FORMULARY MEDICATION rolater with seat and brakes Dispense 1 Dx code: F74.438, [id number], G89.29 Use as needed 04/03/24   Joane Artist RAMAN, MD  amLODipine  (NORVASC ) 5 MG tablet Take 1 tablet (5 mg total) by mouth daily. 11/21/24   Marialy Urbanczyk, Warren SAILOR, PA-C  cetirizine  (ZYRTEC ) 5 MG tablet Take 1 tablet (5 mg total) by mouth daily. Patient taking differently: Take 5 mg by mouth as needed. 05/31/23   Dohmeier, Dedra, MD  cyclobenzaprine  (FLEXERIL ) 10 MG tablet TAKE 1 TABLET BY MOUTH EVERY DAY AT BEDTIME 08/04/24   Purcell Emil Schanz, MD  diclofenac  Sodium (VOLTAREN ) 1 % GEL Apply 2 g topically 4 (four) times daily. 05/11/24   Ula Prentice SAUNDERS, MD  fluticasone  (FLONASE ) 50 MCG/ACT nasal spray Place 2  sprays into both nostrils daily. Patient taking differently: Place 2 sprays into both nostrils as needed. 10/20/23   Kingston Robes, PA-C  hydroxychloroquine  (PLAQUENIL ) 200 MG tablet Take 2 tablets (400 mg total) by mouth daily. 11/21/24   Jaidee Stipe, Warren SAILOR, PA-C  methotrexate (RHEUMATREX) 2.5 MG tablet Take 4 tablets (10 mg total) by mouth once a week. 11/21/24   Rafaela Dinius, Warren SAILOR, PA-C  methylPREDNISolone  (MEDROL  DOSEPAK) 4 MG TBPK tablet Sig as indicated 11/21/24   Ambika Zettlemoyer N, PA-C  metroNIDAZOLE  (FLAGYL ) 500 MG tablet Take 1 tablet (500 mg total) by mouth 2 (two) times daily. 09/18/24   Henson, Vickie L, NP-C  rizatriptan  (MAXALT -MLT) 10 MG disintegrating tablet Take 1 tablet (10 mg total) by mouth as needed for migraine. May repeat in 2 hours if needed 05/31/23   Dohmeier, Dedra, MD  venlafaxine  XR (EFFEXOR -XR) 150 MG 24 hr capsule TAKE 1 CAPSULE BY MOUTH DAILY WITH BREAKFAST. PLEASE CALL AND MAKE OVERDUE APPT FOR FURTHER REFILLS 06/01/23   Dohmeier, Dedra, MD  Vitamin D , Ergocalciferol , (DRISDOL ) 1.25 MG (50000 UNIT) CAPS capsule TAKE 1 CAPSULE (50,000 UNITS TOTAL) BY MOUTH EVERY 7 (SEVEN) DAYS Patient not taking: Reported on 09/17/2024 01/10/22   Plotnikov, Karlynn GAILS, MD    Allergies: Fish allergy, Naproxen, Other, Sulfa antibiotics, Valium  [diazepam ], Asa [aspirin], and Topamax  [topiramate ]    Review of Systems  Musculoskeletal:  Positive for arthralgias and back pain.    Updated Vital Signs  BP (!) 172/95 (BP Location: Right Arm)   Pulse 83   Temp 99.3 F (37.4 C) (Oral)   Resp 18   Ht 5' 5 (1.651 m)   Wt 86.2 kg   LMP 06/18/2021   SpO2 100%   BMI 31.62 kg/m   Physical Exam Vitals and nursing note reviewed.  Constitutional:      General: She is not in acute distress.    Appearance: She is not toxic-appearing.  HENT:     Head: Normocephalic and atraumatic.  Eyes:     General: No scleral icterus.    Conjunctiva/sclera: Conjunctivae normal.  Cardiovascular:     Rate and  Rhythm: Normal rate and regular rhythm.     Pulses: Normal pulses.     Heart sounds: Normal heart sounds.  Pulmonary:     Effort: Pulmonary effort is normal. No respiratory distress.     Breath sounds: Normal breath sounds.  Abdominal:     General: Abdomen is flat. Bowel sounds are normal.     Palpations: Abdomen is soft.     Tenderness: There is no abdominal tenderness.  Musculoskeletal:       Arms:     Right lower leg: No edema.     Left lower leg: No edema.     Comments: TTP over bilateral wrist with no focal pain, swelling or overlaying erythema. Neurovascular intact, able to move with passive ROM.  No unilateral extremity swelling or calf tenderness.   Skin:    General: Skin is warm and dry.     Findings: No lesion or rash.  Neurological:     General: No focal deficit present.     Mental Status: She is alert and oriented to person, place, and time. Mental status is at baseline.     (all labs ordered are listed, but only abnormal results are displayed) Labs Reviewed  CBC - Abnormal; Notable for the following components:      Result Value   Hemoglobin 11.3 (*)    HCT 35.8 (*)    All other components within normal limits  COMPREHENSIVE METABOLIC PANEL WITH GFR - Abnormal; Notable for the following components:   Glucose, Bld 106 (*)    All other components within normal limits  CK - Abnormal; Notable for the following components:   Total CK 35 (*)    All other components within normal limits  MAGNESIUM  URINALYSIS, ROUTINE W REFLEX MICROSCOPIC    EKG: None  Radiology: No results found.   Procedures   Medications Ordered in the ED  sodium chloride  0.9 % bolus 1,000 mL (1,000 mLs Intravenous New Bag/Given 11/21/24 0844)  dexamethasone  (DECADRON ) injection 10 mg (10 mg Intravenous Given 11/21/24 0845)  oxyCODONE  (Oxy IR/ROXICODONE ) immediate release tablet 5 mg (5 mg Oral Given 11/21/24 0845)                                    Medical Decision Making Amount  and/or Complexity of Data Reviewed Labs: ordered.  Risk Prescription drug management.   This patient presents to the ED for concern of body aches, this involves an extensive number of treatment options, and is a complaint that carries with it a high risk of complications and morbidity.  The differential diagnosis includes arthritis, septic joint, lymes disease, DVT, compartment syndrome    Lab Tests:  I personally interpreted labs.  The pertinent results include:   No leukocytosis, CMP  and magnesium unremarkable, CK is within normal limits   Cardiac Monitoring: / EKG:  The patient was maintained on a cardiac monitor.     Problem List / ED Course / Critical interventions / Medication management  Presents to emergency room with complaint of joint pain.  She reports she has had history of similar and has a history of rheumatoid arthritis.  She reports she has been off of her medicine for about a month.  On arrival she is hemodynamically stable and well-appearing.  She does not have fever and she has not had fever at home.  She does have complaint of bilateral wrist and hand pain she is neurovascularly intact without any significant erythema or edema.  She does okay with passive range of motion. Doubt DVT at this time given bilateral symptoms. Doubt septic joint given no leukocytosis, no fever.  Compartments are soft.  She also reports that she is having bilateral leg pain and right shoulder pain.  She denies injury trauma or fall.  She is tolerating oral intake.  No URI-like symptoms or nausea vomiting diarrhea. I ordered medication including Decadron , Oxycodone , NS Reevaluation of the patient after these medicines showed that the patient improved I have reviewed the patients home medicines and have made adjustments as needed 30 day refill of home medications sent, vitals are stable, well appearing, pain is well-controlled I do feel stable for discharge at this time with close outpatient  follow-up.  She follows up with rheumatology in approximately 8 days.        Final diagnoses:  Arthralgia, unspecified joint  History of rheumatoid arthritis    ED Discharge Orders          Ordered    methylPREDNISolone  (MEDROL  DOSEPAK) 4 MG TBPK tablet        11/21/24 1025    hydroxychloroquine  (PLAQUENIL ) 200 MG tablet  Daily        11/21/24 1025    methotrexate (RHEUMATREX) 2.5 MG tablet  Weekly        11/21/24 1025    oxycodone  (OXY-IR) 5 MG capsule  Every 6 hours PRN        11/21/24 1025    amLODipine  (NORVASC ) 5 MG tablet  Daily        11/21/24 1026               Shevonne Wolf N, PA-C 11/21/24 1029    Kingsley, Victoria K, DO 11/21/24 1453  "

## 2024-11-21 NOTE — ED Triage Notes (Signed)
 Patient c/o body pain, joint pain , saYs muscles contracting x 3 days Says she believes she is dehydrated  Pain rated 10/10

## 2024-11-25 ENCOUNTER — Other Ambulatory Visit: Payer: Self-pay

## 2024-11-25 DIAGNOSIS — G8929 Other chronic pain: Secondary | ICD-10-CM

## 2024-11-25 DIAGNOSIS — M170 Bilateral primary osteoarthritis of knee: Secondary | ICD-10-CM

## 2024-11-25 MED ORDER — ZILRETTA 32 MG IX SRER
32.0000 mg | Freq: Once | INTRA_ARTICULAR | 0 refills | Status: AC
  Filled 2024-11-25: qty 2, 2d supply, fill #0
  Filled 2024-11-27: qty 2, 28d supply, fill #0
  Filled 2024-11-27: qty 2, 2d supply, fill #0

## 2024-11-25 NOTE — Telephone Encounter (Signed)
 Please re-auth [name] - BILAT knees

## 2024-11-25 NOTE — Telephone Encounter (Signed)
 Just resend the medication to patients pharmacy please

## 2024-11-27 ENCOUNTER — Other Ambulatory Visit: Payer: Self-pay

## 2024-11-27 ENCOUNTER — Other Ambulatory Visit: Payer: Self-pay | Admitting: Emergency Medicine

## 2024-11-27 ENCOUNTER — Telehealth: Payer: Self-pay

## 2024-11-27 DIAGNOSIS — M5432 Sciatica, left side: Secondary | ICD-10-CM

## 2024-11-27 DIAGNOSIS — M545 Low back pain, unspecified: Secondary | ICD-10-CM

## 2024-11-27 NOTE — Progress Notes (Signed)
 Reaching out to office to see if they would like to do PA for patient

## 2024-11-27 NOTE — Telephone Encounter (Signed)
 [name]  RX was previously filled at [facility] on 08/13/24.

## 2024-11-27 NOTE — Telephone Encounter (Signed)
 Test claim for [name]  shows product not covered under patient's primary insurance. Can the office process a PA for her to see if they will cover? Thanks!

## 2024-11-28 ENCOUNTER — Ambulatory Visit: Payer: Self-pay

## 2024-11-28 ENCOUNTER — Other Ambulatory Visit: Payer: Self-pay

## 2024-11-28 ENCOUNTER — Other Ambulatory Visit (HOSPITAL_COMMUNITY): Payer: Self-pay

## 2024-11-28 ENCOUNTER — Encounter (HOSPITAL_COMMUNITY): Payer: Self-pay

## 2024-11-28 ENCOUNTER — Ambulatory Visit (HOSPITAL_COMMUNITY)
Admission: EM | Admit: 2024-11-28 | Discharge: 2024-11-28 | Disposition: A | Discharge status: Home / Self Care | Attending: Family Medicine | Admitting: Family Medicine

## 2024-11-28 ENCOUNTER — Telehealth: Payer: Self-pay | Admitting: Family Medicine

## 2024-11-28 DIAGNOSIS — M5442 Lumbago with sciatica, left side: Secondary | ICD-10-CM | POA: Diagnosis not present

## 2024-11-28 MED ORDER — CYCLOBENZAPRINE HCL 10 MG PO TABS: 10.0000 mg | ORAL_TABLET | Freq: Two times a day (BID) | ORAL | 0 refills | Status: AC | PRN

## 2024-11-28 MED ORDER — ZILRETTA 32 MG IX SRER
32.0000 mg | Freq: Once | INTRA_ARTICULAR | 0 refills | Status: AC
  Filled 2024-11-28: qty 2, 2d supply, fill #0
  Filled 2024-11-29: qty 2, 28d supply, fill #0

## 2024-11-28 NOTE — Telephone Encounter (Signed)
 [name], I just want to confirm the patients [name]  will be sent to our office, correct?

## 2024-11-28 NOTE — Telephone Encounter (Signed)
 FYI Only or Action Required?: FYI only for provider: UC advised.  Patient was last seen in primary care on 09/17/2024 by Lendia Boby CROME, NP-C.  Called Nurse Triage reporting Medication Reaction.  Symptoms began several days ago.  Interventions attempted: Nothing.  Symptoms are: unchanged.  Triage Disposition: See HCP Within 4 Hours (Or PCP Triage)  Patient/caregiver understands and will follow disposition?: Yes   Reason for Disposition  [1] Pain radiates into the thigh or further down the leg AND [2] both legs  Answer Assessment - Initial Assessment Questions Patient states she went to the ER about a week ago and was prescribed Prednisone . She has been taking the medication for 4-5 days and states that she has been experiencing this back pain after taking the medication. Last time she took medication was this morning around 9AM. UC advised.   1. ONSET: When did the pain begin? (e.g., minutes, hours, days)     4-5 days ago  2. LOCATION: Where does it hurt? (upper, mid or lower back)     Left lower back   3. SEVERITY: How bad is the pain?  (e.g., Scale 1-10; mild, moderate, or severe)     8/10  4. PATTERN: Is the pain constant? (e.g., yes, no; constant, intermittent)      States that she feels it after taking prednisone   5. RADIATION: Does the pain shoot into your legs or somewhere else?     Down legs  6. CAUSE:  What do you think is causing the back pain?      Patient is concerned she's having a reaction to Prednisone   7. BACK OVERUSE:  Any recent lifting of heavy objects, strenuous work or exercise?     No  8. MEDICINES: What have you taken so far for the pain? (e.g., nothing, acetaminophen , NSAIDS)     Nothing-worried to take anything  9. NEUROLOGIC SYMPTOMS: Do you have any weakness, numbness, or problems with bowel/bladder control?     Denies any numbness or bowel/bladder problems  10. OTHER SYMPTOMS: Do you have any other symptoms? (e.g.,  fever, abdomen pain, burning with urination, blood in urine)       No  11. PREGNANCY: Is there any chance you are pregnant? When was your last menstrual period?       Unknown  Protocols used: Back Pain-A-AH  Copied from CRM [id number]. Topic: Clinical - Red Word Triage >> Nov 28, 2024  2:27 PM Chasity T wrote: Red Word that prompted transfer to Nurse Triage: patient is having a bad reaction to prednisone  medication. States she is in extreme pain

## 2024-11-28 NOTE — ED Triage Notes (Signed)
 Patient here today with c/o left side lower back pain that radiates down her left leg with some numbness. Patient states that it started this morning at 10 am. Patient has a h/o sciatica and takes oxycodone  for knee pain.

## 2024-11-28 NOTE — Telephone Encounter (Signed)
 Correct, the pharmacy will contact pt to confirm insurance, OOP cost, and shipping. The medication will be sent to the office for administration. The pharmacy will let us  know if PA is required, or will complete PA.

## 2024-11-28 NOTE — Addendum Note (Signed)
 Addended by: MARDY LEOTIS RAMAN on: 11/28/2024 11:43 AM   Modules accepted: Orders

## 2024-11-28 NOTE — ED Provider Notes (Signed)
 " [facility]    CSN: [id number] Arrival date & time: 11/28/24  1500      History   Chief Complaint Chief Complaint  Patient presents with   Back Pain    HPI [name] is a 49 y.o. female.   The patient, with rheumatoid arthritis, presents with worsening back pain radiating down her leg.  Low back pain with radiculopathy - Worsening low back pain radiating down the leg - Pain intensified yesterday and became severe this morning - Significant discomfort and difficulty sleeping due to need for constant repositioning - Leg feels cold and numb, described as if circulation is cut off - No burning or tingling sensations - No recent falls - No bowel or bladder changes - No saddle numbness - No abdominal pain or nausea  Rheumatoid arthritis and medication use - Rheumatoid arthritis with recent ED visit on January 1 for joint pain - Currently taking prednisone , but pain worsened after starting it - Recently restarted on Plaquenil , methotrexate , and a steroid course - Oxycodone  prescribed for pain management  Previous therapies and medication adherence - Physical therapy in the past worsened symptoms - Cyclobenzaprine  previously provided partial relief by relaxing muscles - Currently out of Effexor  and awaiting a refill  The history is provided by the patient.  Back Pain   Past Medical History:  Diagnosis Date   Arthritis    Osto   Constipation    GERD (gastroesophageal reflux disease)    Hypertension    Iron deficiency anemia    Migraine    Migraine headache with aura 11/09/2020   Sarcoidosis    Vertigo     Patient Active Problem List   Diagnosis Date Noted   Rheumatoid arthritis (HCC) 08/26/2024   Left sided sciatica 07/08/2024   Lumbar pain 07/08/2024   Baker's cyst of knee, right 04/04/2024   Non-seasonal allergic rhinitis due to pollen 05/31/2023   Intermittent asthma without complication 05/31/2023   Episodic recurrent vertigo 04/06/2022    Tobacco dependence due to cigarettes 02/06/2022   Inadequate sleep hygiene 02/06/2022   Morning headache 02/06/2022   Foot pain, bilateral 12/16/2021   Snoring 12/16/2021   Convulsions (HCC) 03/12/2021   Migraine headache with aura 11/09/2020   Degenerative arthritis of knee, bilateral 10/07/2019   Chronic pain of left ankle 04/13/2018   Vitamin D  deficiency 07/28/2016   Routine general medical examination at a health care facility 04/25/2016   Obesity 04/25/2016   Anemia 09/28/2014   Thrombocytopenia 09/28/2014    Past Surgical History:  Procedure Laterality Date   CESAREAN SECTION     CESAREAN SECTION     KNEE SURGERY Right    ACL   steroid shots     TUBAL LIGATION      OB History   No obstetric history on file.      Home Medications    Prior to Admission medications  Medication Sig Start Date End Date Taking? Authorizing Provider  cyclobenzaprine  (FLEXERIL ) 10 MG tablet Take 1 tablet (10 mg total) by mouth 2 (two) times daily as needed for up to 20 doses for muscle spasms. 11/28/24  Yes Alba Sharper, MD  folic acid (FOLVITE) 1 MG tablet Take 1 mg by mouth daily. 04/30/24  Yes [provider]  albuterol  (VENTOLIN  HFA) 108 (90 Base) MCG/ACT inhaler Inhale 1-2 puffs into the lungs every 6 (six) hours as needed for wheezing or shortness of breath. 10/20/23 09/17/24  Kingston Robes, PA-C  AMBULATORY NON FORMULARY MEDICATION rolater  with seat and brakes Dispense 1 Dx code: F74.438, M25.562, G89.29 Use as needed 04/03/24   Joane Artist RAMAN, MD  amLODipine  (NORVASC ) 5 MG tablet Take 1 tablet (5 mg total) by mouth daily. 11/21/24   Barrett, Warren SAILOR, PA-C  cetirizine  (ZYRTEC ) 5 MG tablet Take 1 tablet (5 mg total) by mouth daily. Patient taking differently: Take 5 mg by mouth as needed. 05/31/23   Dohmeier, Dedra, MD  diclofenac  Sodium (VOLTAREN ) 1 % GEL Apply 2 g topically 4 (four) times daily. 05/11/24   Ula Prentice SAUNDERS, MD  fluticasone  (FLONASE ) 50 MCG/ACT nasal spray  Place 2 sprays into both nostrils daily. Patient taking differently: Place 2 sprays into both nostrils as needed. 10/20/23   Kingston Robes, PA-C  hydroxychloroquine  (PLAQUENIL ) 200 MG tablet Take 2 tablets (400 mg total) by mouth daily. 11/21/24   Barrett, Warren SAILOR, PA-C  methotrexate  (RHEUMATREX) 2.5 MG tablet Take 4 tablets (10 mg total) by mouth once a week. 11/21/24   Barrett, Jamie N, PA-C  Olopatadine HCl 0.2 % SOLN Apply 1 drop to eye daily.    [provider]  oxycodone  (OXY-IR) 5 MG capsule Take 1 capsule (5 mg total) by mouth every 6 (six) hours as needed (severe pain, breakthrough pain). 11/21/24   Barrett, Warren SAILOR, PA-C  rizatriptan  (MAXALT -MLT) 10 MG disintegrating tablet Take 1 tablet (10 mg total) by mouth as needed for migraine. May repeat in 2 hours if needed 05/31/23   Dohmeier, Dedra, MD  Triamcinolone  Acetonide ([name] ) 32 MG SRER intra-articular injection Inject 5 mLs (32 mg total) into the articular space once for 1 dose. Bilateral knees 11/28/24 11/29/24  Corey, Evan S, MD  venlafaxine  XR (EFFEXOR -XR) 150 MG 24 hr capsule TAKE 1 CAPSULE BY MOUTH DAILY WITH BREAKFAST. PLEASE CALL AND MAKE OVERDUE APPT FOR FURTHER REFILLS 06/01/23   Dohmeier, Dedra, MD  Vitamin D , Ergocalciferol , (DRISDOL ) 1.25 MG (50000 UNIT) CAPS capsule TAKE 1 CAPSULE (50,000 UNITS TOTAL) BY MOUTH EVERY 7 (SEVEN) DAYS Patient not taking: Reported on 09/17/2024 01/10/22   Plotnikov, Karlynn GAILS, MD    Family History Family History  Problem Relation Age of Onset   Lupus Mother    Healthy Father    Thyroid  disease Sister    Anemia Sister    Heart failure Maternal Grandmother    Diabetes Maternal Grandmother    Hypertension Maternal Grandmother    Thyroid  disease Maternal Grandmother    Diabetes Maternal Grandfather    Diabetes Paternal Grandmother    Colon polyps Neg Hx    Colon cancer Neg Hx    Esophageal cancer Neg Hx    Stomach cancer Neg Hx    Rectal cancer Neg Hx     Social History Social  History[1]   Allergies   Fish allergy, Naproxen, Other, Sulfa antibiotics, Valium  [diazepam ], Asa [aspirin], and Topamax  [topiramate ]   Review of Systems Review of Systems  Musculoskeletal:  Positive for back pain.     Physical Exam Triage Vital Signs ED Triage Vitals  Encounter Vitals Group     BP      Girls Systolic BP Percentile      Girls Diastolic BP Percentile      Boys Systolic BP Percentile      Boys Diastolic BP Percentile      Pulse      Resp      Temp      Temp src      SpO2      Weight  Height      Head Circumference      Peak Flow      Pain Score      Pain Loc      Pain Education      Exclude from Growth Chart    No data found.  Updated Vital Signs BP 138/83 (BP Location: Left Arm)   Pulse 62   Temp 98.4 F (36.9 C) (Oral)   Resp 16   LMP 06/18/2021   SpO2 98%   Visual Acuity Right Eye Distance:   Left Eye Distance:   Bilateral Distance:    Right Eye Near:   Left Eye Near:    Bilateral Near:     Physical Exam Vitals reviewed.  Constitutional:      General: She is not in acute distress.    Appearance: Normal appearance. She is not ill-appearing.  HENT:     Head: Normocephalic and atraumatic.     Nose: Nose normal.     Mouth/Throat:     Mouth: Mucous membranes are moist.  Eyes:     Extraocular Movements: Extraocular movements intact.  Cardiovascular:     Rate and Rhythm: Normal rate and regular rhythm.  Pulmonary:     Effort: Pulmonary effort is normal.     Breath sounds: Normal breath sounds.  Abdominal:     General: Abdomen is flat.  Musculoskeletal:     Cervical back: Normal range of motion.  Skin:    General: Skin is warm and dry.  Neurological:     General: No focal deficit present.     Mental Status: She is alert.     Comments: No obvious deformity, swelling, erythema Tender to palpation over the left lower lumbar paraspinal muscles Full range of motion forward flexion of the back, extension limited due to pain,  lateral flexion and left and right rotation symmetric both directions but limited secondary to pain Strength 5 out of 5 bilateral lower extremities and L1 S5 nerve distribution Positive straight leg raise on the left, negative on the right  Psychiatric:        Mood and Affect: Mood normal.      UC Treatments / Results  Labs (all labs ordered are listed, but only abnormal results are displayed) Labs Reviewed - No data to display  EKG   Radiology No results found.  Procedures Procedures (including critical care time)  Medications Ordered in UC Medications - No data to display  Initial Impression / Assessment and Plan / UC Course  I have reviewed the triage vital signs and the nursing notes.  Pertinent labs & imaging results that were available during my care of the patient were reviewed by me and considered in my medical decision making (see chart for details).     Consistent with acute on chronic flare of sciatica in the setting of rheumatoid arthritis.  No red flag symptoms for lower lumbar back pain.  Per chart review recent ED generalized joint pain visit patient was prescribed steroids, oxycodone , and refills of her methotrexate  and Plaquenil  were restarted.  Given this I would avoid further administration of steroids for her.  Given prior benefit from Flexeril  will represcribe this for her on an as needed basis.  I explained that she would need to follow-up with her primary care doctor soon to definitively manage her likely chronic pain. I suspect that ultimately intensive stretching and rehabilitation of her lower back musculature will improve her pain long-term more effectively than other treatments.  Stable  for discharge.   Final Clinical Impressions(s) / UC Diagnoses   Final diagnoses:  Acute left-sided low back pain with left-sided sciatica     Discharge Instructions      Your lower back pain is being caused by sciatica. I have sent the medicine Flexeril  to  your pharmacy. You can take this twice daily as needed for your back pain. You need to follow-up with your primary care doctor or sports medicine doctor for further pain management about your back pain. Please make sure to also follow-up with your rheumatologist for your rheumatoid arthritis. If you have any new or worsening symptoms please do not hesitate to go to the emergency room.     ED Prescriptions     Medication Sig Dispense Auth. Provider   cyclobenzaprine  (FLEXERIL ) 10 MG tablet Take 1 tablet (10 mg total) by mouth 2 (two) times daily as needed for up to 20 doses for muscle spasms. 20 tablet Marelyn Rouser, MD      I have reviewed the PDMP during this encounter.    [1]  Social History Tobacco Use   Smoking status: Every Day    Current packs/day: 1.00    Average packs/day: 1 pack/day for 17.0 years (17.0 ttl pk-yrs)    Types: Cigarettes   Smokeless tobacco: Never  Vaping Use   Vaping status: Never Used  Substance Use Topics   Alcohol use: Not Currently   Drug use: No     Alba Sharper, MD 11/28/24 1645  "

## 2024-11-28 NOTE — ED Provider Notes (Signed)
 I have interviewed and examined this patient, and I agree with the resident's assessment and plan. She will be seeing rheumatology tomorrow   Vonna Sharlet POUR, MD 11/28/24 [phone]

## 2024-11-28 NOTE — Telephone Encounter (Signed)
 Patient called and her pharmacy has the zilretta  injection. Patient does not know what she needs to do next. Please advise.

## 2024-11-28 NOTE — Discharge Instructions (Addendum)
 Your lower back pain is being caused by sciatica. I have sent the medicine Flexeril  to your pharmacy. You can take this twice daily as needed for your back pain. You need to follow-up with your primary care doctor or sports medicine doctor for further pain management about your back pain. Please make sure to also follow-up with your rheumatologist for your rheumatoid arthritis. If you have any new or worsening symptoms please do not hesitate to go to the emergency room.

## 2024-11-29 ENCOUNTER — Other Ambulatory Visit: Payer: Self-pay

## 2024-11-29 ENCOUNTER — Other Ambulatory Visit (HOSPITAL_COMMUNITY): Payer: Self-pay

## 2024-11-29 NOTE — Progress Notes (Signed)
 Initial fill has been scheduled in [place].

## 2024-11-29 NOTE — Progress Notes (Signed)
 Specialty Pharmacy Refill Coordination Note  [name] is a 49 y.o. female contacted today regarding refills of specialty medication(s) Triamcinolone  Acetonide ([name] )   Patient requested Courier to Provider Office   Delivery date: 12/03/24   Verified address: [facility] [address]   Medication will be filled on: 12/02/24

## 2024-11-29 NOTE — Telephone Encounter (Signed)
 Per pharmacy note today:  Specialty Pharmacy Refill Coordination Note   [name] is a 49 y.o. female contacted today regarding refills of specialty medication(s) Triamcinolone  Acetonide ([name] )     Patient requested Courier to Provider Office   Delivery date: 12/03/24   Verified address: [facility] at [facility] [address]     Medication will be filled on: 12/02/24

## 2024-11-29 NOTE — Telephone Encounter (Signed)
 I had one heck of a time trying to do this electronically, however I called number on the back of card for pharmacy pre cert's. I talked to [place] intake representative for [facility] and started the prior Auth process.  Pending Auth number [id number]  They will fax determination letter and stated will take up to 5 business days.

## 2024-11-29 NOTE — Telephone Encounter (Signed)
 Never mind on all of that then. Patient will get the medication for 4 dollars through [employer] insurance.

## 2024-11-29 NOTE — Progress Notes (Signed)
 Patient now only has [employer]. Test claim shows $4 copay

## 2024-12-02 ENCOUNTER — Other Ambulatory Visit: Payer: Self-pay

## 2024-12-03 NOTE — Telephone Encounter (Signed)
 Scheduled.

## 2024-12-03 NOTE — Telephone Encounter (Signed)
 [name]  has been received from the pharmacy. Please reach out to assist pt with scheduling visit for bilat [name]  injections.   Thanks!

## 2024-12-12 ENCOUNTER — Other Ambulatory Visit: Payer: Self-pay

## 2024-12-12 ENCOUNTER — Ambulatory Visit: Admitting: Family Medicine

## 2024-12-12 VITALS — BP 118/80 | HR 78 | Ht 65.0 in

## 2024-12-12 DIAGNOSIS — M25562 Pain in left knee: Secondary | ICD-10-CM

## 2024-12-12 DIAGNOSIS — G8929 Other chronic pain: Secondary | ICD-10-CM

## 2024-12-12 DIAGNOSIS — M25552 Pain in left hip: Secondary | ICD-10-CM

## 2024-12-12 DIAGNOSIS — M25561 Pain in right knee: Secondary | ICD-10-CM | POA: Diagnosis not present

## 2024-12-12 DIAGNOSIS — M170 Bilateral primary osteoarthritis of knee: Secondary | ICD-10-CM

## 2024-12-12 MED ORDER — TRIAMCINOLONE ACETONIDE 32 MG IX SRER
32.0000 mg | Freq: Once | INTRA_ARTICULAR | Status: AC
  Administered 2024-12-12: 32 mg via INTRA_ARTICULAR

## 2024-12-12 NOTE — Progress Notes (Signed)
 "        I, Ileana Collet, PhD, LAT, ATC acting as a scribe for Artist Lloyd, MD.  Sharene JONETTA Razor is a 49 y.o. female who presents to [facility] today for exacerbation of her bilat knee pain. Pt was last seen by Dr. Lloyd on 08/30/24 and both knees were injected w/ [name] .   Today, pt reports she went to a family member's homecoming event and did too much walking. Pt woke up in the middle of the night w/ R hip pain. Pain is located along the lateral aspect of her R hip.   Dx testing: 04/16/24 L Vasc US  04/03/24 Labs             04/01/24 R knee MRI 02/14/24 R & L knee XR 09/04/23 Labs  Pertinent review of systems: No fevers or chills  Relevant historical information: Rheumatoid arthritis.  Recently was out of medicine and had a flareup of pain.  Back on her medicine now and is starting to feel better.   Exam:  BP 118/80   Pulse 78   Ht 5' 5 (1.651 m)   LMP 06/18/2021   SpO2 96%   BMI 31.62 kg/m  General: Well Developed, well nourished, and in no acute distress.   MSK: Knees bilaterally moderate effusion.  Decreased range of motion.  Antalgic gait.  Left hip tender palpation greater trochanter.    Lab and Radiology Results   [name]  injection bilateral knee Procedure: Real-time Ultrasound Guided Injection of right knee joint superior lateral patellar space Device: Philips Affiniti 50G Images permanently stored and available for review in PACS Verbal informed consent obtained.  Discussed risks and benefits of procedure. Warned about infection, hyperglycemia bleeding, damage to structures among others. Patient expresses understanding and agreement Time-out conducted.   Noted no overlying erythema, induration, or other signs of local infection.   Skin prepped in a sterile fashion.   Local anesthesia: Topical Ethyl chloride.   With sterile technique and under real time ultrasound guidance: [name]  32 mg injected into knee joint. Fluid seen  entering the joint capsule.   Completed without difficulty   Advised to call if fevers/chills, erythema, induration, drainage, or persistent bleeding.   Images permanently stored and available for review in the ultrasound unit.  Impression: Technically successful ultrasound guided injection.  Procedure: Real-time Ultrasound Guided Injection of left knee joint superior lateral patellar space Device: Philips Affiniti 50G Images permanently stored and available for review in PACS Verbal informed consent obtained.  Discussed risks and benefits of procedure. Warned about infection, hyperglycemia bleeding, damage to structures among others. Patient expresses understanding and agreement Time-out conducted.   Noted no overlying erythema, induration, or other signs of local infection.   Skin prepped in a sterile fashion.   Local anesthesia: Topical Ethyl chloride.   With sterile technique and under real time ultrasound guidance: [name]  32 mg injected into knee joint. Fluid seen entering the joint capsule.   Completed without difficulty   Advised to call if fevers/chills, erythema, induration, drainage, or persistent bleeding.   Images permanently stored and available for review in the ultrasound unit.  Impression: Technically successful ultrasound guided injection.  Lot number: [id number] Of note patient provided her own [name]  through her pharmacy benefit.  No charge for the [name]  component for today's injection.   Assessment and plan: 49 year old woman with bilateral knee pain due to exacerbation of arthritis.  Plan for [name]  injection today both knees.  She does have some left  lateral hip pain.  She declined offered steroid injection for fear of steroid side effects which is very reasonable.  I think a lot of her pain is exacerbation of DJD as well as exacerbation of rheumatoid arthritis.  She is back on her rheumatologic medications now and feeling a little bit better which is reassuring.   Could proceed with a greater trochanter injection left hip whenever she needs me to.  PDMP not reviewed this encounter. Orders Placed This Encounter  Procedures   US  LIMITED JOINT SPACE STRUCTURES LOW BILAT(NO LINKED CHARGES)    Reason for Exam (SYMPTOM  OR DIAGNOSIS REQUIRED):   bilateral knee OA    Preferred imaging location?:   [facility] ordered this encounter  Medications   Triamcinolone  Acetonide ([name] ) intra-articular injection 32 mg   Triamcinolone  Acetonide ([name] ) intra-articular injection 32 mg     Discussed warning signs or symptoms. Please see discharge instructions. Patient expresses understanding.   The above documentation has been reviewed and is accurate and complete Artist Lloyd, M.D.   "

## 2024-12-12 NOTE — Patient Instructions (Addendum)
 Thank you for coming in today.   You received an injection today. Seek immediate medical attention if the joint becomes red, extremely painful, or is oozing fluid.   Let me know if your hip doesn't settle down  I can repeat these [name]  shots in your knee in 3 months, if needed.

## 2024-12-23 ENCOUNTER — Other Ambulatory Visit: Payer: Self-pay

## 2024-12-24 ENCOUNTER — Other Ambulatory Visit: Payer: Self-pay
